# Patient Record
Sex: Female | Born: 1970
Health system: Southern US, Community
[De-identification: ages and names within clinical notes are randomized; demographics above are authoritative.]

## PROBLEM LIST (undated history)

## (undated) DIAGNOSIS — F319 Bipolar disorder, unspecified: Secondary | ICD-10-CM

## (undated) DIAGNOSIS — F259 Schizoaffective disorder, unspecified: Secondary | ICD-10-CM

## (undated) DIAGNOSIS — G2581 Restless legs syndrome: Secondary | ICD-10-CM

## (undated) HISTORY — DX: Schizoaffective disorder, unspecified: F25.9

## (undated) HISTORY — PX: APPENDECTOMY: SHX54

---

## 2001-07-17 ENCOUNTER — Other Ambulatory Visit: Admission: RE | Admit: 2001-07-17 | Discharge: 2001-07-17 | Payer: Self-pay | Admitting: Obstetrics and Gynecology

## 2006-10-18 ENCOUNTER — Inpatient Hospital Stay: Payer: Self-pay | Admitting: Psychiatry

## 2008-04-11 ENCOUNTER — Emergency Department: Payer: Self-pay | Admitting: Emergency Medicine

## 2009-09-21 ENCOUNTER — Emergency Department: Payer: Self-pay | Admitting: Emergency Medicine

## 2009-09-22 ENCOUNTER — Inpatient Hospital Stay: Payer: Self-pay | Admitting: Psychiatry

## 2012-03-11 ENCOUNTER — Emergency Department: Payer: Self-pay | Admitting: Emergency Medicine

## 2012-03-11 LAB — COMPREHENSIVE METABOLIC PANEL
Albumin: 4.3 g/dL (ref 3.4–5.0)
Anion Gap: 7 (ref 7–16)
Bilirubin,Total: 0.3 mg/dL (ref 0.2–1.0)
Chloride: 109 mmol/L — ABNORMAL HIGH (ref 98–107)
Creatinine: 0.89 mg/dL (ref 0.60–1.30)
EGFR (African American): >60
Osmolality: 276 (ref 275–301)
Potassium: 3.8 mmol/L (ref 3.5–5.1)
SGOT(AST): 24 U/L (ref 15–37)
Sodium: 139 mmol/L (ref 136–145)
Total Protein: 8.3 g/dL — ABNORMAL HIGH (ref 6.4–8.2)

## 2012-03-11 LAB — URINALYSIS, COMPLETE
Bilirubin,UR: NEGATIVE
Ketone: NEGATIVE
Protein: NEGATIVE
Squamous Epithelial: 4
WBC UR: <1 /HPF (ref 0–5)

## 2012-03-11 LAB — CBC
HGB: 15.7 g/dL (ref 12.0–16.0)
MCHC: 34.3 g/dL (ref 32.0–36.0)
MCV: 90 fL (ref 80–100)
Platelet: 370 10*3/uL (ref 150–440)
RBC: 5.08 10*6/uL (ref 3.80–5.20)
RDW: 13.6 % (ref 11.5–14.5)
WBC: 8.9 10*3/uL (ref 3.6–11.0)

## 2012-03-11 LAB — ETHANOL
Ethanol %: <0.003 % (ref 0.000–0.080)
Ethanol: <3 mg/dL

## 2012-03-11 LAB — DRUG SCREEN, URINE
Amphetamines, Ur Screen: NEGATIVE (ref ?–1000)
Barbiturates, Ur Screen: NEGATIVE (ref ?–200)
Cocaine Metabolite,Ur ~~LOC~~: NEGATIVE (ref ?–300)
Methadone, Ur Screen: NEGATIVE (ref ?–300)
Opiate, Ur Screen: NEGATIVE (ref ?–300)
Tricyclic, Ur Screen: NEGATIVE (ref ?–1000)

## 2012-03-11 LAB — TSH: Thyroid Stimulating Horm: 2.71 u[IU]/mL

## 2012-03-11 LAB — LITHIUM LEVEL: Lithium: <0.2 mmol/L — ABNORMAL LOW

## 2013-08-22 ENCOUNTER — Inpatient Hospital Stay: Payer: Self-pay | Admitting: Psychiatry

## 2013-08-22 LAB — COMPREHENSIVE METABOLIC PANEL
ANION GAP: 5 — AB (ref 7–16)
Albumin: 3.9 g/dL (ref 3.4–5.0)
Alkaline Phosphatase: 81 U/L
BILIRUBIN TOTAL: 0.3 mg/dL (ref 0.2–1.0)
BUN: 10 mg/dL (ref 7–18)
CREATININE: 0.77 mg/dL (ref 0.60–1.30)
Calcium, Total: 9 mg/dL (ref 8.5–10.1)
Chloride: 108 mmol/L — ABNORMAL HIGH (ref 98–107)
Co2: 27 mmol/L (ref 21–32)
EGFR (Non-African Amer.): >60
Glucose: 117 mg/dL — ABNORMAL HIGH (ref 65–99)
OSMOLALITY: 279 (ref 275–301)
POTASSIUM: 3.9 mmol/L (ref 3.5–5.1)
SGOT(AST): 26 U/L (ref 15–37)
SGPT (ALT): 30 U/L (ref 12–78)
Sodium: 140 mmol/L (ref 136–145)
TOTAL PROTEIN: 7.7 g/dL (ref 6.4–8.2)

## 2013-08-22 LAB — CBC
HCT: 43.3 % (ref 35.0–47.0)
HGB: 14.6 g/dL (ref 12.0–16.0)
MCH: 30.4 pg (ref 26.0–34.0)
MCHC: 33.8 g/dL (ref 32.0–36.0)
MCV: 90 fL (ref 80–100)
PLATELETS: 340 10*3/uL (ref 150–440)
RBC: 4.82 10*6/uL (ref 3.80–5.20)
RDW: 13.7 % (ref 11.5–14.5)
WBC: 9.3 10*3/uL (ref 3.6–11.0)

## 2013-08-22 LAB — DRUG SCREEN, URINE
Amphetamines, Ur Screen: NEGATIVE
Barbiturates, Ur Screen: NEGATIVE
Benzodiazepine, Ur Scrn: NEGATIVE
Cannabinoid 50 Ng, Ur ~~LOC~~: NEGATIVE
Cocaine Metabolite,Ur ~~LOC~~: NEGATIVE
MDMA (Ecstasy)Ur Screen: NEGATIVE
Methadone, Ur Screen: NEGATIVE
Opiate, Ur Screen: NEGATIVE
Phencyclidine (PCP) Ur S: NEGATIVE
Tricyclic, Ur Screen: NEGATIVE

## 2013-08-22 LAB — TSH: Thyroid Stimulating Horm: 0.94 u[IU]/mL

## 2013-08-22 LAB — SALICYLATE LEVEL: Salicylates, Serum: 6.5 mg/dL — ABNORMAL HIGH

## 2013-08-22 LAB — ETHANOL: Ethanol %: <0.003 % (ref 0.000–0.080)

## 2013-08-22 LAB — ACETAMINOPHEN LEVEL: Acetaminophen: <2 ug/mL

## 2014-09-27 NOTE — Consult Note (Signed)
PATIENT NAMEPERNELL, [name] MR#:  [id number] DATE OF BIRTH:  07/11/1970  DATE OF CONSULTATION:  08/22/2013  CONSULTING PHYSICIAN:  Jewelz Kobus S. Gretel Acre, MD  REQUESTED BY:  Brenton Grills, MD  REASON FOR CONSULT: Not sleeping for the past 2 days.   HISTORY OF PRESENT ILLNESS: The patient is a 44 year old, married female who presented to the ER at the request of Dr. Nicolasa Ducking, who is her outpatient psychiatrist, as patient has not been sleeping for the past 2 days. She reported that she has been following Dr. Nicolasa Ducking for the past one year. She has history of undisclosed PTSD and undisclosed episodic psychotic disorder. During my interview, patient reported that she has been having problems with sleeping, as she was thinking about different situations and things she created. She reported that she has been feeling depressed, and has some irrational drama, which she has created. However, she was not able to participate much in the interview, and did not provide much information.   Collateral information was obtained from Dr. Nicolasa Ducking. She reported that patient has history of several hospitalizations in the past. She has been working at [employer] for several years, and then she had a traumatic experience over there. She was diagnosed with PTSD due to the same. After that, she has mild paranoia and she was unable to continue her job. The patient stabilized on Seroquel in the past, but the medication was stopped as patient was doing very well on the medication. After patient stopped the Seroquel, she started having worsening of her symptoms. Dr. Nicolasa Ducking stabilized her on lamotrigine and Celexa, but patient started becoming more paranoid, not able to sleep, and her family is very supportive. The patient feels that she is part of a reality TV show at this time. She is becoming more paranoid, delusional, and having negative thoughts about herself. Her family is not disappointed about her, but patient has negative feelings and  she is becoming more restless. She is unable to contract for safety.   PAST PSYCHIATRIC HISTORY: The patient has history of multiple psychiatric hospitalizations starting at age 48. Her second hospitalization was in 2008, when she was given a combination of lithium, Depakote and Abilify. She was diagnosed with bipolar disorder. She was also admitted to [facility] when she tried to kill herself by wrapping a sheet around her neck. She was also tried on a combination of Trileptal, Zyprexa, Depakote and she responded well to a combination of Seroquel. She is currently following Dr. Nicolasa Ducking for the past one year.   PAST MEDICAL HISTORY: Restless legs syndrome.   ALLERGIES: No known drug allergies.   FAMILY PSYCHIATRIC HISTORY: No history of psychiatric illness in the family.   CURRENT MEDICATIONS:  1.  Lamotrigine XR 300 mg daily.  2.  Celexa 20 mg p.o. daily.   SOCIAL HISTORY: The patient is currently married and lives with her husband. He is very supportive. She is currently unemployed.   FAMILY HISTORY: Not known.   CLINICAL SUMMARY: Temperature 97.9, pulse 101, respirations 22, blood pressure 121/58.  LABORATORIES: Glucose 117, BUN 10, creatinine 0.77, sodium 140, potassium 3.9, chloride 108, bicarbonate 27, anion gap 5, osmolality 279, calcium 9.0. Blood alcohol level less than 3. Protein 7.7, albumin 3.9, bilirubin 0.3, alkaline phosphatase 81, AST 26, ALT 30. TSH 0.94. UDS is negative. WBC 9.3, RBC 4.82, hemoglobin 14.6, hematocrit 43.3, MCV 90, RDW 13.7.    REVIEW OF SYSTEMS: CONSTITUTIONAL: Denies any fever or chills. No weight changes.  EYES: No  double or blurred vision.  RESPIRATORY: No shortness of breath or cough.  CARDIOVASCULAR: No chest pain or orthopnea.  GASTROINTESTINAL: No abdominal pain, nausea, vomiting or diarrhea.  GENITOURINARY: No incontinence or frequency.  ENDOCRINE: No heat or cold intolerance.  LYMPHATIC: No anemia or easy bruising.  INTEGUMENTARY: No acne or  rash.  MUSCULOSKELETAL: No muscle or joint pain.  NEUROLOGIC: No tingling or weakness.   MENTAL STATUS EXAMINATION: The patient is a moderately-built female who was lying in the bed. She was hiding her face under the cover. She maintained fair eye contact. Her speech was low in tone and volume. Mood was depressed and anxious. Affect was congruent. Thought process tangential. Thought content was non-delusional. She has poor insight and judgment. She did not participate much in the interview. She was unable to contract for safety at this time.   DIAGNOSTIC IMPRESSION: AXIS I: Schizoaffective disorder, bipolar type.  AXIS II: None.  AXIS III: Please review the medical history.   TREATMENT PLAN: 1.  The patient is currently on involuntary commitment, and will be admitted to the behavioral health unit for stabilization and safety.   2.  I have discussed her case with Dr. Nicolasa Ducking, and will start her on Zyprexa 5 mg p.o. at bedtime.   3.  I will also continue the lamotrigine 100 mg p.o. b.i.d.   4.  She will be evaluated by Dr. Bary Leriche,  and her medications will be adjusted.   Thank you for allowing me to participate in the care of this patient.     ____________________________ Cordelia Pen. Gretel Acre, MD usf:mr D: 08/22/2013 15:39:00 ET T: 08/22/2013 19:05:07 ET JOB#: [id number]  cc: Cordelia Pen. Gretel Acre, MD, <Dictator> Jeronimo Norma MD ELECTRONICALLY SIGNED 08/27/2013 14:00

## 2014-09-27 NOTE — H&P (Signed)
PATIENT NAMELASHAWNTA, [name] MR#:  [id number] DATE OF BIRTH:  08-25-1970  DATE OF ADMISSION:  08/22/2013  EVALUATED ON: 08/23/2013   REFERRING PHYSICIAN: Emergency Room M.D.   ATTENDING PHYSICIAN: [name] B. Bary Leriche, M.D.   IDENTIFYING DATA: [name] is a 44 year old female with history of bipolar disorder.   CHIEF COMPLAINT: " I am losing the house."   HISTORY OF PRESENT ILLNESS: [name] has frequent episodes of psychotic disorganization and despair as a part of her bipolar illness. She has been in the care of Dr. Nicolasa Ducking and maintained on Lamictal with excellent results; however, in the past week or so, the patient again became increasingly paranoid and disorganized. She fears that her husband is going to die, that the family is going to lose her house. She is crying that [facility] is a group home and this is where she will have to move if we will not discharge her to home immediately. She feels that she put all of her family in trouble, that she does some shameful things for which everybody is now going to pay a price. The patient presented several times with exactly the same scenario to [facility] as well as [facility]. She is extremely tearful, guilt ridden and unable to function. She has not been able to sleep. She has been brought to the hospital by her husband who has been very supportive. She denies any symptoms whatsoever. She does not recognize that she is sick. She denies alcohol or illicit substance use.   PAST PSYCHIATRIC HISTORY: Seven hospitalizations with similar presentation. She was given diagnosis of major depressive disorder with psychotic features as well as bipolar disorder, depressed with psychosis. She has been tried on multiple medications; lithium, Depakote, Abilify, Tegretol, Requip, Trilafon she did well on, Lamictal. She cannot tolerate Geodon or Seroquel. There were no suicide attempts.   FAMILY PSYCHIATRIC HISTORY: None reported.   PAST MEDICAL  HISTORY: Restless legs.   ALLERGIES: No known drug allergies.   MEDICATIONS ON ADMISSION: Lamictal XR 300 mg daily, Celexa 20 mg daily.   SOCIAL HISTORY: She is married. She lives with her husband. She has 2 adult children. She lost multiple employment opportunities when she becomes paranoid, twice at [employer] and at [employer]. She is not employed at the moment. She oftentimes had to switch treatments due to loss of insurance. Her husband owns a business and so far has been able to support her.   REVIEW OF SYSTEMS:  CONSTITUTIONAL: No fevers or chills. No weight changes.  EYES: No double or blurred vision.  ENT: No hearing loss.  RESPIRATORY: No shortness of breath or cough.  CARDIOVASCULAR: No chest pain or orthopnea.  GASTROINTESTINAL: No abdominal pain, nausea, vomiting or diarrhea.  GENITOURINARY: No incontinence or frequency.  ENDOCRINE: No heat or cold intolerance.  LYMPHATIC: No anemia or easy bruising.  INTEGUMENTARY: No acne or rash.  MUSCULOSKELETAL: No muscle or joint pain.  NEUROLOGIC: No tingling or weakness.  PSYCHIATRIC: See history of present illness for details.   PHYSICAL EXAMINATION:  VITAL SIGNS: Blood pressure 136/82, pulse 87, respirations 18, temperature 99.  GENERAL: This is a slightly obese female, restless.   HEENT: The pupils are equal, round and reactive to light. Sclerae anicteric.  NECK: Supple. No thyromegaly.  LUNGS: Clear to auscultation. No dullness to percussion.  HEART: Regular rhythm and rate. No murmurs, rubs or gallops.  ABDOMEN: Soft, nontender and nondistended. Positive bowel sounds.  MUSCULOSKELETAL: Normal muscle strength in all extremities.  SKIN: No rashes or bruises.  LYMPHATIC: No cervical adenopathy.  NEUROLOGIC: Cranial nerves II through XII are intact.   LABORATORY DATA: Chemistries within normal limits with blood glucose of 117. Blood alcohol level is 0. LFTs within normal limits. TSH 0.94. Urine tox screen negative for substances.  CBC within normal limits. Serum acetaminophen and salicylates are low.   MENTAL STATUS EXAMINATION ON ADMISSION: The patient is alert and oriented to person, place and somewhat to situation. She is in despair. She is crying, sobbing. Demanding to be discharged. She is restless, pacing continuously in the hallways and also in my office, unable to sit down. She slept 1 hour last night. She maintains fairly good eye contact. She is marginally groomed. Her speech is loud at times. Mood is depressed with labile affect. Thought process is not logical. She denies suicidal or homicidal ideation but feels hopeless and in despair, extremely guilty. There are no thoughts of hurting others. She is paranoid and delusional. She denies hallucinations. Her cognition is grossly intact. She is not a good historian at the moment due to psychotic disorganization. She seems of normal intelligence and normal fund of knowledge. Her insight and judgment are currently extremely poor.   SUICIDE RISK ASSESSMENT ON ADMISSION: This is a patient with history of depression, psychosis, mood instability, treatment noncompliance who became floridly psychotic in spite of recent medication adjustment and possibly fair medication compliance.   DIAGNOSES:  AXIS I: Bipolar affective disorder, mixed with psychotic features.  AXIS II: Deferred.  AXIS III: Restless legs.  AXIS IV: Mental illness.  AXIS V: Global assessment of functioning on admission 25.   PLAN: The patient was admitted to [facility] for safety, stabilization and medication management. She was initially placed on suicide precautions and was closely monitored for any unsafe behaviors. She underwent full psychiatric and risk assessment. She received pharmacotherapy, individual and group psychotherapy, substance abuse counseling and support from therapeutic milieu.  1. Mood and psychosis: We will continue Lamictal. We started Zyprexa  last night and will increase the dose potentially to 20 mg daily, and we will offer temazepam for sleep.  2. Restless legs: The patient is really struggling. I hope that her restlessness does not come from Zyprexa. Will offer propranolol and also low-dose Requip that was helpful in the past; however, at some point, the patient was taking 5 mg of Requip and we were worried that this could have precipitated a manic episode.  3. Insomnia: We will start Restoril.  4. Disposition: She will be discharged to home.    ____________________________ Wardell Honour. Bary Leriche, MD jbp:gb D: 08/23/2013 22:07:57 ET T: 08/23/2013 22:24:50 ET JOB#: [id number]  cc: Sharry Beining B. Bary Leriche, MD, <Dictator> Clovis Fredrickson MD ELECTRONICALLY SIGNED 08/28/2013 7:34

## 2014-12-17 ENCOUNTER — Emergency Department
Admission: EM | Admit: 2014-12-17 | Discharge: 2014-12-18 | Disposition: A | Discharge status: Other Acute Inpatient Hospital | Payer: Self-pay | Attending: Emergency Medicine | Admitting: Emergency Medicine

## 2014-12-17 ENCOUNTER — Encounter: Payer: Self-pay | Admitting: Emergency Medicine

## 2014-12-17 DIAGNOSIS — X58XXXA Exposure to other specified factors, initial encounter: Secondary | ICD-10-CM | POA: Insufficient documentation

## 2014-12-17 DIAGNOSIS — T50902A Poisoning by unspecified drugs, medicaments and biological substances, intentional self-harm, initial encounter: Secondary | ICD-10-CM

## 2014-12-17 DIAGNOSIS — Y9289 Other specified places as the place of occurrence of the external cause: Secondary | ICD-10-CM | POA: Insufficient documentation

## 2014-12-17 DIAGNOSIS — Z3202 Encounter for pregnancy test, result negative: Secondary | ICD-10-CM | POA: Insufficient documentation

## 2014-12-17 DIAGNOSIS — G2571 Drug induced akathisia: Secondary | ICD-10-CM

## 2014-12-17 DIAGNOSIS — Y998 Other external cause status: Secondary | ICD-10-CM | POA: Insufficient documentation

## 2014-12-17 DIAGNOSIS — Y9389 Activity, other specified: Secondary | ICD-10-CM | POA: Insufficient documentation

## 2014-12-17 DIAGNOSIS — Z720 Tobacco use: Secondary | ICD-10-CM | POA: Insufficient documentation

## 2014-12-17 DIAGNOSIS — T43592A Poisoning by other antipsychotics and neuroleptics, intentional self-harm, initial encounter: Secondary | ICD-10-CM | POA: Insufficient documentation

## 2014-12-17 DIAGNOSIS — F312 Bipolar disorder, current episode manic severe with psychotic features: Secondary | ICD-10-CM | POA: Insufficient documentation

## 2014-12-17 DIAGNOSIS — G2581 Restless legs syndrome: Secondary | ICD-10-CM

## 2014-12-17 DIAGNOSIS — T43595A Adverse effect of other antipsychotics and neuroleptics, initial encounter: Secondary | ICD-10-CM | POA: Insufficient documentation

## 2014-12-17 HISTORY — DX: Drug induced akathisia: G25.71

## 2014-12-17 HISTORY — DX: Restless legs syndrome: G25.81

## 2014-12-17 HISTORY — DX: Bipolar disorder, unspecified: F31.9

## 2014-12-17 LAB — URINALYSIS COMPLETE WITH MICROSCOPIC (ARMC ONLY)
Bacteria, UA: NONE SEEN
Bilirubin Urine: NEGATIVE
GLUCOSE, UA: NEGATIVE mg/dL
Ketones, ur: NEGATIVE mg/dL
Leukocytes, UA: NEGATIVE
Nitrite: NEGATIVE
PROTEIN: NEGATIVE mg/dL
SPECIFIC GRAVITY, URINE: 1.008 (ref 1.005–1.030)
pH: 5 (ref 5.0–8.0)

## 2014-12-17 LAB — CK: Total CK: 53 U/L (ref 38–234)

## 2014-12-17 LAB — COMPREHENSIVE METABOLIC PANEL
ALBUMIN: 4.1 g/dL (ref 3.5–5.0)
ALK PHOS: 70 U/L (ref 38–126)
ALT: 24 U/L (ref 14–54)
AST: 25 U/L (ref 15–41)
Anion gap: 5 (ref 5–15)
BUN: 12 mg/dL (ref 6–20)
CO2: 24 mmol/L (ref 22–32)
CREATININE: 0.78 mg/dL (ref 0.44–1.00)
Calcium: 8.9 mg/dL (ref 8.9–10.3)
Chloride: 112 mmol/L — ABNORMAL HIGH (ref 101–111)
GFR calc Af Amer: >60 mL/min (ref >60)
GLUCOSE: 126 mg/dL — AB (ref 65–99)
Potassium: 3.3 mmol/L — ABNORMAL LOW (ref 3.5–5.1)
Sodium: 141 mmol/L (ref 135–145)
Total Bilirubin: 0.2 mg/dL — ABNORMAL LOW (ref 0.3–1.2)
Total Protein: 7.4 g/dL (ref 6.5–8.1)

## 2014-12-17 LAB — CBC
HCT: 41.3 % (ref 35.0–47.0)
Hemoglobin: 13.6 g/dL (ref 12.0–16.0)
MCH: 30.1 pg (ref 26.0–34.0)
MCHC: 33 g/dL (ref 32.0–36.0)
MCV: 91.1 fL (ref 80.0–100.0)
Platelets: 318 10*3/uL (ref 150–440)
RBC: 4.54 MIL/uL (ref 3.80–5.20)
RDW: 13.6 % (ref 11.5–14.5)
WBC: 11.1 10*3/uL — ABNORMAL HIGH (ref 3.6–11.0)

## 2014-12-17 LAB — URINE DRUG SCREEN, QUALITATIVE (ARMC ONLY)
Amphetamines, Ur Screen: NOT DETECTED
Barbiturates, Ur Screen: NOT DETECTED
Benzodiazepine, Ur Scrn: NOT DETECTED
Cannabinoid 50 Ng, Ur ~~LOC~~: NOT DETECTED
Cocaine Metabolite,Ur ~~LOC~~: NOT DETECTED
MDMA (ECSTASY) UR SCREEN: NOT DETECTED
Methadone Scn, Ur: NOT DETECTED
OPIATE, UR SCREEN: NOT DETECTED
Phencyclidine (PCP) Ur S: NOT DETECTED
Tricyclic, Ur Screen: NOT DETECTED

## 2014-12-17 LAB — TROPONIN I: Troponin I: <0.03 ng/mL (ref <0.031)

## 2014-12-17 LAB — PREGNANCY, URINE: Preg Test, Ur: NEGATIVE

## 2014-12-17 LAB — ACETAMINOPHEN LEVEL: Acetaminophen (Tylenol), Serum: <10 ug/mL — ABNORMAL LOW (ref 10–30)

## 2014-12-17 LAB — SALICYLATE LEVEL

## 2014-12-17 MED ORDER — LORAZEPAM 2 MG PO TABS
2.0000 mg | ORAL_TABLET | ORAL | Status: AC
  Administered 2014-12-17: 2 mg via ORAL
  Filled 2014-12-17: qty 1

## 2014-12-17 MED ORDER — LORAZEPAM 2 MG/ML IJ SOLN
1.0000 mg | Freq: Once | INTRAMUSCULAR | Status: AC
  Administered 2014-12-17: 1 mg via INTRAVENOUS

## 2014-12-17 MED ORDER — LORAZEPAM 2 MG/ML IJ SOLN
INTRAMUSCULAR | Status: AC
  Administered 2014-12-17: 1 mg via INTRAVENOUS
  Filled 2014-12-17: qty 1

## 2014-12-17 MED ORDER — DIPHENHYDRAMINE HCL 50 MG/ML IJ SOLN
50.0000 mg | Freq: Once | INTRAMUSCULAR | Status: AC
  Administered 2014-12-17: 50 mg via INTRAVENOUS

## 2014-12-17 MED ORDER — SODIUM CHLORIDE 0.9 % IV BOLUS (SEPSIS)
1000.0000 mL | Freq: Once | INTRAVENOUS | Status: AC
  Administered 2014-12-17: 1000 mL via INTRAVENOUS

## 2014-12-17 MED ORDER — ZIPRASIDONE HCL 20 MG PO CAPS
ORAL_CAPSULE | ORAL | Status: AC
  Administered 2014-12-17: 20 mg via ORAL
  Filled 2014-12-17: qty 1

## 2014-12-17 MED ORDER — DIPHENHYDRAMINE HCL 50 MG/ML IJ SOLN
INTRAMUSCULAR | Status: AC
  Administered 2014-12-17: 50 mg via INTRAVENOUS
  Filled 2014-12-17: qty 1

## 2014-12-17 MED ORDER — DIPHENHYDRAMINE HCL 50 MG PO CAPS
50.0000 mg | ORAL_CAPSULE | ORAL | Status: AC
  Administered 2014-12-17: 50 mg via ORAL
  Filled 2014-12-17: qty 1

## 2014-12-17 MED ORDER — ZIPRASIDONE HCL 20 MG PO CAPS
20.0000 mg | ORAL_CAPSULE | Freq: Once | ORAL | Status: AC
  Administered 2014-12-17: 20 mg via ORAL

## 2014-12-17 NOTE — ED Notes (Signed)
Pt presents to ER alert and agitated. Husband brought pt to ER for OD on Zyprexa and flight of ideas, as well as "jibber jabber" speech. Pt is restless, unable to sit still. Pt denies SI.

## 2014-12-17 NOTE — ED Notes (Signed)
Sitter with pt.  Pt resting with eyes closed.  siderails up x 2.  Pt waiting on admission to beh med unit.

## 2014-12-17 NOTE — ED Notes (Signed)
ENVIRONMENTAL ASSESSMENT Potentially harmful objects out of patient reach: No. Personal belongings secured: Yes.   Patient dressed in hospital provided attire only: Yes.   Plastic bags out of patient reach: Yes.   Patient care equipment (cords, cables, call bells, lines, and drains) shortened, removed, or accounted for: Yes.   Equipment and supplies removed from bottom of stretcher: Yes.   Potentially toxic materials out of patient reach: Yes.   Sharps container removed or out of patient reach: Yes.

## 2014-12-17 NOTE — ED Notes (Signed)

## 2014-12-17 NOTE — ED Notes (Signed)
Unable to obtain EKG due to pt constantly moving, standing up and unable to tolerate laying in bed.

## 2014-12-17 NOTE — ED Notes (Addendum)
BEHAVIORAL HEALTH ROUNDING Patient sleeping: NO Patient alert and oriented: YES Behavior appropriate: YES Describe behavior: No inappropriate or unacceptable behaviors noted at this time.  Nutrition and fluids offered: YES Toileting and hygiene offered: YES Sitter present:Yes, 1:1 sitter present Event organiser present: Programmer, applications agency: [place] ([employer])

## 2014-12-17 NOTE — ED Provider Notes (Addendum)
-----------------------------------------   4:16 PM on 12/17/2014 -----------------------------------------  Patient has been seen and evaluated by psychiatry, they will admit to their service for further workup and treatment.  Harvest Dark, MD 12/17/14 [place], MD 12/17/14 [phone]

## 2014-12-17 NOTE — ED Notes (Signed)
Patient resting with eyes closed in bed, lights turned off for patient comfort. Respirations even and unlabored, skin warm and dry.

## 2014-12-17 NOTE — ED Notes (Signed)
BEHAVIORAL HEALTH ROUNDING Patient sleeping: Yes.   Patient alert and oriented: no Behavior appropriate: Yes.   Nutrition and fluids offered: Yes  Toileting and hygiene offered: Yes  Sitter present: yes Law enforcement present: Yes

## 2014-12-17 NOTE — ED Notes (Signed)
Sitter states pt ate 50% of lunch, pt resting in bed quietly with eyes closed

## 2014-12-17 NOTE — B[facility] Counselor (Signed)
Late Entry----- Pt. is to be admitted to [facility] by Dr. Weber Cooks. Attending Physician will be Dr. Jerilee Hoh.  Pt. has been assigned to room 309, by [place].  Intake Paper Work has been signed and placed on pt. chart.   Patient will be admitted/transfered to [facility] tomorrow 12/18/2014, due to staffing. Patient is requiring a One-on-One and their are several other patients on the unit with One-on-Ones at this time. She initially was going to be in room 315 but is reassigned to room 309 to be closer to the nurses due to her fall risk.  ER staff Chaya Jan, ER Charge Nurse & Dr. Drucilla Schmidt MD). have been made aware of the status of the admission.

## 2014-12-17 NOTE — ED Notes (Signed)
This RN and Dr. Owens Shark out to speak with patient's husband about presenting c/o. Husband shared that patient under the care of Dr. Nicolasa Ducking - has an appointment this morning at 0900. Dr. Owens Shark advised husband that patient would be staying and Dr. Nicolasa Ducking likely to want her admitted; husband leaving at this time.

## 2014-12-17 NOTE — ED Notes (Signed)
BEHAVIORAL HEALTH ROUNDING Patient sleeping: Yes.   Patient alert and oriented: yes Behavior appropriate: Yes.  ; If no, describe:  Nutrition and fluids offered: Yes  Toileting and hygiene offered: Yes  Sitter present: yes Law enforcement present: Yes  

## 2014-12-17 NOTE — ED Notes (Signed)
BEHAVIORAL HEALTH ROUNDING Patient sleeping: Yes.   Patient alert and oriented: not applicable Behavior appropriate: Yes.   Nutrition and fluids offered: Yes  Toileting and hygiene offered: Yes  Sitter present: yes Law enforcement present: Yes  

## 2014-12-17 NOTE — ED Notes (Signed)
1on1 sitting with pt.  Pt calm and resting quietly in bed at this time.

## 2014-12-17 NOTE — Consult Note (Signed)
[facility] Consult   Reason for Consult:  Consult for 44 year old woman with a history of bipolar disorder who presents after taking an excessive number of her anti-psychotic Referring Physician:  Archie Balboa Patient Identification: [name] MRN:  [id number] Principal Diagnosis: Bipolar disorder (manic depression) Diagnosis:   Patient Active Problem List   Diagnosis Date Noted  . Bipolar disorder (manic depression) [F31.9] 12/17/2014  . Restless legs [G25.81] 12/17/2014  . Akathisia [R45.0] 12/17/2014    Total Time spent with patient: 1 hour  Subjective:   [name] is a 44 y.o. female patient admitted with patient reports "I'm not doing well." Reports agitation and poor sleep and is mostly complaining of discomfort in her legs.Marland Kitchen  HPI:  Information from the patient and the chart as well as conversation with Dr. Nicolasa Ducking who is the patient's outpatient psychiatrist. Patient presented to the emergency room last night after taking an excessive number of her Zyprexa probably just about 3 or 4 of them. She got even more agitated at that point and according to the patient she wanted to come to the emergency room. She tells me that she's been off of her medicine probably for more than a week. She doesn't have a really good excuse for that. She says that she's been drinking but her history on this point is hard to credit and not very detailed. She tells me that her mood is been bad and she is feeling bad. She is otherwise not a very good historian. Dr. Nicolasa Ducking called me this morning to tell me the patient was coming to the hospital. She knew that the patient had a history of decompensating into psychotic manic symptoms and of intermittent noncompliance.  Past psychiatric history: History of bipolar disorder versus psychotic depression. Has had periodic decompensations often related to medicine noncompliance. It looks like her last hospitalization was about a year and a half ago. No  known history of suicide attempts. Has been on multiple anti-psychotics in the past with best response to Zyprexa and lamotrigine which is her current combination.  Substance abuse history: Patient says that she's been drinking but her history is unreliable from what I can tell. She denies any other drug use. There is not a clear past history of substance abuse.  Social history: Patient is married lives with her husband. Husband evidently owns a business of some sort. Dr. Nicolasa Ducking tells me that she has seen the patient be stable and her family relationships before.  Medical history: Patient has a history of restless leg syndrome for which she has been prescribed Requip in the past. There was some question in the past about whether the Requip could have triggered some of her psychotic symptoms. It's also been not entirely clear whether the restless legs could be actually akathisia.  Family history: She is unable to give me any detail.  Medication at home Zyprexa 20 mg daily at bedtime, lamotrigine 200 mg daily HPI Elements:   Quality:  Agitation and thought disorganization bizarre and unpredictable behavior. Severity:  Severe enough to potentially lead to life threatening consequences. Timing:  Seems to be getting worse over probably the last week or so. Came into the hospital last night. Duration:  Chronic illness. Context:  Recent medicine noncompliance.  Past Medical History:  Past Medical History  Diagnosis Date  . Bipolar affective   . Psychotic affective disorder   . Restless leg syndrome    History reviewed. No pertinent past surgical history. Family History: History reviewed. No  pertinent family history. Social History:  History  Alcohol Use No     History  Drug Use Not on file    History   Social History  . Marital Status: Unknown    Spouse Name: N/A  . Number of Children: N/A  . Years of Education: N/A   Social History Main Topics  . Smoking status: Current Every Day  Smoker  . Smokeless tobacco: Not on file  . Alcohol Use: No  . Drug Use: Not on file  . Sexual Activity: Not on file   Other Topics Concern  . None   Social History Narrative  . None   Additional Social History:    Pain Medications: See MARs Prescriptions: See MARs Over the Counter: See MARs History of alcohol / drug use?: No history of alcohol / drug abuse                     Allergies:  No Known Allergies  Labs:  Results for orders placed or performed during the hospital encounter of 12/17/14 (from the past 48 hour(s))  Urine Drug Screen, Qualitative ([place] only)     Status: None   Collection Time: 12/17/14  4:42 AM  Result Value Ref Range   Tricyclic, Ur Screen NONE DETECTED NONE DETECTED   Amphetamines, Ur Screen NONE DETECTED NONE DETECTED   MDMA (Ecstasy)Ur Screen NONE DETECTED NONE DETECTED   Cocaine Metabolite,Ur [place] NONE DETECTED NONE DETECTED   Opiate, Ur Screen NONE DETECTED NONE DETECTED   Phencyclidine (PCP) Ur S NONE DETECTED NONE DETECTED   Cannabinoid 50 Ng, Ur  NONE DETECTED NONE DETECTED   Barbiturates, Ur Screen NONE DETECTED NONE DETECTED   Benzodiazepine, Ur Scrn NONE DETECTED NONE DETECTED   Methadone Scn, Ur NONE DETECTED NONE DETECTED    Comment: (NOTE) 242  Tricyclics, urine               Cutoff 1000 ng/mL 200  Amphetamines, urine             Cutoff 1000 ng/mL 300  MDMA (Ecstasy), urine           Cutoff 500 ng/mL 400  Cocaine Metabolite, urine       Cutoff 300 ng/mL 500  Opiate, urine                   Cutoff 300 ng/mL 600  Phencyclidine (PCP), urine      Cutoff 25 ng/mL 700  Cannabinoid, urine              Cutoff 50 ng/mL 800  Barbiturates, urine             Cutoff 200 ng/mL 900  Benzodiazepine, urine           Cutoff 200 ng/mL 1000 Methadone, urine                Cutoff 300 ng/mL 1100 1200 The urine drug screen provides only a preliminary, unconfirmed 1300 analytical test result and should not be used for non-medical 1400 purposes.  Clinical consideration and professional judgment should 1500 be applied to any positive drug screen result due to possible 1600 interfering substances. A more specific alternate chemical method 1700 must be used in order to obtain a confirmed analytical result.  1800 Gas chromato graphy / mass spectrometry (GC/MS) is the preferred 1900 confirmatory method.   Urinalysis complete, with microscopic ([facility] only)     Status: Abnormal   Collection Time: 12/17/14  4:42 AM  Result Value Ref Range   Color, Urine STRAW (A) YELLOW   APPearance CLEAR (A) CLEAR   Glucose, UA NEGATIVE NEGATIVE mg/dL   Bilirubin Urine NEGATIVE NEGATIVE   Ketones, ur NEGATIVE NEGATIVE mg/dL   Specific Gravity, Urine 1.008 1.005 - 1.030   Hgb urine dipstick 1+ (A) NEGATIVE   pH 5.0 5.0 - 8.0   Protein, ur NEGATIVE NEGATIVE mg/dL   Nitrite NEGATIVE NEGATIVE   Leukocytes, UA NEGATIVE NEGATIVE   RBC / HPF 0-5 0 - 5 RBC/hpf   WBC, UA 0-5 0 - 5 WBC/hpf   Bacteria, UA NONE SEEN NONE SEEN   Squamous Epithelial / LPF 0-5 (A) NONE SEEN   Mucous PRESENT   Pregnancy, urine     Status: None   Collection Time: 12/17/14  4:42 AM  Result Value Ref Range   Preg Test, Ur NEGATIVE NEGATIVE  CBC     Status: Abnormal   Collection Time: 12/17/14  4:47 AM  Result Value Ref Range   WBC 11.1 (H) 3.6 - 11.0 K/uL   RBC 4.54 3.80 - 5.20 MIL/uL   Hemoglobin 13.6 12.0 - 16.0 g/dL   HCT 41.3 35.0 - 47.0 %   MCV 91.1 80.0 - 100.0 fL   MCH 30.1 26.0 - 34.0 pg   MCHC 33.0 32.0 - 36.0 g/dL   RDW 13.6 11.5 - 14.5 %   Platelets 318 150 - 440 K/uL  Comprehensive metabolic panel     Status: Abnormal   Collection Time: 12/17/14  4:47 AM  Result Value Ref Range   Sodium 141 135 - 145 mmol/L   Potassium 3.3 (L) 3.5 - 5.1 mmol/L   Chloride 112 (H) 101 - 111 mmol/L   CO2 24 22 - 32 mmol/L   Glucose, Bld 126 (H) 65 - 99 mg/dL   BUN 12 6 - 20 mg/dL   Creatinine, Ser 0.78 0.44 - 1.00 mg/dL   Calcium 8.9 8.9 - 10.3 mg/dL   Total Protein 7.4  6.5 - 8.1 g/dL   Albumin 4.1 3.5 - 5.0 g/dL   AST 25 15 - 41 U/L   ALT 24 14 - 54 U/L   Alkaline Phosphatase 70 38 - 126 U/L   Total Bilirubin 0.2 (L) 0.3 - 1.2 mg/dL   GFR calc non Af Amer >60 >60 mL/min   GFR calc Af Amer >60 >60 mL/min    Comment: (NOTE) The eGFR has been calculated using the CKD EPI equation. This calculation has not been validated in all clinical situations. eGFR's persistently <60 mL/min signify possible Chronic Kidney Disease.    Anion gap 5 5 - 15  Acetaminophen level     Status: Abnormal   Collection Time: 12/17/14  4:47 AM  Result Value Ref Range   Acetaminophen (Tylenol), Serum <10 (L) 10 - 30 ug/mL    Comment:        THERAPEUTIC CONCENTRATIONS VARY SIGNIFICANTLY. A RANGE OF 10-30 ug/mL MAY BE AN EFFECTIVE CONCENTRATION FOR MANY PATIENTS. HOWEVER, SOME ARE BEST TREATED AT CONCENTRATIONS OUTSIDE THIS RANGE. ACETAMINOPHEN CONCENTRATIONS >150 ug/mL AT 4 HOURS AFTER INGESTION AND >50 ug/mL AT 12 HOURS AFTER INGESTION ARE OFTEN ASSOCIATED WITH TOXIC REACTIONS.   Salicylate level     Status: None   Collection Time: 12/17/14  4:47 AM  Result Value Ref Range   Salicylate Lvl <1.7 2.8 - 30.0 mg/dL  Troponin I     Status: None   Collection Time: 12/17/14  4:47 AM  Result Value Ref Range  Troponin I <0.03 <0.031 ng/mL    Comment:        NO INDICATION OF MYOCARDIAL INJURY.   CK     Status: None   Collection Time: 12/17/14  4:47 AM  Result Value Ref Range   Total CK 53 38 - 234 U/L    Vitals: Blood pressure 118/71, pulse 111, temperature 97.6 F (36.4 C), temperature source Oral, resp. rate 20, height 5' (1.524 m), weight 86.183 kg (190 lb), last menstrual period 12/17/2014, SpO2 98 %.  Risk to Self: Suicidal Ideation: No (unable to assess/per admission notes she denies SI) Is patient at risk for suicide?: No What has been your use of drugs/alcohol within the last 12 months?:  (unable to assess) Intentional Self Injurious Behavior: None (per  Dr. Nicolasa Ducking no history) Risk to Others: Homicidal Ideation:  (unable to assess) History of harm to others?:  (unable to assess, Per Dr. Nicolasa Ducking none ) Assessment of Violence: On admission Violent Behavior Description: erractic, pulling out IV,  Criminal Charges Pending?: No Prior Inpatient Therapy:   Prior Outpatient Therapy: Prior Outpatient Therapy: Yes Prior Therapy Dates: currently Prior Therapy Facilty/Provider(s): Dr. Nicolasa Ducking Reason for Treatment: Bipolar  Current Facility-Administered Medications  Medication Dose Route Frequency Provider Last Rate Last Dose  . diphenhydrAMINE (BENADRYL) capsule 50 mg  50 mg Oral STAT Gonzella Lex, MD      . LORazepam (ATIVAN) tablet 2 mg  2 mg Oral STAT Gonzella Lex, MD       Current Outpatient Prescriptions  Medication Sig Dispense Refill  . benztropine (COGENTIN) 0.5 MG tablet Take 0.5 mg by mouth 2 (two) times daily.    Marland Kitchen lamoTRIgine (LAMICTAL) 100 MG tablet Take 100-200 mg by mouth 2 (two) times daily. 1 tablet every morning and 2 tablets at bedtime    . OLANZapine (ZYPREXA) 20 MG tablet Take 20 mg by mouth at bedtime.      Musculoskeletal: Strength & Muscle Tone: Hard to judge. She is pacing and bouncing her legs around all the time. Gait & Station: normal Patient leans: N/A  Psychiatric Specialty Exam: Physical Exam  Constitutional: She appears well-developed and well-nourished. She appears lethargic.  HENT:  Head: Normocephalic and atraumatic.  Eyes: Conjunctivae are normal. Pupils are equal, round, and reactive to light.  Neck: Normal range of motion.  Cardiovascular: Normal heart sounds.   Respiratory: Effort normal.  GI: Soft.  Musculoskeletal: Normal range of motion.  Neurological: She appears lethargic.  Patient is restless and somewhat agitated. Can't sit still. Shaking her legs constantly. Probably having akathisia.  Skin: Skin is warm and dry.  Psychiatric: Her mood appears anxious. Her affect is blunt. Her speech is  rapid and/or pressured, tangential and slurred. She is agitated. Thought content is delusional. Cognition and memory are impaired. She expresses impulsivity. She exhibits abnormal recent memory. She is inattentive.    Review of Systems  Constitutional: Negative.   HENT: Negative.   Eyes: Negative.   Respiratory: Negative.   Cardiovascular: Negative.   Gastrointestinal: Negative.   Musculoskeletal: Negative.   Skin: Negative.   Neurological: Negative.   Psychiatric/Behavioral: Positive for memory loss. Negative for depression, suicidal ideas, hallucinations and substance abuse. The patient is nervous/anxious and has insomnia.     Blood pressure 118/71, pulse 111, temperature 97.6 F (36.4 C), temperature source Oral, resp. rate 20, height 5' (1.524 m), weight 86.183 kg (190 lb), last menstrual period 12/17/2014, SpO2 98 %.Body mass index is 37.11 kg/(m^2).  General Appearance: Bizarre and Disheveled  Eye Contact::  Poor  Speech:  Garbled and Pressured  Volume:  Decreased  Mood:  Anxious and Irritable  Affect:  Labile  Thought Process:  Disorganized  Orientation:  Full (Time, Place, and Person)  Thought Content:  It was hard to understand a lot of what she says and I'm not sure if some of it is delusional. She is grossly disorganized in her thinking and unable to stay on a topic  Suicidal Thoughts:  No  Homicidal Thoughts:  No  Memory:  Immediate;   Good Recent;   Poor Remote;   Fair  Judgement:  Impaired  Insight:  Lacking  Psychomotor Activity:  Restlessness  Concentration:  Poor  Recall:  Poor  Fund of Knowledge:Poor  Language: Poor  Akathisia:  Yes  Handed:  Right  AIMS (if indicated):     Assets:  Desire for Improvement Social Support  ADL's:  Intact  Cognition: Impaired,  Mild  Sleep:      Medical Decision Making: Review of Psycho-Social Stressors (1), Decision to obtain old records (1), Review and summation of old records (2), Established Problem, Worsening (2),  Review of Medication Regimen & Side Effects (2) and Review of New Medication or Change in Dosage (2)  Treatment Plan Summary: Medication management and Plan This is a patient with bipolar disorder with a history of decompensating into psychotic symptoms or presents to the emergency room after an impulsive overdose. Although it's doubtful that she was trying to kill her self it also appears that she is very disorganized in her thinking and unable to perform a rational plan. Patient is unable to take care of herself and is unpredictable. Not really safe to go home. Also at this point she is agitated and may be having akathisia. Its more typical of akathisia than of restless leg syndrome since she is still pacing and feeling uncomfortable even when standing up. I will give her some Ativan and Benadryl again right now and then put her on a low dose of Klonopin in addition to her Zyprexa and Lamictal. Information will be conveyed to her outpatient psychiatrist.  Plan:  Recommend psychiatric Inpatient admission when medically cleared. Disposition: Orders done to admit to the hospital. I will go ahead and fill out involuntary commitment as I don't think that she is capable of being cooperative right now.  John Clapacs 12/17/2014 1:56 PM

## 2014-12-17 NOTE — ED Notes (Signed)
Patient resting with eyes closed in bed, lights turned off for patient comfort. 1:1 sitter at bedside. NAD noted. Will continue to monitor patient.

## 2014-12-17 NOTE — ED Notes (Signed)
Patient awake, skin warm and dry, sitter at bedside. Denies pain at this time.

## 2014-12-17 NOTE — ED Notes (Signed)
Named was agnen on custody order, called magistrate thorpe informed her it should be [place], she stated to change it and she would change her paper.

## 2014-12-17 NOTE — ED Notes (Signed)
ENVIRONMENTAL ASSESSMENT Potentially harmful objects out of patient reach: YES Personal belongings secured: YES Patient dressed in hospital provided attire only: YES Plastic bags out of patient reach: YES Patient care equipment (cords, cables, call bells, lines, and drains) shortened, removed, or accounted for: NO 1:1 sitter at bedside Equipment and supplies removed from bottom of stretcher: NO 1:1 sitter at bedside Potentially toxic materials out of patient reach: [employer] container removed or out of patient reach: YES

## 2014-12-17 NOTE — ED Notes (Signed)
[phone] Husband leaving at this time.

## 2014-12-17 NOTE — ED Notes (Signed)
BEHAVIORAL HEALTH ROUNDING Patient sleeping: No. Patient alert and oriented: yes Behavior appropriate: Yes.  ; If no, describe:  Nutrition and fluids offered: Yes  Toileting and hygiene offered: Yes  Sitter present: yes Law enforcement present: Yes  

## 2014-12-17 NOTE — ED Notes (Signed)
This tech received report for [name]. This tech will continue to monitor.

## 2014-12-17 NOTE — ED Notes (Addendum)
BEHAVIORAL HEALTH ROUNDING Patient sleeping: YES Patient alert and oriented: YES Behavior appropriate: YES Describe behavior: No inappropriate or unacceptable behaviors noted at this time.  Nutrition and fluids offered: YES Toileting and hygiene offered: YES Sitter present: Yes, 1:1 sitter present Event organiser present: Programmer, applications agency: [employer]

## 2014-12-17 NOTE — ED Notes (Signed)
Pt suddenly sat up in bed speaking about getting chairs ready for a dinner party, pt became very agitated yelling about [place], MD at bedside, pt redirected

## 2014-12-17 NOTE — ED Notes (Signed)
BEHAVIORAL HEALTH ROUNDING Patient sleeping: Yes.   Patient alert and oriented: not applicable Behavior appropriate: Yes.   Nutrition and fluids offered: No, pt sleeping Toileting and hygiene offered: No, pt sleeping Sitter present: yes, 1:1 sitter Law enforcement present: Yes  and No  ENVIRONMENTAL ASSESSMENT Potentially harmful objects out of patient reach: Yes.   Personal belongings secured: Yes.   Patient dressed in hospital provided attire only: Yes.   Plastic bags out of patient reach: Yes.   Patient care equipment (cords, cables, call bells, lines, and drains) shortened, removed, or accounted for: Yes.   Equipment and supplies removed from bottom of stretcher: Yes.   Potentially toxic materials out of patient reach: Yes.   [employer] container removed or out of patient reach: Yes.

## 2014-12-17 NOTE — ED Notes (Signed)
BEHAVIORAL HEALTH ROUNDING Patient sleeping: Yes.   Patient alert and oriented: not applicable Behavior appropriate: Yes.   Nutrition and fluids offered: Yes  Toileting and hygiene offered: Yes  Sitter present: yes, 1:1 Law enforcement present: Yes

## 2014-12-17 NOTE — ED Provider Notes (Signed)
[facility] Emergency Department Provider Note  ____________________________________________  Time seen: 5:00AM  I have reviewed the triage vital signs and the nursing notes.   HISTORY  Chief Complaint Drug Overdose and Manic Behavior     HPI [name] is a 44 y.o. female presents with intentional medication overdose (Zyprexa 20mg  tablet approximately 4 tablets). Patient very agitated, restless on arrival. Per patient's husband she has had abnormal speech and very limited sleep for the past 4 days. Patient denies any suicidal ideation she states that she took the medication because she wanted to go to sleep.     Past Medical History  Diagnosis Date  . Bipolar affective   . Psychotic affective disorder   . Restless leg syndrome     There are no active problems to display for this patient.   History reviewed. No pertinent past surgical history.  No current outpatient prescriptions on file.  Allergies Review of patient's allergies indicates no known allergies.  History reviewed. No pertinent family history.  Social History History  Substance Use Topics  . Smoking status: Current Every Day Smoker  . Smokeless tobacco: Not on file  . Alcohol Use: No    Review of Systems  Constitutional: Negative for fever. Eyes: Negative for visual changes. ENT: Negative for sore throat. Cardiovascular: Negative for chest pain. Respiratory: Negative for shortness of breath. Gastrointestinal: Negative for abdominal pain, vomiting and diarrhea. Genitourinary: Negative for dysuria. Musculoskeletal: Negative for back pain. Skin: Negative for rash. Neurological: Negative for headaches, focal weakness or numbness.   10-point ROS otherwise negative.  ____________________________________________   PHYSICAL EXAM:  VITAL SIGNS: ED Triage Vitals  Enc Vitals Group     BP 12/17/14 0415 146/95 mmHg     Pulse Rate 12/17/14 0415 131     Resp 12/17/14  0415 20     Temp 12/17/14 0415 97.6 F (36.4 C)     Temp Source 12/17/14 0415 Oral     SpO2 12/17/14 0415 97 %     Weight 12/17/14 0415 190 lb (86.183 kg)     Height 12/17/14 0415 5' (1.524 m)     Head Cir --      Peak Flow --      Pain Score --      Pain Loc --      Pain Edu? --      Excl. in [place]? --      Constitutional: Alert and oriented. Well appearing and in no distress. Eyes: Conjunctivae are normal. PERRL. Normal extraocular movements. ENT   Head: Normocephalic and atraumatic.   Nose: No congestion/rhinnorhea.   Mouth/Throat: Mucous membranes are moist.   Neck: No stridor.. Cardiovascular: Normal rate, regular rhythm. Normal and symmetric distal pulses are present in all extremities. No murmurs, rubs, or gallops. Respiratory: Normal respiratory effort without tachypnea nor retractions. Breath sounds are clear and equal bilaterally. No wheezes/rales/rhonchi. Gastrointestinal: Soft and nontender. No distention. There is no CVA tenderness. Genitourinary: deferred Musculoskeletal: Nontender with normal range of motion in all extremities. No joint effusions.  No lower extremity tenderness nor edema. Neurologic:  Normal speech and language. No gross focal neurologic deficits are appreciated. Speech is normal.  Skin:  Skin is warm, dry and intact. No rash noted. Psychiatric: Mood and affect are normal. Speech and behavior are normal. Patient exhibits appropriate insight and judgment.  ____________________________________________    LABS (pertinent positives/negatives)  Labs Reviewed  CBC - Abnormal; Notable for the following:    WBC 11.1 (*)  All other components within normal limits  COMPREHENSIVE METABOLIC PANEL - Abnormal; Notable for the following:    Potassium 3.3 (*)    Chloride 112 (*)    Glucose, Bld 126 (*)    Total Bilirubin 0.2 (*)    All other components within normal limits  ACETAMINOPHEN LEVEL - Abnormal; Notable for the following:     Acetaminophen (Tylenol), Serum <10 (*)    All other components within normal limits  URINALYSIS COMPLETEWITH MICROSCOPIC ([facility] ONLY) - Abnormal; Notable for the following:    Color, Urine STRAW (*)    APPearance CLEAR (*)    Hgb urine dipstick 1+ (*)    Squamous Epithelial / LPF 0-5 (*)    All other components within normal limits  SALICYLATE LEVEL  TROPONIN I  CK  URINE DRUG SCREEN, QUALITATIVE ([facility] ONLY)  PREGNANCY, URINE           INITIAL IMPRESSION / ASSESSMENT AND PLAN / ED COURSE  Pertinent labs & imaging results that were available during my care of the patient were reviewed by me and considered in my medical decision making (see chart for details).  History and physical exam consistent with akathisia secondary to medication overdose of Zyprexa. Also concern for mania. ____________________________________________   FINAL CLINICAL IMPRESSION(S) / ED DIAGNOSES  Final diagnoses:  Medication overdose, intentional self-harm, initial encounter  Akathisia  Bipolar affective disorder, currently manic, severe, with psychotic features      Gregor Hams, MD 12/19/14 [phone]

## 2014-12-17 NOTE — ED Notes (Signed)
1on1 sitter at bedside along with two nurses at this time.  Pt unable to tolerate cardiac monitor at this time.  Pt very restless and agitated in bed, unable to stay still.  Pt moaning and muttering.

## 2014-12-17 NOTE — ED Notes (Signed)
Pt awake.  Pt ate small amount of dinner .  Iv in place.  vsigns wnl.  Skin warm and dry.  Sitter at bedside.  pt denies pain.

## 2014-12-17 NOTE — BH Assessment (Signed)
Assessment Note  [name] is an 44 y.o. female. Patient was brought into the ED by her husband for an overdose of zyprexa and flight of ideas per nursing notations.  Patient was sedated because of erractic, agitation, and pulling at IVs per nursing communications.    CSW spoke with Dr. Nicolasa Ducking the patient's primary provider to collect collateral information.  It was reported the patient becomes non-compliant with her medications every couple years and as a result she becomes manic.  It was reported pt will become paranoid and suspicious of others.  Patient is prescribed Zyprexa, Cogentin, and Limictal.    Pending disposition  Axis I: Bipolar, Manic Axis II: Deferred Axis III:  Past Medical History  Diagnosis Date  . Bipolar affective   . Psychotic affective disorder   . Restless leg syndrome    Axis IV: economic problems, occupational problems, other psychosocial or environmental problems and problems related to social environment Axis V: 41-50 serious symptoms  Past Medical History:  Past Medical History  Diagnosis Date  . Bipolar affective   . Psychotic affective disorder   . Restless leg syndrome     History reviewed. No pertinent past surgical history.  Family History: History reviewed. No pertinent family history.  Social History:  reports that she has been smoking.  She does not have any smokeless tobacco history on file. She reports that she does not drink alcohol. Her drug history is not on file.  Additional Social History:  Alcohol / Drug Use Pain Medications: See MARs Prescriptions: See MARs Over the Counter: See MARs History of alcohol / drug use?: No history of alcohol / drug abuse  CIWA: CIWA-Ar BP: 118/71 mmHg Pulse Rate: (!) 111 COWS:    Allergies: No Known Allergies  Home Medications:  (Not in a hospital admission)  OB/GYN Status:  Patient's last menstrual period was 12/17/2014.  General Assessment Data Location of Assessment: [facility] ED TTS  Assessment: In system Is this a Tele or Face-to-Face Assessment?: Face-to-Face Is this an Initial Assessment or a Re-assessment for this encounter?: Initial Assessment Marital status: Married Is patient pregnant?: No Pregnancy Status: No Living Arrangements: Spouse/significant other, Children Can pt return to current living arrangement?: Yes Admission Status: Voluntary Is patient capable of signing voluntary admission?: Yes Referral Source: Self/Family/Friend  Medical Screening Exam [facility] Walk-in ONLY) Medical Exam completed: Yes  Crisis Care Plan Living Arrangements: Spouse/significant other, Children Name of Psychiatrist: Dr. Nicolasa Ducking Name of Therapist: unknown  Education Status Is patient currently in school?: No  Risk to self with the past 6 months Suicidal Ideation: No (unable to assess/per admission notes she denies SI) Has patient been a risk to self within the past 6 months prior to admission? : No Is patient at risk for suicide?: No What has been your use of drugs/alcohol within the last 12 months?:  (unable to assess) Intentional Self Injurious Behavior: None (per Dr. Nicolasa Ducking no history) Family Suicide History: Unknown, Unable to assess Recent stressful life event(s): Other (Comment) (Per Dr. Nicolasa Ducking pt is non-compliant with medictions.  ) Persecutory voices/beliefs?: No Depression:  (unable to assess, per Dr. Gates Rigg pt is manic) Substance abuse history and/or treatment for substance abuse?: No  Risk to Others within the past 6 months Homicidal Ideation:  (unable to assess) Does patient have any lifetime risk of violence toward others beyond the six months prior to admission? : Unknown History of harm to others?:  (unable to assess, Per Dr. Nicolasa Ducking none ) Assessment of Violence: On admission  Violent Behavior Description: erractic, pulling out IV,  Criminal Charges Pending?: No  Psychosis Hallucinations:  (unable to assess) Delusions:  (unable to assess)  Mental Status  Report Appearance/Hygiene:  (unable to assess) Eye Contact: Unable to Assess Motor Activity: Agitation, Unsteady, Hyperactivity, Restlessness (akathisic) Speech: Unable to assess Level of Consciousness: Sedated Mood:  (unable to assess) Affect: Unable to Assess Anxiety Level:  (unable to assess) Thought Processes: Unable to Assess Judgement: Unable to Assess Orientation: Unable to assess Obsessive Compulsive Thoughts/Behaviors: Unable to Assess  Cognitive Functioning Concentration: Unable to Assess Memory: Unable to Assess Insight: Unable to Assess Impulse Control: Unable to Assess Sleep: Unable to Assess Vegetative Symptoms: Unable to Assess  ADLScreening [facility] Assessment Services) Patient's cognitive ability adequate to safely complete daily activities?:  (unable to assess) Patient able to express need for assistance with ADLs?:  (unable to assess) Independently performs ADLs?:  (unable to assess)     Prior Outpatient Therapy Prior Outpatient Therapy: Yes Prior Therapy Dates: currently Prior Therapy Facilty/Provider(s): Dr. Nicolasa Ducking Reason for Treatment: Bipolar  ADL Screening (condition at time of admission) Patient's cognitive ability adequate to safely complete daily activities?:  (unable to assess) Patient able to express need for assistance with ADLs?:  (unable to assess) Independently performs ADLs?:  (unable to assess)       Abuse/Neglect Assessment (Assessment to be complete while patient is alone) Physical Abuse: Denies Verbal Abuse: Denies Sexual Abuse: Denies Exploitation of patient/patient's resources: Denies Self-Neglect: Denies Values / Beliefs Cultural Requests During Hospitalization: None Spiritual Requests During Hospitalization: None Consults Spiritual Care Consult Needed: No Social Work Consult Needed: No Regulatory affairs officer (For Healthcare) Does patient have an advance directive?: No Would patient like information on creating an advanced  directive?: Yes Higher education careers adviser given    Additional Information 1:1 In Past 12 Months?: No CIRT Risk: No Elopement Risk: No Does patient have medical clearance?: No     Disposition:  Disposition Initial Assessment Completed for this Encounter: Yes Disposition of Patient: Other dispositions Other disposition(s): Other (Comment) (pending )  On Site Evaluation by:   Reviewed with Physician:    Chesley Noon A 12/17/2014 1:53 PM

## 2014-12-18 ENCOUNTER — Inpatient Hospital Stay
Admission: EM | Admit: 2014-12-18 | Discharge: 2014-12-23 | DRG: 885 | Disposition: A | Discharge status: Home / Self Care | Payer: No Typology Code available for payment source | Source: Intra-hospital | Attending: Psychiatry | Admitting: Psychiatry

## 2014-12-18 ENCOUNTER — Encounter: Payer: Self-pay | Admitting: Psychiatry

## 2014-12-18 DIAGNOSIS — Z79899 Other long term (current) drug therapy: Secondary | ICD-10-CM | POA: Diagnosis not present

## 2014-12-18 DIAGNOSIS — Z9114 Patient's other noncompliance with medication regimen: Secondary | ICD-10-CM | POA: Diagnosis present

## 2014-12-18 DIAGNOSIS — F312 Bipolar disorder, current episode manic severe with psychotic features: Secondary | ICD-10-CM | POA: Diagnosis not present

## 2014-12-18 DIAGNOSIS — F17210 Nicotine dependence, cigarettes, uncomplicated: Secondary | ICD-10-CM | POA: Diagnosis present

## 2014-12-18 DIAGNOSIS — G2581 Restless legs syndrome: Secondary | ICD-10-CM | POA: Diagnosis present

## 2014-12-18 DIAGNOSIS — G4700 Insomnia, unspecified: Secondary | ICD-10-CM | POA: Diagnosis present

## 2014-12-18 DIAGNOSIS — G2571 Drug induced akathisia: Secondary | ICD-10-CM | POA: Diagnosis present

## 2014-12-18 LAB — LIPID PANEL
Cholesterol: 175 mg/dL (ref 0–200)
HDL: 45 mg/dL (ref >40)
LDL Cholesterol: 99 mg/dL (ref 0–99)
TRIGLYCERIDES: 153 mg/dL — AB (ref <150)
Total CHOL/HDL Ratio: 3.9 RATIO
VLDL: 31 mg/dL (ref 0–40)

## 2014-12-18 LAB — TSH: TSH: 1.985 u[IU]/mL (ref 0.350–4.500)

## 2014-12-18 LAB — HEMOGLOBIN A1C: Hgb A1c MFr Bld: 5.4 % (ref 4.0–6.0)

## 2014-12-18 MED ORDER — ALUM & MAG HYDROXIDE-SIMETH 200-200-20 MG/5ML PO SUSP: 30.0000 mL | ORAL | Status: DC | PRN

## 2014-12-18 MED ORDER — OLANZAPINE 10 MG PO TABS
20.0000 mg | ORAL_TABLET | Freq: Every day | ORAL | Status: DC
  Administered 2014-12-18 – 2014-12-22 (×5): 20 mg via ORAL
  Filled 2014-12-18 (×5): qty 2

## 2014-12-18 MED ORDER — MAGNESIUM HYDROXIDE 400 MG/5ML PO SUSP
30.0000 mL | Freq: Every day | ORAL | Status: DC | PRN
Start: 2014-12-18 — End: 2014-12-23

## 2014-12-18 MED ORDER — CLONAZEPAM 1 MG PO TABS
1.0000 mg | ORAL_TABLET | Freq: Every day | ORAL | Status: DC
Start: 2014-12-19 — End: 2014-12-20
  Administered 2014-12-19: 1 mg via ORAL
  Filled 2014-12-18: qty 1

## 2014-12-18 MED ORDER — CLONAZEPAM 0.5 MG PO TABS
0.5000 mg | ORAL_TABLET | Freq: Two times a day (BID) | ORAL | Status: DC
  Administered 2014-12-18: 0.5 mg via ORAL
  Filled 2014-12-18: qty 1

## 2014-12-18 MED ORDER — CLONAZEPAM 0.5 MG PO TABS
0.5000 mg | ORAL_TABLET | Freq: Every day | ORAL | Status: DC
  Administered 2014-12-19 – 2014-12-20 (×2): 0.5 mg via ORAL
  Filled 2014-12-18 (×3): qty 1

## 2014-12-18 MED ORDER — LAMOTRIGINE 25 MG PO TABS
25.0000 mg | ORAL_TABLET | Freq: Once | ORAL | Status: AC
  Administered 2014-12-18: 25 mg via ORAL

## 2014-12-18 MED ORDER — DIPHENHYDRAMINE HCL 50 MG PO CAPS
ORAL_CAPSULE | ORAL | Status: AC
  Administered 2014-12-18: 50 mg via ORAL
  Filled 2014-12-18: qty 1

## 2014-12-18 MED ORDER — ACETAMINOPHEN 325 MG PO TABS: 650.0000 mg | ORAL_TABLET | Freq: Four times a day (QID) | ORAL | Status: DC | PRN

## 2014-12-18 MED ORDER — DIPHENHYDRAMINE HCL 50 MG PO CAPS
50.0000 mg | ORAL_CAPSULE | Freq: Once | ORAL | Status: AC
  Administered 2014-12-18: 50 mg via ORAL

## 2014-12-18 MED ORDER — LAMOTRIGINE 25 MG PO TABS
25.0000 mg | ORAL_TABLET | Freq: Every day | ORAL | Status: DC
  Administered 2014-12-19 – 2014-12-23 (×5): 25 mg via ORAL
  Filled 2014-12-18 (×7): qty 1

## 2014-12-18 NOTE — Progress Notes (Signed)
Patient new admit to unit and is alert and oriented x 4. Patient is slightly anxious and hyperverbal. Needs support and encouragement with redirection back to admission interview and assessment. Denies SI/HI/AVH at this time. Patient states she " may have had visual hallucinations last evening or I may have been dreaming, I'm not sure". Skin check with no wounds, bruises or contraband found. Belonging check with no contraband found. Orient to room and current plan of care, patient overwhelmed and not oriented to unit at this time. Needs assistance to use phone to notify family of code number and status. Resting in room at this time, safety maintained.

## 2014-12-18 NOTE — ED Provider Notes (Signed)
-----------------------------------------   4:47 AM on 12/18/2014 -----------------------------------------   Blood pressure 106/76, pulse 98, temperature 97.9 F (36.6 C), temperature source Oral, resp. rate 20, height 5' (1.524 m), weight 190 lb (86.183 kg), last menstrual period 12/17/2014, SpO2 98 %.  The patient had no acute events since last update.  Calm and cooperative at this time.    Patient currently has a Geophysical data processor which was ordered by the admitting psychiatrist yesterday. The patient is easily redirectable. Patient can remove from a medical room to the behavioral area as she is medically cleared and awaiting psychiatry admission. In the morning attending psychiatrist will be called by the nurse to discuss discontinuation of one-on-one sitter.    Lisa Roca, MD 12/18/14 [phone]

## 2014-12-18 NOTE — Plan of Care (Signed)
Problem: Consults Goal: [facility] General Treatment Patient Education Outcome: Progressing Patient cooperative with admission assessment and interview. Denies SI/HI/AVH at this time.

## 2014-12-18 NOTE — ED Notes (Signed)
Called Behavioral med to give report.  RN currently pulling meds for other patients and will return my call to get report.

## 2014-12-18 NOTE — BH Assessment (Signed)
[employer] Enrollment completed and submitted. STR# [id number].

## 2014-12-18 NOTE — ED Notes (Signed)
BEHAVIORAL HEALTH ROUNDING Patient sleeping: No. Patient alert and oriented: yes Behavior appropriate: Yes.   Nutrition and fluids offered: Yes  Toileting and hygiene offered: Yes  Sitter present: 1:1 sittter with q15 min observations  Law enforcement present: Yes [employer]

## 2014-12-18 NOTE — Progress Notes (Signed)
Recreation Therapy Notes  Date: 07.14.16 Time: 3:00 pm Location: Craft Room  Group Topic: Leisure Education  Goal Area(s) Addresses:  Patient will identify activities for each letter of the alphabet. Patient will verbalize ability to integrate positive leisure into life post d/c. Patient will verbalize ability to use leisure as a coping mechanism.  Behavioral Response: Did not attend  Intervention: Leisure Alphabet  Activity: Patients were given a Leisure Air traffic controller and instructed to list a positive leisure activity for each letter of the alphabet.   Education: LRT educated patients on what is needed to participate in leisure activities.  Education Outcome: Patient did not attend group.  Clinical Observations/Feedback: Patient did not attend group.  Leonette Monarch, LRT/CTRS 12/18/2014 4:15 PM

## 2014-12-18 NOTE — ED Notes (Signed)
BEHAVIORAL HEALTH ROUNDING Patient sleeping: YES Patient alert and oriented: YES Behavior appropriate: YES Describe behavior: No inappropriate or unacceptable behaviors noted at this time.  Nutrition and fluids offered: YES Toileting and hygiene offered: YES Sitter present: Yes 1:1 sitter at bedside. Law enforcement present: Programmer, applications agency: [place] (ODS)

## 2014-12-18 NOTE — ED Notes (Signed)
Breakfast tray delivered

## 2014-12-18 NOTE — ED Notes (Signed)
BEHAVIORAL HEALTH ROUNDING Patient sleeping: YES Patient alert and oriented: YES Behavior appropriate: YES Describe behavior: No inappropriate or unacceptable behaviors noted at this time.  Nutrition and fluids offered: YES Toileting and hygiene offered: YES Sitter present: Yes, 1:1 sitter present Event organiser present: Programmer, applications agency: [place] (ODS)

## 2014-12-18 NOTE — BHH Group Notes (Signed)
[name] LCSW Group Therapy  12/18/2014 1:49 PM  Type of Therapy:  Group Therapy  Participation Level:  Did Not Attend  Participation Quality:    Affect:    Cognitive:    Insight:    Engagement in Therapy:    Modes of Intervention:    Summary of Progress/Problems:  [name] 12/18/2014, 1:49 PM

## 2014-12-18 NOTE — ED Notes (Signed)
Patient resting with eyes closed and lights turned off, 1:1 sitter at bedside. NAD noted at this time. Will continue to monitor.

## 2014-12-18 NOTE — BHH Suicide Risk Assessment (Signed)
[facility] Admission Suicide Risk Assessment   Nursing information obtained from:    Demographic factors:    Current Mental Status:    Loss Factors:    Historical Factors:    Risk Reduction Factors:    Total Time spent with patient: 1 hour Principal Problem: Severe manic bipolar 1 disorder with psychotic behavior Diagnosis:   Patient Active Problem List   Diagnosis Date Noted  . Severe manic bipolar 1 disorder with psychotic behavior [F31.2] 12/18/2014  . Restless legs [G25.81] 12/17/2014  . Akathisia [R45.0] 12/17/2014     Continued Clinical Symptoms:    The "Alcohol Use Disorders Identification Test", Guidelines for Use in Primary Care, Second Edition.  World Pharmacologist [facility]). Score between 0-7:  no or low risk or alcohol related problems. Score between 8-15:  moderate risk of alcohol related problems. Score between 16-19:  high risk of alcohol related problems. Score 20 or above:  warrants further diagnostic evaluation for alcohol dependence and treatment.   CLINICAL FACTORS:   Severe Anxiety and/or Agitation Alcohol/Substance Abuse/Dependencies Currently Psychotic Previous Psychiatric Diagnoses and Treatments    Psychiatric Specialty Exam: Physical Exam  ROS    COGNITIVE FEATURES THAT CONTRIBUTE TO RISK:  None    SUICIDE RISK:   Moderate:  Frequent suicidal ideation with limited intensity, and duration, some specificity in terms of plans, no associated intent, good self-control, limited dysphoria/symptomatology, some risk factors present, and identifiable protective factors, including available and accessible social support.  PLAN OF CARE: admit to psych  Medical Decision Making:  Established Problem, Worsening (2)  I certify that inpatient services furnished can reasonably be expected to improve the patient's condition.   Hildred Priest 12/18/2014, 12:16 PM

## 2014-12-18 NOTE — ED Notes (Signed)
BEHAVIORAL HEALTH ROUNDING Patient sleeping: No. Patient alert and oriented: yes Behavior appropriate: Yes.   Nutrition and fluids offered: Yes  Toileting and hygiene offered: Yes  Sitter present: 1:1 sitter with q15 min checks Law enforcement present: Yes [employer]  ENVIRONMENTAL ASSESSMENT Potentially harmful objects out of patient reach: Yes.   Personal belongings secured: Yes.   Patient dressed in hospital provided attire only: Yes.   Plastic bags out of patient reach: Yes.   Patient care equipment (cords, cables, call bells, lines, and drains) shortened, removed, or accounted for: Yes.   Equipment and supplies removed from bottom of stretcher: Yes.   Potentially toxic materials out of patient reach: Yes.   Sharps container removed or out of patient reach: Yes.    Patient assigned to appropriate care area. Patient oriented to unit/care area: Informed that, for their safety, care areas are designed for safety and monitored by security cameras at all times; and visiting hours explained to patient. Patient verbalizes understanding, and verbal contract for safety obtained.

## 2014-12-18 NOTE — ED Notes (Signed)

## 2014-12-18 NOTE — ED Provider Notes (Signed)
-----------------------------------------   7:00 AM on 12/18/2014 -----------------------------------------   Blood pressure 106/76, pulse 98, temperature 97.9 F (36.6 C), temperature source Oral, resp. rate 20, height 5' (1.524 m), weight 190 lb (86.183 kg), last menstrual period 12/17/2014, SpO2 98 %.  The patient had no acute events since last update.  She has been admitted to behavioral medicine and is awaiting transfer to that unit.  Hinda Kehr, MD 12/18/14 [phone]

## 2014-12-18 NOTE — ED Notes (Signed)
Patient resting with eyes closed, 1:1 sitter at bedside.

## 2014-12-18 NOTE — Tx Team (Signed)
Initial Interdisciplinary Treatment Plan   PATIENT STRESSORS: Financial difficulties Marital or family conflict Medication change or noncompliance   PATIENT STRENGTHS: Capable of independent living Supportive family/friends Work skills   PROBLEM LIST: Problem List/Patient Goals Date to be addressed Date deferred Reason deferred Estimated date of resolution                                                         DISCHARGE CRITERIA:  Adequate post-discharge living arrangements Improved stabilization in mood, thinking, and/or behavior Motivation to continue treatment in a less acute level of care  PRELIMINARY DISCHARGE PLAN: Attend aftercare/continuing care group Outpatient therapy Return to previous living arrangement  PATIENT/FAMIILY INVOLVEMENT: This treatment plan has been presented to and reviewed with the patient, [name], and/or family member, .  The patient and family have been given the opportunity to ask questions and make suggestions.  Raul Del 12/18/2014, 4:36 PM

## 2014-12-18 NOTE — ED Notes (Signed)
BEHAVIORAL HEALTH ROUNDING Patient sleeping: YES Patient alert and oriented: YES Behavior appropriate: YES Describe behavior: No inappropriate or unacceptable behaviors noted at this time.  Nutrition and fluids offered: YES Toileting and hygiene offered: YES Sitter present: 1:1 sitter at bedside.  Law enforcement present: Programmer, applications agency: [place] (ODS)

## 2014-12-18 NOTE — ED Notes (Signed)
Patient requesting something "to help me sleep." Dr. Reita Cliche made aware, see [facility] for verbal order.

## 2014-12-18 NOTE — BHH Group Notes (Signed)
[place] Group Notes:  (Nursing/MHT/Case Management/Adjunct)  Date:  12/18/2014  Time:  2:55 PM  Type of Therapy:  Psychoeducational Skills  Participation Level:  Minimal  Participation Quality:  Appropriate and Sharing  Affect:  Flat  Cognitive:  Appropriate  Insight:  Limited  Engagement in Group:  Lacking  Modes of Intervention:  Discussion, Education and Support  Summary of Progress/Problems:  Adela Lank Nardos Putnam 12/18/2014, 2:55 PM

## 2014-12-18 NOTE — BHH Group Notes (Signed)
[place] Group Notes:  (Nursing/MHT/Case Management/Adjunct)  Date:  12/18/2014  Time:  11:16 PM  Type of Therapy:  Group Therapy  Participation Level:  Did Not Attend   [name] [name] [name] 12/18/2014, 11:16 PM

## 2014-12-18 NOTE — H&P (Signed)
Psychiatric Admission Assessment Adult  Patient Identification: [name] MRN:  [id number] Date of Evaluation:  12/18/2014 Chief Complaint:  Bipolar manic Principal Diagnosis: Severe manic bipolar 1 disorder with psychotic behavior Diagnosis:   Patient Active Problem List   Diagnosis Date Noted  . Severe manic bipolar 1 disorder with psychotic behavior [F31.2] 12/18/2014  . Restless legs [G25.81] 12/17/2014  . Akathisia [R45.0] 12/17/2014   History of Present Illness: [name] is an 44 y.o. female. Patient was brought into the ED by her husband for an overdose of zyprexa and flight of ideas per nursing notations (7/13). Patient was sedated because of erractic, agitation, and pulling at IVs per nursing communications.   CSW spoke with Dr. Nicolasa Ducking the patient's primary provider. It was reported the patient becomes non-compliant with her medications every couple years and as a result she becomes manic. It was reported pt will become paranoid and suspicious of others. Patient is prescribed  Cogentin 0.5 mg po bid, Zyprexa 20 mg daily at bedtime, lamotrigine 200 mg daily  Patient presented to the emergency room last night after taking an excessive number of her Zyprexa probably just about 3 or 4 of them. She got even more agitated at that point and according to the patient she wanted to come to the emergency room. She tells me that she's been off of her medicine probably for more than a week. She doesn't have a really good excuse for that. She says that she's been drinking but her history on this point is hard to credit and not very detailed. She tells me that her mood is been bad and she is feeling bad. She is otherwise not a very good historian.  Substance abuse history: Patient says that she's been drinking but her history is unreliable from what I can tell. She denies any other drug use. There is not a clear past history of substance abuse.   HPI Elements: Quality: Agitation  and thought disorganization bizarre and unpredictable behavior. Severity: Severe  Timing: Seems to be getting worse over probably the last week or so. Came into the hospital last night. Duration: Chronic illness. Context: Recent medicine noncompliance.  Past psychiatric history: History of bipolar disorder versus psychotic depression. Has had periodic decompensations often related to medicine noncompliance. It looks like her last hospitalization was about a year and a half ago. No known history of suicide attempts. Has been on multiple anti-psychotics in the past with best response to Zyprexa and lamotrigine which is her current combination.  She has been tried on multiple medications; lithium, Depakote, Abilify, Tegretol, Requip, Trilafon she did well on, Lamictal. She cannot tolerate Geodon or Seroquel. There were no suicide attempts.   Past Medical History:  Patient has a history of restless leg syndrome for which she has been prescribed Requip in the past. There was some question in the past about whether the Requip could have triggered some of her psychotic symptoms. It's also been not entirely clear whether the restless legs could be actually akathisia.  Past Medical History  Diagnosis Date  . Bipolar affective   . Psychotic affective disorder   . Restless leg syndrome    History reviewed. No pertinent past surgical history. Family History: History reviewed. No pertinent family history.   Social History: Patient is married lives with her husband. Husband evidently owns a business of some sort. Dr. Nicolasa Ducking tells me that she has seen the patient be stable and her family relationships before. History  Alcohol Use No  History  Drug Use Not on file    History   Social History  . Marital Status: Unknown    Spouse Name: N/A  . Number of Children: N/A  . Years of Education: N/A   Social History Main Topics  . Smoking status: Current Every Day Smoker  . Smokeless tobacco: Not  on file  . Alcohol Use: No  . Drug Use: Not on file  . Sexual Activity: Not on file   Other Topics Concern  . None   Social History Narrative    Musculoskeletal: Strength & Muscle Tone: within normal limits Gait & Station: normal Patient leans: N/A  Psychiatric Specialty Exam: Physical Exam  ROS  Last menstrual period 12/17/2014.There is no height or weight on file to calculate BMI.  General Appearance: Well Groomed  Eye Contact::  Good  Speech:  Pressured  Volume:  Normal  Mood:  Euphoric  Affect:  Congruent  Thought Process:  Tangential  Orientation:  Full (Time, Place, and Person)  Thought Content:  Hallucinations: None  Suicidal Thoughts:  No  Homicidal Thoughts:  No  Memory:  Immediate;   Good Recent;   Good Remote;   Good  Judgement:  Impaired  Insight:  Shallow  Psychomotor Activity:  Increased  Concentration:  Poor  Recall:  NA  Fund of Knowledge:Good  Language: Good  Akathisia:  Yes  Handed:    AIMS (if indicated):     Assets:  Museum/gallery curator Physical Health Social Support Transportation Vocational/Educational  ADL's:  Intact  Cognition: WNL  Sleep:      Physical Exam completed in the ER Constitutional: She appears well-developed and well-nourished. She appears lethargic.  HENT:  Head: Normocephalic and atraumatic.  Eyes: Conjunctivae are normal. Pupils are equal, round, and reactive to light.  Neck: Normal range of motion.  Cardiovascular: Normal heart sounds.  Respiratory: Effort normal.  GI: Soft.  Musculoskeletal: Normal range of motion.  Neurological: She appears lethargic.  Patient is restless and somewhat agitated. Can't sit still. Shaking her legs constantly. Probably having akathisia.  Skin: Skin is warm and dry.  Psychiatric: Her mood appears anxious. Her affect is blunt. Her speech is rapid and/or pressured, tangential and slurred. She is agitated. Thought content is delusional.  Cognition and memory are impaired. She expresses impulsivity. She exhibits abnormal recent memory. She is inattentive.   Allergies:  No Known Allergies   Lab Results:  Results for orders placed or performed during the hospital encounter of 12/17/14 (from the past 48 hour(s))  Urine Drug Screen, Qualitative ([facility] only)     Status: None   Collection Time: 12/17/14  4:42 AM  Result Value Ref Range   Tricyclic, Ur Screen NONE DETECTED NONE DETECTED   Amphetamines, Ur Screen NONE DETECTED NONE DETECTED   MDMA (Ecstasy)Ur Screen NONE DETECTED NONE DETECTED   Cocaine Metabolite,Ur [place] NONE DETECTED NONE DETECTED   Opiate, Ur Screen NONE DETECTED NONE DETECTED   Phencyclidine (PCP) Ur S NONE DETECTED NONE DETECTED   Cannabinoid 50 Ng, Ur [place] NONE DETECTED NONE DETECTED   Barbiturates, Ur Screen NONE DETECTED NONE DETECTED   Benzodiazepine, Ur Scrn NONE DETECTED NONE DETECTED   Methadone Scn, Ur NONE DETECTED NONE DETECTED    Comment: (NOTE) 998  Tricyclics, urine               Cutoff 1000 ng/mL 200  Amphetamines, urine             Cutoff 1000 ng/mL 300  MDMA (Ecstasy), urine           Cutoff 500 ng/mL 400  Cocaine Metabolite, urine       Cutoff 300 ng/mL 500  Opiate, urine                   Cutoff 300 ng/mL 600  Phencyclidine (PCP), urine      Cutoff 25 ng/mL 700  Cannabinoid, urine              Cutoff 50 ng/mL 800  Barbiturates, urine             Cutoff 200 ng/mL 900  Benzodiazepine, urine           Cutoff 200 ng/mL 1000 Methadone, urine                Cutoff 300 ng/mL 1100 1200 The urine drug screen provides only a preliminary, unconfirmed 1300 analytical test result and should not be used for non-medical 1400 purposes. Clinical consideration and professional judgment should 1500 be applied to any positive drug screen result due to possible 1600 interfering substances. A more specific alternate chemical method 1700 must be used in order to obtain a confirmed analytical result.  1800 Gas  chromato graphy / mass spectrometry (GC/MS) is the preferred 1900 confirmatory method.   Urinalysis complete, with microscopic ([facility] only)     Status: Abnormal   Collection Time: 12/17/14  4:42 AM  Result Value Ref Range   Color, Urine STRAW (A) YELLOW   APPearance CLEAR (A) CLEAR   Glucose, UA NEGATIVE NEGATIVE mg/dL   Bilirubin Urine NEGATIVE NEGATIVE   Ketones, ur NEGATIVE NEGATIVE mg/dL   Specific Gravity, Urine 1.008 1.005 - 1.030   Hgb urine dipstick 1+ (A) NEGATIVE   pH 5.0 5.0 - 8.0   Protein, ur NEGATIVE NEGATIVE mg/dL   Nitrite NEGATIVE NEGATIVE   Leukocytes, UA NEGATIVE NEGATIVE   RBC / HPF 0-5 0 - 5 RBC/hpf   WBC, UA 0-5 0 - 5 WBC/hpf   Bacteria, UA NONE SEEN NONE SEEN   Squamous Epithelial / LPF 0-5 (A) NONE SEEN   Mucous PRESENT   Pregnancy, urine     Status: None   Collection Time: 12/17/14  4:42 AM  Result Value Ref Range   Preg Test, Ur NEGATIVE NEGATIVE  CBC     Status: Abnormal   Collection Time: 12/17/14  4:47 AM  Result Value Ref Range   WBC 11.1 (H) 3.6 - 11.0 K/uL   RBC 4.54 3.80 - 5.20 MIL/uL   Hemoglobin 13.6 12.0 - 16.0 g/dL   HCT 41.3 35.0 - 47.0 %   MCV 91.1 80.0 - 100.0 fL   MCH 30.1 26.0 - 34.0 pg   MCHC 33.0 32.0 - 36.0 g/dL   RDW 13.6 11.5 - 14.5 %   Platelets 318 150 - 440 K/uL  Comprehensive metabolic panel     Status: Abnormal   Collection Time: 12/17/14  4:47 AM  Result Value Ref Range   Sodium 141 135 - 145 mmol/L   Potassium 3.3 (L) 3.5 - 5.1 mmol/L   Chloride 112 (H) 101 - 111 mmol/L   CO2 24 22 - 32 mmol/L   Glucose, Bld 126 (H) 65 - 99 mg/dL   BUN 12 6 - 20 mg/dL   Creatinine, Ser 0.78 0.44 - 1.00 mg/dL   Calcium 8.9 8.9 - 10.3 mg/dL   Total Protein 7.4 6.5 - 8.1 g/dL   Albumin 4.1 3.5 - 5.0 g/dL  AST 25 15 - 41 U/L   ALT 24 14 - 54 U/L   Alkaline Phosphatase 70 38 - 126 U/L   Total Bilirubin 0.2 (L) 0.3 - 1.2 mg/dL   GFR calc non Af Amer >60 >60 mL/min   GFR calc Af Amer >60 >60 mL/min    Comment: (NOTE) The eGFR  has been calculated using the CKD EPI equation. This calculation has not been validated in all clinical situations. eGFR's persistently <60 mL/min signify possible Chronic Kidney Disease.    Anion gap 5 5 - 15  Acetaminophen level     Status: Abnormal   Collection Time: 12/17/14  4:47 AM  Result Value Ref Range   Acetaminophen (Tylenol), Serum <10 (L) 10 - 30 ug/mL    Comment:        THERAPEUTIC CONCENTRATIONS VARY SIGNIFICANTLY. A RANGE OF 10-30 ug/mL MAY BE AN EFFECTIVE CONCENTRATION FOR MANY PATIENTS. HOWEVER, SOME ARE BEST TREATED AT CONCENTRATIONS OUTSIDE THIS RANGE. ACETAMINOPHEN CONCENTRATIONS >150 ug/mL AT 4 HOURS AFTER INGESTION AND >50 ug/mL AT 12 HOURS AFTER INGESTION ARE OFTEN ASSOCIATED WITH TOXIC REACTIONS.   Salicylate level     Status: None   Collection Time: 12/17/14  4:47 AM  Result Value Ref Range   Salicylate Lvl <0.6 2.8 - 30.0 mg/dL  Troponin I     Status: None   Collection Time: 12/17/14  4:47 AM  Result Value Ref Range   Troponin I <0.03 <0.031 ng/mL    Comment:        NO INDICATION OF MYOCARDIAL INJURY.   CK     Status: None   Collection Time: 12/17/14  4:47 AM  Result Value Ref Range   Total CK 53 38 - 234 U/L   Current Medications: Current Facility-Administered Medications  Medication Dose Route Frequency Provider Last Rate Last Dose  . acetaminophen (TYLENOL) tablet 650 mg  650 mg Oral Q6H PRN Gonzella Lex, MD      . alum & mag hydroxide-simeth (MAALOX/MYLANTA) 200-200-20 MG/5ML suspension 30 mL  30 mL Oral Q4H PRN Gonzella Lex, MD      . clonazePAM [place]bye [place]) tablet 0.5 mg  0.5 mg Oral BID Gonzella Lex, MD   0.5 mg at 12/18/14 1231  . lamoTRIgine (LAMICTAL) tablet 200 mg  200 mg Oral Daily Gonzella Lex, MD   200 mg at 12/18/14 1232  . magnesium hydroxide (MILK OF MAGNESIA) suspension 30 mL  30 mL Oral Daily PRN Gonzella Lex, MD      . OLANZapine (ZYPREXA) tablet 20 mg  20 mg Oral QHS Gonzella Lex, MD       PTA  Medications: Prescriptions prior to admission  Medication Sig Dispense Refill Last Dose  . benztropine (COGENTIN) 0.5 MG tablet Take 0.5 mg by mouth 2 (two) times daily.   unknown  . lamoTRIgine (LAMICTAL) 100 MG tablet Take 100-200 mg by mouth 2 (two) times daily. 1 tablet every morning and 2 tablets at bedtime   unknown  . OLANZapine (ZYPREXA) 20 MG tablet Take 20 mg by mouth at bedtime.   unknown      Results for orders placed or performed during the hospital encounter of 12/17/14 (from the past 72 hour(s))  Urine Drug Screen, Qualitative ([facility] only)     Status: None   Collection Time: 12/17/14  4:42 AM  Result Value Ref Range   Tricyclic, Ur Screen NONE DETECTED NONE DETECTED   Amphetamines, Ur Screen NONE DETECTED NONE DETECTED  MDMA (Ecstasy)Ur Screen NONE DETECTED NONE DETECTED   Cocaine Metabolite,Ur [place] NONE DETECTED NONE DETECTED   Opiate, Ur Screen NONE DETECTED NONE DETECTED   Phencyclidine (PCP) Ur S NONE DETECTED NONE DETECTED   Cannabinoid 50 Ng, Ur [place] NONE DETECTED NONE DETECTED   Barbiturates, Ur Screen NONE DETECTED NONE DETECTED   Benzodiazepine, Ur Scrn NONE DETECTED NONE DETECTED   Methadone Scn, Ur NONE DETECTED NONE DETECTED    Comment: (NOTE) 338  Tricyclics, urine               Cutoff 1000 ng/mL 200  Amphetamines, urine             Cutoff 1000 ng/mL 300  MDMA (Ecstasy), urine           Cutoff 500 ng/mL 400  Cocaine Metabolite, urine       Cutoff 300 ng/mL 500  Opiate, urine                   Cutoff 300 ng/mL 600  Phencyclidine (PCP), urine      Cutoff 25 ng/mL 700  Cannabinoid, urine              Cutoff 50 ng/mL 800  Barbiturates, urine             Cutoff 200 ng/mL 900  Benzodiazepine, urine           Cutoff 200 ng/mL 1000 Methadone, urine                Cutoff 300 ng/mL 1100 1200 The urine drug screen provides only a preliminary, unconfirmed 1300 analytical test result and should not be used for non-medical 1400 purposes. Clinical consideration and  professional judgment should 1500 be applied to any positive drug screen result due to possible 1600 interfering substances. A more specific alternate chemical method 1700 must be used in order to obtain a confirmed analytical result.  1800 Gas chromato graphy / mass spectrometry (GC/MS) is the preferred 1900 confirmatory method.   Urinalysis complete, with microscopic ([facility] only)     Status: Abnormal   Collection Time: 12/17/14  4:42 AM  Result Value Ref Range   Color, Urine STRAW (A) YELLOW   APPearance CLEAR (A) CLEAR   Glucose, UA NEGATIVE NEGATIVE mg/dL   Bilirubin Urine NEGATIVE NEGATIVE   Ketones, ur NEGATIVE NEGATIVE mg/dL   Specific Gravity, Urine 1.008 1.005 - 1.030   Hgb urine dipstick 1+ (A) NEGATIVE   pH 5.0 5.0 - 8.0   Protein, ur NEGATIVE NEGATIVE mg/dL   Nitrite NEGATIVE NEGATIVE   Leukocytes, UA NEGATIVE NEGATIVE   RBC / HPF 0-5 0 - 5 RBC/hpf   WBC, UA 0-5 0 - 5 WBC/hpf   Bacteria, UA NONE SEEN NONE SEEN   Squamous Epithelial / LPF 0-5 (A) NONE SEEN   Mucous PRESENT   Pregnancy, urine     Status: None   Collection Time: 12/17/14  4:42 AM  Result Value Ref Range   Preg Test, Ur NEGATIVE NEGATIVE  CBC     Status: Abnormal   Collection Time: 12/17/14  4:47 AM  Result Value Ref Range   WBC 11.1 (H) 3.6 - 11.0 K/uL   RBC 4.54 3.80 - 5.20 MIL/uL   Hemoglobin 13.6 12.0 - 16.0 g/dL   HCT 41.3 35.0 - 47.0 %   MCV 91.1 80.0 - 100.0 fL   MCH 30.1 26.0 - 34.0 pg   MCHC 33.0 32.0 - 36.0 g/dL   RDW 13.6 11.5 -  14.5 %   Platelets 318 150 - 440 K/uL  Comprehensive metabolic panel     Status: Abnormal   Collection Time: 12/17/14  4:47 AM  Result Value Ref Range   Sodium 141 135 - 145 mmol/L   Potassium 3.3 (L) 3.5 - 5.1 mmol/L   Chloride 112 (H) 101 - 111 mmol/L   CO2 24 22 - 32 mmol/L   Glucose, Bld 126 (H) 65 - 99 mg/dL   BUN 12 6 - 20 mg/dL   Creatinine, Ser 0.78 0.44 - 1.00 mg/dL   Calcium 8.9 8.9 - 10.3 mg/dL   Total Protein 7.4 6.5 - 8.1 g/dL   Albumin 4.1  3.5 - 5.0 g/dL   AST 25 15 - 41 U/L   ALT 24 14 - 54 U/L   Alkaline Phosphatase 70 38 - 126 U/L   Total Bilirubin 0.2 (L) 0.3 - 1.2 mg/dL   GFR calc non Af Amer >60 >60 mL/min   GFR calc Af Amer >60 >60 mL/min    Comment: (NOTE) The eGFR has been calculated using the CKD EPI equation. This calculation has not been validated in all clinical situations. eGFR's persistently <60 mL/min signify possible Chronic Kidney Disease.    Anion gap 5 5 - 15  Acetaminophen level     Status: Abnormal   Collection Time: 12/17/14  4:47 AM  Result Value Ref Range   Acetaminophen (Tylenol), Serum <10 (L) 10 - 30 ug/mL    Comment:        THERAPEUTIC CONCENTRATIONS VARY SIGNIFICANTLY. A RANGE OF 10-30 ug/mL MAY BE AN EFFECTIVE CONCENTRATION FOR MANY PATIENTS. HOWEVER, SOME ARE BEST TREATED AT CONCENTRATIONS OUTSIDE THIS RANGE. ACETAMINOPHEN CONCENTRATIONS >150 ug/mL AT 4 HOURS AFTER INGESTION AND >50 ug/mL AT 12 HOURS AFTER INGESTION ARE OFTEN ASSOCIATED WITH TOXIC REACTIONS.   Salicylate level     Status: None   Collection Time: 12/17/14  4:47 AM  Result Value Ref Range   Salicylate Lvl <1.6 2.8 - 30.0 mg/dL  Troponin I     Status: None   Collection Time: 12/17/14  4:47 AM  Result Value Ref Range   Troponin I <0.03 <0.031 ng/mL    Comment:        NO INDICATION OF MYOCARDIAL INJURY.   CK     Status: None   Collection Time: 12/17/14  4:47 AM  Result Value Ref Range   Total CK 53 38 - 234 U/L     Treatment Plan Summary: Daily contact with patient to assess and evaluate symptoms and progress in treatment and Medication management   44 y/o with bipolar d/o currently manic in the setting of med non compliance.  Bipolar d/o: continue olanzapine 20 mg po qhs and lamotrigine 200 mg po q day  Akathisia vrs Restless leg syndrome: will continue klonopin 0.5 mg q am and 1 mg po qhs  Labs: TSH, Lipid panel and HbA1c have been ordered  Precautions q 15 m checks  Hospitalization status:  continue IVC  Discharge planning: once stable she will return to her home and will continue to f/u with Dr. Nicolasa Ducking.  Medical Decision Making:  Established Problem, Worsening (2)  I certify that inpatient services furnished can reasonably be expected to improve the patient's condition.   Hildred Priest 7/14/20161:09 PM

## 2014-12-18 NOTE — B[facility] Counselor (Signed)
Authorization Request for Psych Inpt. Treatment from [place] completed and submitted. [id number].

## 2014-12-19 MED ORDER — AMANTADINE HCL 100 MG PO CAPS
100.0000 mg | ORAL_CAPSULE | Freq: Two times a day (BID) | ORAL | Status: DC
  Administered 2014-12-19 – 2014-12-23 (×9): 100 mg via ORAL
  Filled 2014-12-19 (×10): qty 1

## 2014-12-19 NOTE — Progress Notes (Signed)
D: Pt is awake in bed this evening. Pt mood is depressed and her affect is sad. Pt is denying SI/HI and AVH, and states her hallucinations have improved dramatically since her admission at [facility]. Pt is c/o insomnia and racing thoughtsbut acknowledges that she sleeps too much during the day. Pt also c/o restless legs and reports taking GABA and Requip in the past.   A: Writer provided emotional support and administered medications as prescribed. Writer encouraged pt to avoid naps during the day so that she can sleep better at night and participate more in unit activities. Writer also encouraged breathing exercises and meditation for her anxiety.  R: Pt receptive to input and is pleasant and cooperative with staff.

## 2014-12-19 NOTE — Plan of Care (Signed)
Problem: Diagnosis: Increased Risk For Suicide Attempt Goal: LTG-Patient Will Report Improved Mood and Deny Suicidal LTG (by discharge) Patient will report improved mood and deny suicidal ideation.  Outcome: Progressing Patient denies SI and expresses remorse and guilt for overdosing. States she "will never do that again."

## 2014-12-19 NOTE — [facility] Group Notes (Signed)
[facility] LCSW Aftercare Discharge Planning Group Note  12/19/2014 3:54 PM  Participation Quality:  did not attend group   Keene Breath, MSW, LCSWA 12/19/2014, 3:54 PM

## 2014-12-19 NOTE — Progress Notes (Signed)
Patient c/o restless legs and difficulty sleeping due to this. She appears restless and unable to stand still, like she is having akathesia. She says she does not feel that being here is helping her and asked about being discharged. Rates depression 5/10 and anxiety 7/10. Slept six hours and 45 minutes according to staff checks. She signed in voluntary per MD order. Has attended one group today but has been resting most of the shift. Will continue to monitor.

## 2014-12-19 NOTE — [facility] Group Notes (Signed)
[facility] Group Notes:  (Nursing/MHT/Case Management/Adjunct)  Date:  12/19/2014  Time:  11:58 AM  Type of Therapy:  Psychoeducational Skills  Participation Level:  Did Not Attend   Celso Amy 12/19/2014, 11:58 AM

## 2014-12-19 NOTE — Progress Notes (Signed)
[facility] MD Progress Note  12/19/2014 8:58 AM [name]  MRN:  [id number] Subjective:  Patient was very tearful this morning. She explains that she gets depressed every time she has to come to the hospital. She is ashamed and feels bad when she has to explain to people while she didn't go to work. Patient stated that she was able to sleep a little bit better last night but continues to feel very restless and has been pacing this morning. Patient continues to have pressured speech, and circumstantial thought processes.  Denies suicidality, homicidality or having auditory or visual hallucinations. Denies major problems with sleep, appetite, energy.  Denies other side effects from medications. Denies physical complaints.  Per nursing : Pt is awake in bed this evening. Pt mood is depressed and her affect is sad. Pt is denying SI/HI and AVH, and states her hallucinations have improved dramatically since her admission at [facility]. Pt is c/o insomnia and racing thoughtsbut acknowledges that she sleeps too much during the day. Pt also c/o restless legs and reports taking GABA and Requip in the past.   A: Writer provided emotional support and administered medications as prescribed. Writer encouraged pt to avoid naps during the day so that she can sleep better at night and participate more in unit activities. Writer also encouraged breathing exercises and meditation for her anxiety.  R: Pt receptive to input and is pleasant and cooperative with staff.   Principal Problem: Severe manic bipolar 1 disorder with psychotic behavior Diagnosis:   Patient Active Problem List   Diagnosis Date Noted  . Severe manic bipolar 1 disorder with psychotic behavior [F31.2] 12/18/2014  . Restless legs [G25.81] 12/17/2014  . Akathisia [R45.0] 12/17/2014   Total Time spent with patient: 30 minutes   Past Medical History:  Past Medical History  Diagnosis Date  . Bipolar affective   . Psychotic affective disorder   .  Restless leg syndrome    History reviewed. No pertinent past surgical history. Family History: History reviewed. No pertinent family history. Social History:  History  Alcohol Use No     History  Drug Use No    History   Social History  . Marital Status: Unknown    Spouse Name: N/A  . Number of Children: N/A  . Years of Education: N/A   Social History Main Topics  . Smoking status: Current Every Day Smoker -- 1.00 packs/day for 10 years    Types: Cigarettes  . Smokeless tobacco: Not on file  . Alcohol Use: No  . Drug Use: No  . Sexual Activity: Yes   Other Topics Concern  . None   Social History Narrative   Additional History:    Sleep: Good  Appetite:  Good   Assessment:   Musculoskeletal: Strength & Muscle Tone: within normal limits Gait & Station: normal Patient leans: N/A   Psychiatric Specialty Exam: Physical Exam  Review of Systems  Constitutional: Negative.   HENT: Negative.   Eyes: Negative.   Respiratory: Negative.   Cardiovascular: Negative.   Gastrointestinal: Negative.   Genitourinary: Negative.   Musculoskeletal: Negative.   Skin: Negative.   Neurological: Negative.   Endo/Heme/Allergies: Negative.   Psychiatric/Behavioral: Negative.     Blood pressure 134/90, pulse 96, temperature 98.5 F (36.9 C), temperature source Oral, resp. rate 20, height 5\' 2"  (1.575 m), weight 82.555 kg (182 lb), last menstrual period 12/17/2014, SpO2 98 %.Body mass index is 33.28 kg/(m^2).  General Appearance: Well Groomed  Eye Contact::  Good  Speech:  Pressured  Volume:  Normal  Mood:  Euphoric  Affect:  Congruent  Thought Process:  Circumstantial  Orientation:  Full (Time, Place, and Person)  Thought Content:  Hallucinations: None  Suicidal Thoughts:  No  Homicidal Thoughts:  No  Memory:  Immediate;   Good Recent;   Good Remote;   Good  Judgement:  Fair  Insight:  Fair  Psychomotor Activity:  Increased  Concentration:  Fair  Recall:  NA  Fund  of Knowledge:Good  Language: Good  Akathisia:  Yes  Handed:    AIMS (if indicated):     Assets:  Communication Skills Desire for Improvement Financial Resources/Insurance Housing Intimacy Physical Health Social Support Transportation Vocational/Educational  ADL's:  Intact  Cognition: WNL  Sleep:  Number of Hours: 6.25     Current Medications: Current Facility-Administered Medications  Medication Dose Route Frequency Provider Last Rate Last Dose  . acetaminophen (TYLENOL) tablet 650 mg  650 mg Oral Q6H PRN Gonzella Lex, MD      . alum & mag hydroxide-simeth (MAALOX/MYLANTA) 200-200-20 MG/5ML suspension 30 mL  30 mL Oral Q4H PRN Gonzella Lex, MD      . clonazePAM Bobbye [place]) tablet 0.5 mg  0.5 mg Oral Daily Hildred Priest, MD      . clonazePAM (KLONOPIN) tablet 1 mg  1 mg Oral QHS Hildred Priest, MD      . lamoTRIgine (LAMICTAL) tablet 25 mg  25 mg Oral Daily Gonzella Lex, MD   200 mg at 12/18/14 1232  . magnesium hydroxide (MILK OF MAGNESIA) suspension 30 mL  30 mL Oral Daily PRN Gonzella Lex, MD      . OLANZapine (ZYPREXA) tablet 20 mg  20 mg Oral QHS Gonzella Lex, MD   20 mg at 12/18/14 2129    Lab Results:  Results for orders placed or performed during the hospital encounter of 12/18/14 (from the past 48 hour(s))  Hemoglobin A1c     Status: None   Collection Time: 12/18/14  2:32 PM  Result Value Ref Range   Hgb A1c MFr Bld 5.4 4.0 - 6.0 %  Lipid panel, fasting     Status: Abnormal   Collection Time: 12/18/14  2:32 PM  Result Value Ref Range   Cholesterol 175 0 - 200 mg/dL   Triglycerides 153 (H) <150 mg/dL   HDL 45 >40 mg/dL   Total CHOL/HDL Ratio 3.9 RATIO   VLDL 31 0 - 40 mg/dL   LDL Cholesterol 99 0 - 99 mg/dL    Comment:        Total Cholesterol/HDL:CHD Risk Coronary Heart Disease Risk Table                     Men   Women  1/2 Average Risk   3.4   3.3  Average Risk       5.0   4.4  2 X Average Risk   9.6   7.1  3 X Average  Risk  23.4   11.0        Use the calculated Patient Ratio above and the CHD Risk Table to determine the patient's CHD Risk.        ATP III CLASSIFICATION (LDL):  <100     mg/dL   Optimal  100-129  mg/dL   Near or Above                    Optimal  130-159  mg/dL  Borderline  160-189  mg/dL   High  >190     mg/dL   Very High   TSH     Status: None   Collection Time: 12/18/14  2:32 PM  Result Value Ref Range   TSH 1.985 0.350 - 4.500 uIU/mL    Physical Findings: AIMS: Facial and Oral Movements Muscles of Facial Expression: None, normal Lips and Perioral Area: None, normal Jaw: None, normal Tongue: None, normal,Extremity Movements Upper (arms, wrists, hands, fingers): None, normal Lower (legs, knees, ankles, toes): None, normal, Trunk Movements Neck, shoulders, hips: None, normal, Overall Severity Severity of abnormal movements (highest score from questions above): None, normal Incapacitation due to abnormal movements: None, normal Patient's awareness of abnormal movements (rate only patient's report): Aware, no distress, Dental Status Current problems with teeth and/or dentures?: No Does patient usually wear dentures?: No  CIWA:  CIWA-Ar Total: 1 COWS:  COWS Total Score: 0  Treatment Plan Summary: Daily contact with patient to assess and evaluate symptoms and progress in treatment and Medication management   44 y/o with bipolar d/o currently manic in the setting of med non compliance.  Bipolar d/o: continue olanzapine 20 mg po qhs.  Lamotrigine was decreased yesterday to 25 mg after her pharmacy confirmed that the patient has not feel any prescriptions for Lamictal since March.  Akathisia vrs Restless leg syndrome: will continue klonopin 0.5 mg q am and 1 mg po qhs.  I will also start her today on amantadine 100 mg by mouth twice a day.  Labs: TSH wnl, Lipid panel mildly increased TG and HbA1c wnl  Precautions q 15 m checks  Hospitalization status: will make voluntary  today  Discharge planning: once stable she will return to her home and will continue to f/u with Dr. Nicolasa Ducking.   Medical Decision Making:  Established Problem, Stable/Improving (1)     Hildred Priest 12/19/2014, 8:58 AM

## 2014-12-20 DIAGNOSIS — F312 Bipolar disorder, current episode manic severe with psychotic features: Principal | ICD-10-CM

## 2014-12-20 MED ORDER — CLONAZEPAM 0.5 MG PO TABS
0.2500 mg | ORAL_TABLET | Freq: Two times a day (BID) | ORAL | Status: DC
  Administered 2014-12-20 – 2014-12-23 (×5): 0.25 mg via ORAL
  Filled 2014-12-20 (×7): qty 1

## 2014-12-20 NOTE — BHH Group Notes (Signed)
[facility] Group Notes:  (Nursing/MHT/Case Management/Adjunct)  Date:  12/20/2014  Time:  2:55 AM  Type of Therapy:  Group Therapy  Participation Level:  Active  Participation Quality:  Appropriate and Attentive  Affect:  Appropriate  Cognitive:  Appropriate  Insight:  Good  Engagement in Group:  Engaged  Modes of Intervention:  N/A  Summary of Progress/Problems:  [name] 12/20/2014, 2:55 AM

## 2014-12-20 NOTE — BHH Group Notes (Signed)
[facility] Group Notes:  (Nursing/MHT/Case Management/Adjunct)  Date:  12/20/2014  Time:  10:12 AM  Type of Therapy:  Group Therapy  Participation Level:  Active  Participation Quality:  Appropriate, Attentive, Sharing and Supportive  Affect:  Appropriate  Cognitive:  Alert, Appropriate and Oriented  Insight:  Appropriate  Engagement in Group:  Engaged  Modes of Intervention:  Discussion  Summary of Progress/Problems:  [name] [name] [name] 12/20/2014, 10:12 AM

## 2014-12-20 NOTE — Progress Notes (Signed)
[facility] MD Progress Note  12/20/2014 12:22 PM [name]  MRN:  [id number] Subjective:  Patient is a 44 year old female with history of bipolar disorder who was seen for follow-up. She reported that she continues to have racing thoughts and has been feeling very tired since she was started on Klonopin. She reported that she stopped taking all her medications except for lamotrigine as she has a 90 day supply of the medication. Patient reported that she has been following Dr. Jake Michaelis  as an outpatient basis. She came to the hospital as she was feeling depressed and was having conflict with a  Coworker. Denies suicidality, homicidality or having auditory or visual hallucinations.    Pt also c/o restless legs and reports taking GABA and Requip in the past.       Principal Problem: Severe manic bipolar 1 disorder with psychotic behavior Diagnosis:   Patient Active Problem List   Diagnosis Date Noted  . Severe manic bipolar 1 disorder with psychotic behavior [F31.2] 12/18/2014  . Restless legs [G25.81] 12/17/2014  . Akathisia [R45.0] 12/17/2014   Total Time spent with patient: 20 minutes   Past Medical History:  Past Medical History  Diagnosis Date  . Bipolar affective   . Psychotic affective disorder   . Restless leg syndrome    History reviewed. No pertinent past surgical history. Family History: History reviewed. No pertinent family history. Social History:  History  Alcohol Use No     History  Drug Use No    History   Social History  . Marital Status: Unknown    Spouse Name: N/A  . Number of Children: N/A  . Years of Education: N/A   Social History Main Topics  . Smoking status: Current Every Day Smoker -- 1.00 packs/day for 10 years    Types: Cigarettes  . Smokeless tobacco: Not on file  . Alcohol Use: No  . Drug Use: No  . Sexual Activity: Yes   Other Topics Concern  . None   Social History Narrative   Additional History:    Sleep: Good  Appetite:   Good   Assessment:   Musculoskeletal: Strength & Muscle Tone: within normal limits Gait & Station: normal Patient leans: N/A   Psychiatric Specialty Exam: Physical Exam   Review of Systems  Constitutional: Positive for malaise/fatigue.  HENT: Negative.   Eyes: Negative.  Negative for photophobia.  Respiratory: Negative.   Cardiovascular: Negative.  Negative for palpitations.  Gastrointestinal: Negative.  Negative for diarrhea.  Genitourinary: Negative.   Musculoskeletal: Negative.   Skin: Negative.   Neurological: Negative.  Negative for tremors.  Endo/Heme/Allergies: Negative.   Psychiatric/Behavioral: Positive for depression. Negative for substance abuse. The patient is nervous/anxious. The patient does not have insomnia.     Blood pressure 117/79, pulse 80, temperature 98.9 F (37.2 C), temperature source Oral, resp. rate 20, height 5\' 2"  (1.575 m), weight 182 lb (82.555 kg), last menstrual period 12/17/2014, SpO2 98 %.Body mass index is 33.28 kg/(m^2).  General Appearance: Well Groomed  Engineer, water::  Good  Speech:  Pressured  Volume:  Normal  Mood:  Euphoric  Affect:  Congruent  Thought Process:  Circumstantial  Orientation:  Full (Time, Place, and Person)  Thought Content:  Hallucinations: None  Suicidal Thoughts:  No  Homicidal Thoughts:  No  Memory:  Immediate;   Good Recent;   Good Remote;   Good  Judgement:  Fair  Insight:  Fair  Psychomotor Activity:  Increased  Concentration:  Fair  Recall:  NA  Fund of Knowledge:Good  Language: Good  Akathisia:  Yes  Handed:    AIMS (if indicated):     Assets:  Communication Skills Desire for Improvement Financial Resources/Insurance Housing Intimacy Physical Health Social Support Transportation Vocational/Educational  ADL's:  Intact  Cognition: WNL  Sleep:       Current Medications: Current Facility-Administered Medications  Medication Dose Route Frequency Provider Last Rate Last Dose  .  acetaminophen (TYLENOL) tablet 650 mg  650 mg Oral Q6H PRN Gonzella Lex, MD      . alum & mag hydroxide-simeth (MAALOX/MYLANTA) 200-200-20 MG/5ML suspension 30 mL  30 mL Oral Q4H PRN Gonzella Lex, MD      . amantadine (SYMMETREL) capsule 100 mg  100 mg Oral BID Hildred Priest, MD   100 mg at 12/20/14 1008  . clonazePAM (KLONOPIN) tablet 0.5 mg  0.5 mg Oral Daily Hildred Priest, MD   0.5 mg at 12/20/14 1008  . clonazePAM (KLONOPIN) tablet 1 mg  1 mg Oral QHS Hildred Priest, MD   1 mg at 12/19/14 2146  . lamoTRIgine (LAMICTAL) tablet 25 mg  25 mg Oral Daily Gonzella Lex, MD   25 mg at 12/20/14 1008  . magnesium hydroxide (MILK OF MAGNESIA) suspension 30 mL  30 mL Oral Daily PRN Gonzella Lex, MD      . OLANZapine (ZYPREXA) tablet 20 mg  20 mg Oral QHS Gonzella Lex, MD   20 mg at 12/19/14 2145    Lab Results:  Results for orders placed or performed during the hospital encounter of 12/18/14 (from the past 48 hour(s))  Hemoglobin A1c     Status: None   Collection Time: 12/18/14  2:32 PM  Result Value Ref Range   Hgb A1c MFr Bld 5.4 4.0 - 6.0 %  Lipid panel, fasting     Status: Abnormal   Collection Time: 12/18/14  2:32 PM  Result Value Ref Range   Cholesterol 175 0 - 200 mg/dL   Triglycerides 153 (H) <150 mg/dL   HDL 45 >40 mg/dL   Total CHOL/HDL Ratio 3.9 RATIO   VLDL 31 0 - 40 mg/dL   LDL Cholesterol 99 0 - 99 mg/dL    Comment:        Total Cholesterol/HDL:CHD Risk Coronary Heart Disease Risk Table                     Men   Women  1/2 Average Risk   3.4   3.3  Average Risk       5.0   4.4  2 X Average Risk   9.6   7.1  3 X Average Risk  23.4   11.0        Use the calculated Patient Ratio above and the CHD Risk Table to determine the patient's CHD Risk.        ATP III CLASSIFICATION (LDL):  <100     mg/dL   Optimal  100-129  mg/dL   Near or Above                    Optimal  130-159  mg/dL   Borderline  160-189  mg/dL   High  >190      mg/dL   Very High   TSH     Status: None   Collection Time: 12/18/14  2:32 PM  Result Value Ref Range   TSH 1.985 0.350 - 4.500 uIU/mL    Physical  Findings: AIMS: Facial and Oral Movements Muscles of Facial Expression: None, normal Lips and Perioral Area: None, normal Jaw: None, normal Tongue: None, normal,Extremity Movements Upper (arms, wrists, hands, fingers): None, normal Lower (legs, knees, ankles, toes): None, normal, Trunk Movements Neck, shoulders, hips: None, normal, Overall Severity Severity of abnormal movements (highest score from questions above): None, normal Incapacitation due to abnormal movements: None, normal Patient's awareness of abnormal movements (rate only patient's report): Aware, no distress, Dental Status Current problems with teeth and/or dentures?: No Does patient usually wear dentures?: No  CIWA:  CIWA-Ar Total: 1 COWS:  COWS Total Score: 0  Treatment Plan Summary: Daily contact with patient to assess and evaluate symptoms and progress in treatment and Medication management   44 y/o with bipolar d/o currently manic in the setting of med non compliance.  Bipolar d/o: continue olanzapine 20 mg po qhs.   Continue Lamotrigine 25mg  po qdaily.   Akathisia vrs Restless leg syndrome: will continue klonopin 0.25 mg BID as she is feeling very tired.   I will also continue her  on amantadine 100 mg by mouth twice a day.  Labs: TSH wnl, Lipid panel mildly increased TG and HbA1c wnl  Precautions q 15 m checks  Hospitalization status: will make voluntary today  Discharge planning: once stable she will return to her home and will continue to f/u with Dr. Nicolasa Ducking.   Medical Decision Making:  Established Problem, Stable/Improving (1)     Rainey Pines 12/20/2014, 12:22 PM

## 2014-12-20 NOTE — Progress Notes (Signed)
D) Patient pleasant and cooperative upon my assessment. Patient did not complete Self Inventory Assessment.  Patient denies SI/HI, denies A/V hallucinations.  Patient's affect is flat and mood is sad.  Reports that her appetite is good.   A) Patient offered support and encouragement, patient encouraged to discuss feelings/concerns with staff. Patient verbalized understanding. Patient monitored Q15 minutes for safety. Patient met with MD  to discuss today's goals and plan of care.  R) Patient visible in milieu, she  attended groups today.  Patient come to dining room for meals and snacks. Patient appropriate with staff and peers.   Patient taking medications as ordered. Will continue to monitor.

## 2014-12-20 NOTE — Plan of Care (Signed)
Problem: Ineffective individual coping Goal: LTG: Patient will report a decrease in negative feelings Outcome: Progressing Reports new med has helped, feels a little relief since having a little sleep.  Goal: STG: Patient will remain free from self harm Outcome: Progressing No self-harm noted.

## 2014-12-20 NOTE — B[facility] Group Notes (Signed)
[place] LCSW Group Therapy  12/20/2014 3:30 PM  Type of Therapy:  Group Therapy  Participation Level:  Active  Participation Quality:  Attentive  Affect:  Appropriate  Cognitive:  Alert  Insight:  Improving  Engagement in Therapy:  Improving  Modes of Intervention:  Discussion, Education, Socialization and Support  Summary of Progress/Problems: Patients identify obstacles, self-sabotaging and enabling behaviors. Patients explore aspects of self sabotage and enabling and how to limit these self-destructive behaviors in everyday life. [place] attended group and stayed the entire time. She discussed unhealthy thoughts about herself and how they effect her emotions and behaviors.   [place] MSW, [place]  12/20/2014, 3:30 PM

## 2014-12-20 NOTE — B[facility] Group Notes (Signed)
[place] LCSW Group Therapy  12/20/2014 9:22 AM  Type of Therapy:  Group Therapy  Participation Level:  Did Not Attend  Modes of Intervention:  Discussion, Education, Socialization and Support  Summary of Progress/Problems:Feelings around Relapse. Group members discussed the meaning of relapse and shared personal stories of relapse, how it affected them and others, and how they perceived themselves during this time. Group members were encouraged to identify triggers, warning signs and coping skills used when facing the possibility of relapse. Social supports were discussed and explored in detail.   [place] MSW, [place]  12/20/2014, 9:22 AM

## 2014-12-21 NOTE — Plan of Care (Signed)
Problem: Ineffective individual coping Goal: LTG: Patient will report a decrease in negative feelings Outcome: Progressing Patient notes having sleep has helped her mood and thought process.  Goal: STG: Pt will be able to identify effective and ineffective STG: Pt will be able to identify effective and ineffective coping patterns  Outcome: Progressing Pt working to determine the best way to handle her feelings and is identifying her triggers. Goal: STG: Patient will remain free from self harm Outcome: Progressing No self harm.

## 2014-12-21 NOTE — Progress Notes (Signed)
D: Pt denies SI/HI/AV. Pt is pleasant and cooperative. Patient interacting well with staff and peers. A: Pt was offered support and encouragement. Pt was given scheduled medications. Pt was encourage to attend groups. Q 15 minute checks were done for safety.  R:Pt attends groups and interacts well with peers and staff. Pt is taking medication. Pt has no complaints.Pt receptive to treatment and safety maintained on unit.

## 2014-12-21 NOTE — B[facility] Group Notes (Signed)
[place] LCSW Group Therapy  12/21/2014 6:57 PM  Type of Therapy:  Group Therapy  Participation Level:  Active  Participation Quality:  Attentive  Affect:  Appropriate  Cognitive:  Alert  Insight:  Improving  Engagement in Therapy:  Improving  Modes of Intervention:  Discussion, Education, Role-play, Socialization and Support  Summary of Progress/Problems: Communications: Patients identify how individuals communicate with one another appropriately and inappropriately. Patients will be guided to discuss their thoughts, feelings, and behaviors related to barriers when communicating. The group will process together ways to execute positive and appropriate communications [name] states she is feeling less drowsy today. She reports she was able to sleep the entire night. She discussed situations as her job that have caused her stress. She provided an example of "I" statements.   [place] MSW, [place]  12/21/2014, 6:57 PM

## 2014-12-21 NOTE — BHH Counselor (Signed)
Adult Comprehensive Assessment  Patient ID: [name], female   DOB: 04-06-1971, 44 y.o.   MRN: [id number]  Information Source: Information source: Patient  Current Stressors:  Educational / Learning stressors: None reported  Employment / Job issues: Pt works for a Designer, jewellery. She reports stress at job due to co-workers.  Family Relationships: Pt believes she is a burden to her family due to her many hospitalization.  Financial / Lack of resources (include bankruptcy): Limited income  Housing / Lack of housing: None reported  Physical health (include injuries & life threatening diseases): None reported  Social relationships: Limited social relationships  Substance abuse: Reports using marijuana in April.  Bereavement / Loss: None reported.   Living/Environment/Situation:  Living Arrangements: Spouse/significant other, Children Living conditions (as described by patient or guardian): Husband and 2 daughters  How long has patient lived in current situation?: 20 years  What is atmosphere in current home: Comfortable, Supportive, Loving  Family History:  Marital status: Married Number of Years Married: 86 What types of issues is patient dealing with in the relationship?: She believes she is a burden to her family due to her hospitalizations. She is insecure about their relationship.  Does patient have children?: Yes How many children?: 2 How is patient's relationship with their children?: 2 daughters ages 25 and 53. "Great"   Childhood History:  By whom was/is the patient raised?: Both parents Additional childhood history information: Parents were divorced when she was 54 years old.  Description of patient's relationship with caregiver when they were a child: "great"  Patient's description of current relationship with people who raised him/her: Parents are back together. "great relationship"  Does patient have siblings?: No Did patient suffer from severe childhood  neglect?: No Has patient ever been sexually abused/assaulted/raped as an adolescent or adult?: No Was the patient ever a victim of a crime or a disaster?: No Witnessed domestic violence?: No Has patient been effected by domestic violence as an adult?: No  Education:  Highest grade of school patient has completed: GED Currently a Ship broker?: No Learning disability?: No  Employment/Work Situation:   Employment situation: Employed Where is patient currently employed?: Education officer, environmental  How long has patient been employed?: 1.5 years Patient's job has been impacted by current illness: No What is the longest time patient has a held a job?: 5 years  Where was the patient employed at that time?: [employer]  Has patient ever been in the [employer]?: No Has patient ever served in Recruitment consultant?: No  Financial Resources:   Financial resources: Income from employment, Income from spouse Does patient have a representative payee or guardian?: No  Alcohol/Substance Abuse:   What has been your use of drugs/alcohol within the last 12 months?: reports smoking marijuana in April 2016 If attempted suicide, did drugs/alcohol play a role in this?: No Alcohol/Substance Abuse Treatment Hx: Denies past history Has alcohol/substance abuse ever caused legal problems?: No  Social Support System:   Pensions consultant Support System: Good Describe Community Support System: Family  Type of faith/religion: Christianity  How does patient's faith help to cope with current illness?: comfort   Leisure/Recreation:   Leisure and Hobbies: arts and crafts, walking, nature   Strengths/Needs:   What things does the patient do well?: friendly,understanding  In what areas does patient struggle / problems for patient: medications, coping skills, employement, bipolar disorder   Discharge Plan:   Does patient have access to transportation?: Yes Will patient be returning to same living situation after discharge?:  Yes Currently  receiving community mental health services: Yes (From Whom) (Dr. Nicolasa Ducking ) Does patient have financial barriers related to discharge medications?: Yes Patient description of barriers related to discharge medications: No insurance. Limited income.   Summary/Recommendations:   [name] is a 44 year old female who presented to [facility] with mania. She reports struggling with Biploar Disorder since 2008. She has been hospitalized at least 6 times since 2008. She is employed and currently lives with her husband and children in [place], [place]. [name] (Husband) [phone]. She reports being non compliant on her medications because they make her drowsy. She reports feeling like a burden to her family due to multiple hospitalizations. She receives medication management from  Dr. Nicolasa Ducking and would like a referral for therapy. Pt reports using marijuana in April 2016 but denies any other use. She plans to return home and receive outpatient services. Recommendations include; crisis stabilization, medication management therapeutic milieu, and encourage group attendance and participation.   [place].MSW, [facility]  12/21/2014

## 2014-12-21 NOTE — Progress Notes (Signed)
[facility] MD Progress Note  12/21/2014 11:41 AM [name]  MRN:  [id number] Subjective:  Patient is a 44 year old female with history of bipolar disorder who was seen for follow-up. She reported that she has noticed improvement on her medications since they were adjusted yesterday. She reported that she slept well last night. Patient reported that she is concerned about the cost of lamotrigine as it cost her a lot of money and she was talking about the dosage.  Denies suicidality, homicidality or having auditory or visual hallucinations.  Patient appeared much calmer and was able to communicate well during the interview.       Principal Problem: Severe manic bipolar 1 disorder with psychotic behavior Diagnosis:   Patient Active Problem List   Diagnosis Date Noted  . Severe manic bipolar 1 disorder with psychotic behavior [F31.2] 12/18/2014  . Restless legs [G25.81] 12/17/2014  . Akathisia [R45.0] 12/17/2014   Total Time spent with patient: 20 minutes   Past Medical History:  Past Medical History  Diagnosis Date  . Bipolar affective   . Psychotic affective disorder   . Restless leg syndrome    History reviewed. No pertinent past surgical history. Family History: History reviewed. No pertinent family history. Social History:  History  Alcohol Use No     History  Drug Use No    History   Social History  . Marital Status: Unknown    Spouse Name: N/A  . Number of Children: N/A  . Years of Education: N/A   Social History Main Topics  . Smoking status: Current Every Day Smoker -- 1.00 packs/day for 10 years    Types: Cigarettes  . Smokeless tobacco: Not on file  . Alcohol Use: No  . Drug Use: No  . Sexual Activity: Yes   Other Topics Concern  . None   Social History Narrative   Additional History:    Sleep: Good  Appetite:  Good   Assessment:   Musculoskeletal: Strength & Muscle Tone: within normal limits Gait & Station: normal Patient leans:  N/A   Psychiatric Specialty Exam: Physical Exam   Review of Systems  Constitutional: Positive for malaise/fatigue.  HENT: Negative.   Eyes: Negative.  Negative for photophobia.  Respiratory: Negative.   Cardiovascular: Negative.  Negative for palpitations.  Gastrointestinal: Negative.  Negative for diarrhea.  Genitourinary: Negative.   Musculoskeletal: Positive for myalgias.  Skin: Negative.   Neurological: Negative.  Negative for tremors.  Endo/Heme/Allergies: Negative.   Psychiatric/Behavioral: Positive for depression. Negative for substance abuse. The patient is nervous/anxious. The patient does not have insomnia.     Blood pressure 108/69, pulse 98, temperature 98.8 F (37.1 C), temperature source Oral, resp. rate 20, height 5\' 2"  (1.575 m), weight 182 lb (82.555 kg), last menstrual period 12/17/2014, SpO2 98 %.Body mass index is 33.28 kg/(m^2).  General Appearance: Well Groomed  Engineer, water::  Good  Speech:  Pressured  Volume:  Normal  Mood:  Anxious  Affect:  Congruent  Thought Process:  Circumstantial  Orientation:  Full (Time, Place, and Person)  Thought Content:  Hallucinations: None  Suicidal Thoughts:  No  Homicidal Thoughts:  No  Memory:  Immediate;   Good Recent;   Good Remote;   Good  Judgement:  Fair  Insight:  Fair  Psychomotor Activity:  Increased  Concentration:  Fair  Recall:  NA  Fund of Knowledge:Good  Language: Good  Akathisia:  Yes  Handed:    AIMS (if indicated):     Assets:  Communication Skills Desire for Improvement Financial Resources/Insurance Housing Intimacy Physical Health Social Support Transportation Vocational/Educational  ADL's:  Intact  Cognition: WNL  Sleep:       Current Medications: Current Facility-Administered Medications  Medication Dose Route Frequency Provider Last Rate Last Dose  . acetaminophen (TYLENOL) tablet 650 mg  650 mg Oral Q6H PRN Gonzella Lex, MD      . alum & mag hydroxide-simeth  (MAALOX/MYLANTA) 200-200-20 MG/5ML suspension 30 mL  30 mL Oral Q4H PRN Gonzella Lex, MD      . amantadine (SYMMETREL) capsule 100 mg  100 mg Oral BID Hildred Priest, MD   100 mg at 12/21/14 1114  . clonazePAM (KLONOPIN) tablet 0.25 mg  0.25 mg Oral BID Rainey Pines, MD   0.25 mg at 12/20/14 2201  . lamoTRIgine (LAMICTAL) tablet 25 mg  25 mg Oral Daily Gonzella Lex, MD   25 mg at 12/21/14 1114  . magnesium hydroxide (MILK OF MAGNESIA) suspension 30 mL  30 mL Oral Daily PRN Gonzella Lex, MD      . OLANZapine (ZYPREXA) tablet 20 mg  20 mg Oral QHS Gonzella Lex, MD   20 mg at 12/20/14 2201    Lab Results:  No results found for this or any previous visit (from the past 48 hour(s)).  Physical Findings: AIMS: Facial and Oral Movements Muscles of Facial Expression: None, normal Lips and Perioral Area: None, normal Jaw: None, normal Tongue: None, normal,Extremity Movements Upper (arms, wrists, hands, fingers): None, normal Lower (legs, knees, ankles, toes): None, normal, Trunk Movements Neck, shoulders, hips: None, normal, Overall Severity Severity of abnormal movements (highest score from questions above): None, normal Incapacitation due to abnormal movements: None, normal Patient's awareness of abnormal movements (rate only patient's report): Aware, no distress, Dental Status Current problems with teeth and/or dentures?: No Does patient usually wear dentures?: No  CIWA:  CIWA-Ar Total: 1 COWS:  COWS Total Score: 0  Treatment Plan Summary: Daily contact with patient to assess and evaluate symptoms and progress in treatment and Medication management   44 y/o with bipolar d/o currently manic in the setting of med non compliance.  Bipolar d/o: continue olanzapine 20 mg po qhs.   Continue Lamotrigine 25mg  po qdaily.   Akathisia vrs Restless leg syndrome: will continue klonopin 0.25 mg BID as she is feeling very tired.   I will also continue her  on amantadine 100 mg by  mouth twice a day.  Labs: TSH wnl, Lipid panel mildly increased TG and HbA1c wnl  Precautions q 15 m checks  Hospitalization status: will make voluntary today  Discharge planning: once stable she will return to her home and will continue to f/u with Dr. Nicolasa Ducking.   Medical Decision Making:  Established Problem, Stable/Improving (1)     Rainey Pines 12/21/2014, 11:41 AM

## 2014-12-21 NOTE — BHH Group Notes (Signed)
[facility] Group Notes:  (Nursing/MHT/Case Management/Adjunct)  Date:  12/21/2014  Time:  9:05 AM  Type of Therapy:  goal setting   Participation Level:  Active  Participation Quality:  Appropriate, Sharing and Supportive  Affect:  Flat  Cognitive:  Appropriate  Insight:  Appropriate  Engagement in Group:  Supportive  Modes of Intervention:  goal setting  Summary of Progress/Problems:  Celso Amy 12/21/2014, 9:05 AM

## 2014-12-21 NOTE — BHH Group Notes (Signed)
[facility] Group Notes:  (Nursing/MHT/Case Management/Adjunct)  Date:  12/21/2014  Time:  10:47 PM  Type of Therapy:  Group Therapy  Participation Level:  Active  Participation Quality:  Appropriate and Attentive  Affect:  Appropriate  Cognitive:  Alert and Appropriate  Insight:  Appropriate  Engagement in Group:  Engaged  Modes of Intervention:  Discussion  Summary of Progress/Problems:  [name] Joy Raheel Kunkle 12/21/2014, 10:47 PM

## 2014-12-22 NOTE — BHH Group Notes (Signed)
[facility] LCSW Aftercare Discharge Planning Group Note  12/22/2014 10:16 AM  Participation Quality:  Appropriate  Affect:  Appropriate  Cognitive:  Appropriate  Insight:  Engaged  Engagement in Group:  Engaged  Modes of Intervention:  Discussion, Education and Support  Summary of Progress/Problems: Patients goal is to feel better medically and to get well so she can go home  [place], [place] 12/22/2014, 10:16 AM

## 2014-12-22 NOTE — Progress Notes (Signed)
[facility] MD Progress Note  12/22/2014 11:30 AM [name]  MRN:  [id number] Subjective:  Patient is a 44 year old female with history of bipolar disorder who was seen for follow-up. Pt was seen in her room. She reported that she has noticed improvement on her medications since they were adjusted yesterday. She reported that she slept well last night. Patient reported that she is concerned about the cost of lamotrigine as it cost her a lot of money and she was talking about the dosage.  Denies suicidality, homicidality or having auditory or visual hallucinations. She reports that coming for Inpt has really helped her get better. Patient appeared much calmer and was able to communicate well during the interview. Pt was re-assured dn supportive therapy and coping skills and med - compliance discussed.       Principal Problem: Severe manic bipolar 1 disorder with psychotic behavior Diagnosis:   Patient Active Problem List   Diagnosis Date Noted  . Severe manic bipolar 1 disorder with psychotic behavior [F31.2] 12/18/2014  . Restless legs [G25.81] 12/17/2014  . Akathisia [R45.0] 12/17/2014   Total Time spent with patient: 30 minutes.   Past Medical History:  Past Medical History  Diagnosis Date  . Bipolar affective   . Psychotic affective disorder   . Restless leg syndrome    History reviewed. No pertinent past surgical history. Family History: History reviewed. No pertinent family history. Social History:  History  Alcohol Use No     History  Drug Use No    History   Social History  . Marital Status: Unknown    Spouse Name: N/A  . Number of Children: N/A  . Years of Education: N/A   Social History Main Topics  . Smoking status: Current Every Day Smoker -- 1.00 packs/day for 10 years    Types: Cigarettes  . Smokeless tobacco: Not on file  . Alcohol Use: No  . Drug Use: No  . Sexual Activity: Yes   Other Topics Concern  . None   Social History Narrative    Additional History:    Sleep: Good  Appetite:  Good   Assessment:   Musculoskeletal: Strength & Muscle Tone: within normal limits Gait & Station: normal Patient leans: N/A   Psychiatric Specialty Exam: Physical Exam  Review of Systems  Constitutional: Positive for malaise/fatigue.  HENT: Negative.   Eyes: Negative.  Negative for photophobia.  Respiratory: Negative.   Cardiovascular: Negative.  Negative for palpitations.  Gastrointestinal: Negative.  Negative for diarrhea.  Genitourinary: Negative.   Musculoskeletal: Positive for myalgias.  Skin: Negative.   Neurological: Negative.  Negative for tremors.  Endo/Heme/Allergies: Negative.   Psychiatric/Behavioral: Positive for depression. Negative for substance abuse. The patient is nervous/anxious. The patient does not have insomnia.     Blood pressure 108/68, pulse 97, temperature 98.3 F (36.8 C), temperature source Oral, resp. rate 20, height 5\' 2"  (1.575 m), weight 82.555 kg (182 lb), last menstrual period 12/17/2014, SpO2 98 %.Body mass index is 33.28 kg/(m^2).  General Appearance: Well Groomed  Engineer, water::  Good  Speech:  Pressured  Volume:  Normal  Mood:  Anxious  Affect:  Congruent  Thought Process:  Circumstantial  Orientation:  Full (Time, Place, and Person)  Thought Content:  Hallucinations: None  Suicidal Thoughts:  No  Homicidal Thoughts:  No  Memory:  Immediate;   Good Recent;   Good Remote;   Good  Judgement:  Fair  Insight:  Fair  Psychomotor Activity:  Increased  Concentration:  Fair  Recall:  NA  Fund of Knowledge:Good  Language: Good  Akathisia:  Yes  Handed:    AIMS (if indicated):     Assets:  Communication Skills Desire for Improvement Financial Resources/Insurance Housing Intimacy Physical Health Social Support Transportation Vocational/Educational  ADL's:  Intact  Cognition: WNL  Sleep:       Current Medications: Current Facility-Administered Medications  Medication  Dose Route Frequency Provider Last Rate Last Dose  . acetaminophen (TYLENOL) tablet 650 mg  650 mg Oral Q6H PRN Gonzella Lex, MD      . alum & mag hydroxide-simeth (MAALOX/MYLANTA) 200-200-20 MG/5ML suspension 30 mL  30 mL Oral Q4H PRN Gonzella Lex, MD      . amantadine (SYMMETREL) capsule 100 mg  100 mg Oral BID Hildred Priest, MD   100 mg at 12/22/14 1003  . clonazePAM (KLONOPIN) tablet 0.25 mg  0.25 mg Oral BID Rainey Pines, MD   0.25 mg at 12/22/14 1003  . lamoTRIgine (LAMICTAL) tablet 25 mg  25 mg Oral Daily Gonzella Lex, MD   25 mg at 12/22/14 1003  . magnesium hydroxide (MILK OF MAGNESIA) suspension 30 mL  30 mL Oral Daily PRN Gonzella Lex, MD      . OLANZapine (ZYPREXA) tablet 20 mg  20 mg Oral QHS Gonzella Lex, MD   20 mg at 12/21/14 2119    Lab Results:  No results found for this or any previous visit (from the past 48 hour(s)).  Physical Findings: AIMS: Facial and Oral Movements Muscles of Facial Expression: None, normal Lips and Perioral Area: None, normal Jaw: None, normal Tongue: None, normal,Extremity Movements Upper (arms, wrists, hands, fingers): None, normal Lower (legs, knees, ankles, toes): None, normal, Trunk Movements Neck, shoulders, hips: None, normal, Overall Severity Severity of abnormal movements (highest score from questions above): None, normal Incapacitation due to abnormal movements: None, normal Patient's awareness of abnormal movements (rate only patient's report): Aware, no distress, Dental Status Current problems with teeth and/or dentures?: No Does patient usually wear dentures?: No  CIWA:  CIWA-Ar Total: 1 COWS:  COWS Total Score: 0  Treatment Plan Summary: Daily contact with patient to assess and evaluate symptoms and progress in treatment and Medication management   44 y/o with bipolar d/o currently manic in the setting of med non compliance.  Bipolar d/o: continue olanzapine 20 mg po qhs.   Continue Lamotrigine 25mg  po  qdaily.   Akathisia vrs Restless leg syndrome: will continue klonopin 0.25 mg BID as she is feeling very tired.   I will also continue her  on amantadine 100 mg by mouth twice a day.  Labs: TSH wnl, Lipid panel mildly increased TG and HbA1c wnl  Precautions q 15 m checks  Hospitalization status: will make voluntary today  Discharge planning: once stable she will return to her home and will continue to f/u with Dr. Nicolasa Ducking.   Medical Decision Making:  Established Problem, Stable/Improving (1). Continue current meds and consider discharge in the next few days. Discussed pt with Staff and SS.     Savi Lastinger K 12/22/2014, 11:30 AM

## 2014-12-22 NOTE — Progress Notes (Signed)
D: Patient denies SI/HI/AVH.  Patient affect is anxious. Mood is pleasant.  Patient did attend evening group. Patient visible on the milieu. No distress noted. A: Support and encouragement offered. Scheduled medications given to pt. Q 15 min checks continued for patient safety. R: Patient receptive. Patient remains safe on the unit.

## 2014-12-22 NOTE — BHH Group Notes (Signed)
[facility] LCSW Group Therapy  12/22/2014 2:34 PM  Type of Therapy:  Group Therapy  Participation Level:  Active  Participation Quality:  Attentive  Affect:  Appropriate  Cognitive:  Appropriate  Insight:  Developing/Improving  Engagement in Therapy:  Developing/Improving  Modes of Intervention:  Discussion, Education, Exploration and Support  Summary of Progress/Problems: LCSW introduced group rules. The topic of emotional self regulation was introduced. Patients were asked to reflect on negative experiences and outcomes. A group exercise of "If I was five" what would I do to be happy. Patients shared a lot of personal stories and supported each other. Patients were then asked to reflect on healthier alternatives to cope with their negative emotions. This patient made good contributions in group today and supported her peers  [name] 12/22/2014, 2:34 PM

## 2014-12-22 NOTE — BHH Group Notes (Signed)
[facility] Group Notes:  (Nursing/MHT/Case Management/Adjunct)  Date:  12/22/2014  Time:  2:09 PM  Type of Therapy:  Psychoeducational Skills  Participation Level:  Minimal  Participation Quality:  Sharing and Supportive  Affect:  Anxious and Appropriate  Cognitive:  Appropriate and Oriented  Insight:  Improving  Engagement in Group:  Improving  Modes of Intervention:  Discussion, Exploration and Problem-solving  Summary of Progress/Problems:  Kathi Ludwig 12/22/2014, 2:09 PM

## 2014-12-23 MED ORDER — LAMOTRIGINE 25 MG PO TABS: 25.0000 mg | ORAL_TABLET | Freq: Every day | ORAL | Status: DC

## 2014-12-23 MED ORDER — AMANTADINE HCL 100 MG PO CAPS: 100.0000 mg | ORAL_CAPSULE | Freq: Two times a day (BID) | ORAL | Status: DC

## 2014-12-23 MED ORDER — OLANZAPINE 20 MG PO TABS: 20.0000 mg | ORAL_TABLET | Freq: Every day | ORAL | Status: DC

## 2014-12-23 MED ORDER — CLONAZEPAM 0.5 MG PO TABS: 0.2500 mg | ORAL_TABLET | Freq: Two times a day (BID) | ORAL | Status: DC

## 2014-12-23 NOTE — Progress Notes (Signed)
AVS H&P Discharge Summary faxed to [facility] for hospital follwow-up

## 2014-12-23 NOTE — BHH Group Notes (Signed)
[facility] LCSW Group Therapy  12/23/2014 1:15 PM  Type of Therapy:  Group Therapy  Participation Level:  Active  Participation Quality:  Attentive  Affect:  Appropriate  Cognitive:  Appropriate  Insight:  Improving  Engagement in Therapy:  Developing/Improving  Modes of Intervention:  Discussion, Education, Exploration and Support  Summary of Progress/Problems:LCSW introduced group rules and  group topic today was introducing Feelings about their diagnosis. Patients were asked to identify their diagnosis and reflect their feelings and reporting 2 symptoms they experience. With peer to peer role plays patients were asked to explain how they would answer questions to friends and neighbors/co-workers and family once they were discharged. Patients were given scenarios and answered which ever fit best for themselves. This patient was able to recognize her symptoms and effectively discuss her concerns and negative impact of others ignorance. Peers were supportive and patient was clear in her thoughts and feeling   Pauletta Browns, [place] 12/23/2014, 1:15 PM

## 2014-12-23 NOTE — Progress Notes (Signed)
D: Patient denies SI/HI/AVH.  Patient affect is anxious. Mood is pleasant.  Patient interaction with staff and other patients was appropriate. Patient did attend evening group. Patient visible on the milieu. No distress noted. A: Support and encouragement offered. Scheduled medications given to pt. Q 15 min checks continued for patient safety. R: Patient receptive. Patient remains safe on the unit.

## 2014-12-23 NOTE — Progress Notes (Signed)
Patient discharged home with mother. Instructions reviewed. Patient verbalized understanding of meds and follow up care. Denies SI/HI/AVH. Belongings returned.

## 2014-12-23 NOTE — Progress Notes (Signed)
  [facility] Adult Case Management Discharge Plan :  Will you be returning to the same living situation after discharge:  Yes,  home with husband At discharge, do you have transportation home?: Yes,  husband Do you have the ability to pay for your medications: Yes,     Release of information consent forms completed and in the chart;  Patient's signature needed at discharge.  Patient to Follow up at: Follow-up Information    Follow up with Dr. Cephus Shelling. Go on 12/24/2014.   Why:  3:30pm  , Hospital Follow up, Outpatient Medication Management, Please reschedule if unable to make appointment.   Contact information:   [address] [place] [address]       Follow up with [facility]. Go on 12/24/2014.   Why:  8:00am, Therapy, Please take your photo ID and Insurance card, Walk-ins Monday, Wednesday, Friday between 8am-3pm   Contact information:   [place] [address] Phone:[phone]      Patient denies SI/HI: Yes,       Safety Planning and Suicide Prevention discussed: Yes,     Have you used any form of tobacco in the last 30 days? (Cigarettes, Smokeless Tobacco, Cigars, and/or Pipes): Yes  Has patient been referred to the Quitline?: Patient refused referral   August Saucer, MSW, LCSW 12/23/2014, 10:12 AM

## 2014-12-23 NOTE — [facility] Suicide Risk Assessment (Signed)
[facility] Discharge Suicide Risk Assessment   Demographic Factors:  Pt is getting ready for a discharge today as she is stable and is doing well and improved and fine  Total Time spent with patient: 45 minutes Musculoskeletal: Strength & Muscle Tone: within normal limits Gait & Station: normal Patient leans: N/A  Psychiatric Specialty Exam: Physical Exam  Review of Systems  Constitutional: Negative.   HENT: Negative.   Eyes: Negative.   Respiratory: Negative.   Cardiovascular: Negative.   Gastrointestinal: Negative.   Genitourinary: Negative.   Musculoskeletal: Negative.   Skin: Negative.   Neurological: Negative.   Endo/Heme/Allergies: Negative.   Psychiatric/Behavioral: Negative.     Blood pressure 97/63, pulse 77, temperature 98.2 F (36.8 C), temperature source Oral, resp. rate 20, height 5\' 2"  (1.575 m), weight 82.555 kg (182 lb), last menstrual period 12/17/2014, SpO2 98 %.Body mass index is 33.28 kg/(m^2).  General Appearance: casual  Eye Contact::  good  Speech:  Clear and Coherent409  Volume:  Normal  Mood:  Euthymic  Affect:  Appropriate  Thought Process:  Coherent  Orientation:  Full (Time, Place, and Person)  Thought Content:  WDL  Suicidal Thoughts:  No  Homicidal Thoughts:  No  Memory:  Immediate;   Good Recent;   Good Remote;   Good adequate  Judgement:  Intact  Insight:  Good  Psychomotor Activity:  Normal  Concentration:  Good  Recall:  Good  Fund of Knowledge:Fair  Language: Good  Akathisia:  No  Handed:  Right  AIMS (if indicated):     Assets:  Communication Skills Desire for Improvement Financial Resources/Insurance Housing Physical Health Social Support Transportation Others:  adequate.  Sleep:  Number of Hours: 7.5  Cognition: WNL  ADL's:  Intact   Have you used any form of tobacco in the last 30 days? (Cigarettes, Smokeless Tobacco, Cigars, and/or Pipes): Yes  Has this patient used any form of tobacco in the last 30 days?  (Cigarettes, Smokeless Tobacco, Cigars, and/or Pipes) Yes, A prescription for an FDA-approved tobacco cessation medication was offered at discharge and the patient refused  Mental Status Per Nursing Assessment::   On Admission:  Self-harm thoughts (denies at this time )  Current Mental Status by Physician: NA  Loss Factors: NA  Historical Factors: NA  Risk Reduction Factors:   NA  Continued Clinical Symptoms:  Depression:   Severe  Cognitive Features That Contribute To Risk:  None    Suicide Risk:  Minimal: No identifiable suicidal ideation.  Patients presenting with no risk factors but with morbid ruminations; may be classified as minimal risk based on the severity of the depressive symptoms  Principal Problem: Severe manic bipolar 1 disorder with psychotic behavior Discharge Diagnoses:  Patient Active Problem List   Diagnosis Date Noted  . Severe manic bipolar 1 disorder with psychotic behavior [F31.2] 12/18/2014  . Restless legs [G25.81] 12/17/2014  . Akathisia [R45.0] 12/17/2014    Follow-up Information    Follow up with Dr. Cephus Shelling. Go on 12/24/2014.   Why:  3:30pm  , Hospital Follow up, Outpatient Medication Management, Please reschedule if unable to make appointment.   Contact information:   [address] [place] [address]       Follow up with [facility]. Go on 12/24/2014.   Why:  8:00am, Therapy, Please take your photo ID and Insurance card, Walk-ins Monday, Wednesday, Friday between 8am-3pm   Contact information:   [place] [address] Phone:[phone]  Plan Of Care/Follow-up recommendations:  Activity:  as tolerated  Is patient on multiple antipsychotic therapies at discharge:  No   Has Patient had three or more failed trials of antipsychotic monotherapy by history:  No  Recommended Plan for Multiple Antipsychotic Therapies: NA    Telly Broberg K 12/23/2014, 12:00 PM

## 2014-12-23 NOTE — BHH Suicide Risk Assessment (Signed)
[facility] INPATIENT:  Family/Significant Other Suicide Prevention Education  Suicide Prevention Education:  Education Completed; Samuella Cota, Husband,  (name of family member/significant other) has been identified by the patient as the family member/significant other with whom the patient will be residing, and identified as the person(s) who will aid the patient in the event of a mental health crisis (suicidal ideations/suicide attempt).  With written consent from the patient, the family member/significant other has been provided the following suicide prevention education, prior to the and/or following the discharge of the patient.  The suicide prevention education provided includes the following:  Suicide risk factors  Suicide prevention and interventions  National Suicide Hotline telephone number  [facility] assessment telephone number  [facility] Emergency Assistance [place] and/or Residential Mobile Crisis Unit telephone number  Request made of family/significant other to:  Remove weapons (e.g., guns, rifles, knives), all items previously/currently identified as safety concern.    Remove drugs/medications (over-the-counter, prescriptions, illicit drugs), all items previously/currently identified as a safety concern.  The family member/significant other verbalizes understanding of the suicide prevention education information provided.  The family member/significant other agrees to remove the items of safety concern listed above.  Dossie Arbour P,MSW, LCSW 12/23/2014, 10:11 AM

## 2014-12-23 NOTE — Progress Notes (Signed)
AVS H&P Discharge Summary faxed to [facility] for hospital follow-up

## 2014-12-23 NOTE — Discharge Summary (Signed)
Physician Discharge Summary Note  Patient:  [name] is an 44 y.o., female with a long H/o depression and came here for help. MRN:  [id number] DOB:  [birth year] Patient phone:  [phone] (home)  Patient address:   [place] [address],  Total Time spent with patient: 45 minutes  Date of Admission:  12/18/2014 Date of Discharge: 12/23/2014  Reason for Admission:  Depression with S/I  Principal Problem: Severe manic bipolar 1 disorder with psychotic behavior Discharge Diagnoses: Patient Active Problem List   Diagnosis Date Noted  . Severe manic bipolar 1 disorder with psychotic behavior [F31.2] 12/18/2014  . Restless legs [G25.81] 12/17/2014  . Akathisia [R45.0] 12/17/2014    Musculoskeletal: Strength & Muscle Tone: within normal limits Gait & Station: normal Patient leans: N/A  Psychiatric Specialty Exam: Physical Exam  Nursing note and vitals reviewed.   Review of Systems  All other systems reviewed and are negative.   Blood pressure 97/63, pulse 77, temperature 98.2 F (36.8 C), temperature source Oral, resp. rate 20, height 5\' 2"  (1.575 m), weight 82.555 kg (182 lb), last menstrual period 12/17/2014, SpO2 98 %.Body mass index is 33.28 kg/(m^2).  See SRA.                                                  Sleep:  Number of Hours: 7.5   Have you used any form of tobacco in the last 30 days? (Cigarettes, Smokeless Tobacco, Cigars, and/or Pipes): Yes  Has this patient used any form of tobacco in the last 30 days? (Cigarettes, Smokeless Tobacco, Cigars, and/or Pipes) Yes, A prescription for an FDA-approved tobacco cessation medication was offered at discharge and the patient refused  Past Medical History:  Past Medical History  Diagnosis Date  . Bipolar affective   . Psychotic affective disorder   . Restless leg syndrome    History reviewed. No pertinent past surgical history. Family History: History reviewed. No  pertinent family history. Social History:  History  Alcohol Use No     History  Drug Use No    History   Social History  . Marital Status: Unknown    Spouse Name: N/A  . Number of Children: N/A  . Years of Education: N/A   Social History Main Topics  . Smoking status: Current Every Day Smoker -- 1.00 packs/day for 10 years    Types: Cigarettes  . Smokeless tobacco: Not on file  . Alcohol Use: No  . Drug Use: No  . Sexual Activity: Yes   Other Topics Concern  . None   Social History Narrative    Past Psychiatric History: Hospitalizations:  Outpatient Care:  Substance Abuse Care:  Self-Mutilation:  Suicidal Attempts:  Violent Behaviors:   Risk to Self: Is patient at risk for suicide?: No What has been your use of drugs/alcohol within the last 12 months?: reports smoking marijuana in April 2016 Risk to Others:   Prior Inpatient Therapy:   Prior Outpatient Therapy:    Level of Care:  OP  Hospital Course:    44 y/o with bipolar d/o currently manic in the setting of med non compliance.  Bipolar d/o: continue olanzapine 20 mg po qhs.  Continue Lamotrigine 25mg  po qdaily.   Akathisia vrs Restless leg syndrome: will continue klonopin 0.25 mg BID as she is feeling very tired.  I will also continue her on amantadine 100 mg by mouth twice a day.  Labs: TSH wnl, Lipid panel mildly increased TG and HbA1c wnl  Precautions q 15 m checks  Hospitalization status: will make voluntary today  Discharge planning: once stable she will return to her home and will continue to f/u with Dr. Nicolasa Ducking.   Consults:  None  Significant Diagnostic Studies:  None  Discharge Vitals:   Blood pressure 97/63, pulse 77, temperature 98.2 F (36.8 C), temperature source Oral, resp. rate 20, height 5\' 2"  (1.575 m), weight 82.555 kg (182 lb), last menstrual period 12/17/2014, SpO2 98 %. Body mass index is 33.28 kg/(m^2). Lab Results:   No results found for this or any previous visit  (from the past 72 hour(s)).  Physical Findings: AIMS: Facial and Oral Movements Muscles of Facial Expression: None, normal Lips and Perioral Area: None, normal Jaw: None, normal Tongue: None, normal,Extremity Movements Upper (arms, wrists, hands, fingers): None, normal Lower (legs, knees, ankles, toes): None, normal, Trunk Movements Neck, shoulders, hips: None, normal, Overall Severity Severity of abnormal movements (highest score from questions above): None, normal Incapacitation due to abnormal movements: None, normal Patient's awareness of abnormal movements (rate only patient's report): Aware, no distress, Dental Status Current problems with teeth and/or dentures?: No Does patient usually wear dentures?: No  CIWA:  CIWA-Ar Total: 1 COWS:  COWS Total Score: 0   See Psychiatric Specialty Exam and Suicide Risk Assessment completed by Attending Physician prior to discharge.  Discharge destination:  Home  Is patient on multiple antipsychotic therapies at discharge:  No   Has Patient had three or more failed trials of antipsychotic monotherapy by history:  No    Recommended Plan for Multiple Antipsychotic Therapies: NA  Discharge Instructions    Diet - low sodium heart healthy    Complete by:  As directed      Increase activity slowly    Complete by:  As directed             Medication List    STOP taking these medications        benztropine 0.5 MG tablet  Commonly known as:  COGENTIN     lamoTRIgine 100 MG tablet  Commonly known as:  LAMICTAL  Replaced by:  lamoTRIgine 25 MG tablet      TAKE these medications      Indication   amantadine 100 MG capsule  Commonly known as:  SYMMETREL  Take 1 capsule (100 mg total) by mouth 2 (two) times daily.   Indication:  Extrapyramidal Reaction caused by Medications     clonazePAM 0.5 MG tablet  Commonly known as:  KLONOPIN  Take 0.5 tablets (0.25 mg total) by mouth 2 (two) times daily.   Indication:  Panic Disorder      lamoTRIgine 25 MG tablet  Commonly known as:  LAMICTAL  Take 1 tablet (25 mg total) by mouth daily.      OLANZapine 20 MG tablet  Commonly known as:  ZYPREXA  Take 1 tablet (20 mg total) by mouth at bedtime.   Indication:  Major Depressive Disorder           Follow-up Information    Follow up with Dr. Cephus Shelling. Go on 12/24/2014.   Why:  3:30pm  , Hospital Follow up, Outpatient Medication Management, Please reschedule if unable to make appointment.   Contact information:   [place] [address]       Follow  up with [facility]. Go on 12/24/2014.   Why:  8:00am, Therapy, Please take your photo ID and Insurance card, Walk-ins Monday, Wednesday, Friday between 8am-3pm   Contact information:   [place] [address] [phone]      Follow-up recommendations:  Activity:  as tolerated  Comments:  Follow up appts and meds as recommended.  Total Discharge Time: 45 mins  Signed: Dewain Penning 12/23/2014, 12:09 PM

## 2017-06-19 ENCOUNTER — Ambulatory Visit (INDEPENDENT_AMBULATORY_CARE_PROVIDER_SITE_OTHER): Payer: 59 | Admitting: Psychiatry

## 2017-06-19 ENCOUNTER — Encounter (HOSPITAL_COMMUNITY): Payer: Self-pay | Admitting: Psychiatry

## 2017-06-19 VITALS — BP 122/76 | HR 77 | Ht 62.0 in | Wt 211.6 lb

## 2017-06-19 DIAGNOSIS — F319 Bipolar disorder, unspecified: Secondary | ICD-10-CM | POA: Diagnosis not present

## 2017-06-19 DIAGNOSIS — Z62811 Personal history of psychological abuse in childhood: Secondary | ICD-10-CM

## 2017-06-19 DIAGNOSIS — Z813 Family history of other psychoactive substance abuse and dependence: Secondary | ICD-10-CM

## 2017-06-19 DIAGNOSIS — F4310 Post-traumatic stress disorder, unspecified: Secondary | ICD-10-CM

## 2017-06-19 DIAGNOSIS — Z62810 Personal history of physical and sexual abuse in childhood: Secondary | ICD-10-CM

## 2017-06-19 DIAGNOSIS — F419 Anxiety disorder, unspecified: Secondary | ICD-10-CM | POA: Diagnosis not present

## 2017-06-19 DIAGNOSIS — R450 Nervousness: Secondary | ICD-10-CM

## 2017-06-19 DIAGNOSIS — F4010 Social phobia, unspecified: Secondary | ICD-10-CM

## 2017-06-19 DIAGNOSIS — F17210 Nicotine dependence, cigarettes, uncomplicated: Secondary | ICD-10-CM

## 2017-06-19 MED ORDER — BENZTROPINE MESYLATE 0.5 MG PO TABS: 0.5000 mg | ORAL_TABLET | Freq: Every day | ORAL | 1 refills | Status: DC

## 2017-06-19 MED ORDER — ARIPIPRAZOLE 10 MG PO TABS: 10.0000 mg | ORAL_TABLET | Freq: Every day | ORAL | 1 refills | Status: DC
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
## 2017-07-28 ENCOUNTER — Encounter (HOSPITAL_COMMUNITY): Payer: Self-pay | Admitting: Psychiatry

## 2017-07-28 ENCOUNTER — Ambulatory Visit (HOSPITAL_COMMUNITY): Payer: 59 | Admitting: Psychiatry

## 2017-07-28 DIAGNOSIS — Z79899 Other long term (current) drug therapy: Secondary | ICD-10-CM

## 2017-07-28 DIAGNOSIS — Z566 Other physical and mental strain related to work: Secondary | ICD-10-CM | POA: Diagnosis not present

## 2017-07-28 DIAGNOSIS — Z813 Family history of other psychoactive substance abuse and dependence: Secondary | ICD-10-CM

## 2017-07-28 DIAGNOSIS — F17210 Nicotine dependence, cigarettes, uncomplicated: Secondary | ICD-10-CM

## 2017-07-28 DIAGNOSIS — F4310 Post-traumatic stress disorder, unspecified: Secondary | ICD-10-CM | POA: Diagnosis not present

## 2017-07-28 DIAGNOSIS — Z915 Personal history of self-harm: Secondary | ICD-10-CM

## 2017-07-28 DIAGNOSIS — E669 Obesity, unspecified: Secondary | ICD-10-CM | POA: Insufficient documentation

## 2017-07-28 DIAGNOSIS — F419 Anxiety disorder, unspecified: Secondary | ICD-10-CM | POA: Diagnosis not present

## 2017-07-28 DIAGNOSIS — F319 Bipolar disorder, unspecified: Secondary | ICD-10-CM

## 2017-07-28 DIAGNOSIS — I509 Heart failure, unspecified: Secondary | ICD-10-CM | POA: Insufficient documentation

## 2017-07-28 MED ORDER — ARIPIPRAZOLE 10 MG PO TABS: 10.0000 mg | ORAL_TABLET | Freq: Every day | ORAL | 0 refills | Status: DC

## 2017-07-28 MED ORDER — BENZTROPINE MESYLATE 0.5 MG PO TABS: 0.5000 mg | ORAL_TABLET | Freq: Every day | ORAL | 0 refills | Status: DC

## 2017-07-28 MED ORDER — CLONAZEPAM 0.5 MG PO TABS: 0.5000 mg | ORAL_TABLET | Freq: Every day | ORAL | 0 refills | Status: DC

## 2017-07-28 NOTE — Progress Notes (Signed)
[name] MD/PA/NP OP Progress Note  07/28/2017 9:44 AM [name]  MRN:  [id number]  Chief Complaint: I am doing better on increase Abilify.  I do not have restless leg but I still get some time anxious and nervous.  HPI: [name] is 47 year old Caucasian married employed female who was seen first time 4 weeks ago.  She is seeing Dr. Jake Michaelis but she was not happy with her and needed a new physician.  She is been complaining of restless leg which she believed due to Lamictal.  We discontinued Lamictal and she see a huge improvement in her restless leg.  She is sleeping better.  We recommended increase Abilify and we added low-dose Cogentin.  I do believe she may have akathisia which helps but the Cogentin.  She is sleeping better but she continues to have anxiety and nervousness.  She is overwhelmed with her job.  She works for at [employer].  She describes her mood is stable but she gets anxious at work.  She is no longer taking equate because she feel increasing Abilify to help her restless leg.  She excited because her daughter is graduating from nursing school.  Patient denies any paranoia, hallucination, mania or any psychosis.  She admitted weight gain and she is working on it.  She apologized not going to [place] but promised that she will try.  She feels some time boredom with lack of socialization.  She lives with her husband for 37 years.  Her son lives with them.  Her daughter lives close by.  Patient denies drinking alcohol or using any illegal substances.  Patient denies any suicidal thoughts or homicidal thought.  Received records from Dr. Jake Michaelis which I reviewed with the patient.  Patient has a history of PTSD but currently denies any nightmares or any flashback.  Visit Diagnosis:    ICD-10-CM   1. Bipolar I disorder (HCC) F31.9 benztropine (COGENTIN) 0.5 MG tablet    clonazePAM (KLONOPIN) 0.5 MG tablet    ARIPiprazole (ABILIFY) 10 MG tablet    Past  Psychiatric History:  Patient has history of bipolar disorder and PTSD.  Her first hospitalization was in 2007 and then she had 5 more hospitalization.  Her last hospitalization was in 2016 at [facility].  She has history of mania, psychosis, suicidal attempt by taking overdose on medication.  In the past she had tried olanzapine, lithium, Lamictal, amantadine and Klonopin.  She was seeing Dr. Jake Michaelis since 2010.  Past Medical History:  Past Medical History:  Diagnosis Date  . Bipolar affective ([place])   . Restless leg syndrome   . Schizoaffective disorder [facility])     Past Surgical History:  Procedure Laterality Date  . CESAREAN SECTION      Family Psychiatric History: Reviewed.  Family History:  Family History  Problem Relation Age of Onset  . Drug abuse Maternal Aunt     Social History:  Social History   Socioeconomic History  . Marital status: Married    Spouse name: [name]  . Number of children: 2  . Years of education: 22  . Highest education level: Some college, no degree  Social Needs  . Financial resource strain: Not hard at all  . Food insecurity - worry: Never true  . Food insecurity - inability: Never true  . Transportation needs - medical: No  . Transportation needs - non-medical: No  Occupational History  . Not on file  Tobacco Use  . Smoking status: Current  Every Day Smoker    Packs/day: 1.00    Years: 10.00    Pack years: 10.00    Types: Cigarettes  . Smokeless tobacco: Never Used  Substance and Sexual Activity  . Alcohol use: No  . Drug use: No  . Sexual activity: Yes  Other Topics Concern  . Not on file  Social History Narrative  . Not on file    Allergies: No Known Allergies  Metabolic Disorder Labs: Lab Results  Component Value Date   HGBA1C 5.4 12/18/2014   No results found for: PROLACTIN Lab Results  Component Value Date   CHOL 175 12/18/2014   TRIG 153 (H) 12/18/2014   HDL 45 12/18/2014   CHOLHDL 3.9 12/18/2014    VLDL 31 12/18/2014   LDLCALC 99 12/18/2014   Lab Results  Component Value Date   TSH 1.985 12/18/2014   TSH 0.94 08/22/2013    Therapeutic Level Labs: No results found for: LITHIUM No results found for: VALPROATE No components found for:  CBMZ  Current Medications: Current Outpatient Medications  Medication Sig Dispense Refill  . ARIPiprazole (ABILIFY) 10 MG tablet Take 1 tablet (10 mg total) by mouth daily. 30 tablet 1  . benztropine (COGENTIN) 0.5 MG tablet Take 1 tablet (0.5 mg total) by mouth at bedtime. 30 tablet 1  . clonazePAM (KLONOPIN) 0.5 MG tablet Take 0.5 tablets (0.25 mg total) by mouth 2 (two) times daily. (Patient not taking: Reported on 06/19/2017) 30 tablet 0   No current facility-administered medications for this visit.      Musculoskeletal: Strength & Muscle Tone: within normal limits Gait & Station: normal Patient leans: N/A  Psychiatric Specialty Exam: Review of Systems  Constitutional: Negative.   HENT: Negative.   Eyes: Negative.   Respiratory: Negative.   Gastrointestinal: Negative.   Musculoskeletal: Negative.   Skin: Negative.   Neurological: Negative.   Psychiatric/Behavioral: The patient is nervous/anxious.     Blood pressure 121/76, pulse 83, height 5\' 2"  (1.575 m), weight 207 lb (93.9 kg).There is no height or weight on file to calculate BMI.  General Appearance: Casual  Eye Contact:  Good  Speech:  Clear and Coherent  Volume:  Normal  Mood:  Anxious  Affect:  Congruent  Thought Process:  Goal Directed  Orientation:  Full (Time, Place, and Person)  Thought Content: Logical   Suicidal Thoughts:  No  Homicidal Thoughts:  No  Memory:  Immediate;   Good Recent;   Good Remote;   Good  Judgement:  Good  Insight:  Good  Psychomotor Activity:  Normal  Concentration:  Concentration: Good and Attention Span: Good  Recall:  Good  Fund of Knowledge: Good  Language: Good  Akathisia:  No  Handed:  Right  AIMS (if indicated): not done   Assets:  Communication Skills Desire for Improvement Housing Resilience Social Support  ADL's:  Intact  Cognition: WNL  Sleep:  Fair   Screenings: AIMS     Admission (Discharged) from 12/18/2014 in [place] Total Score  1    AUDIT     Admission (Discharged) from 12/18/2014 in [place]  Alcohol Use Disorder Identification Test Final Score (AUDIT)  0       Assessment and Plan: Bipolar disorder type I.  PTSD.  Anxiety disorder NOS.  I reviewed records from Dr. Jake Michaelis with the patient.  Her PTSD is a stable and she does not want any more medication.  She is no longer taking Requip  for restless leg since she noticed increase Abilify help.  I encourage to continue Abilify 10 mg daily and Cogentin 0.5 mg at bedtime.  She is not taking Klonopin which was prescribed by neurologist because she has not seen him in a while.  She admitted anxiety and nervousness due to job.  We will resume Klonopin 0.5 mg to take half to 1 tablet as needed for anxiety.  We discussed benzodiazepine dependence, tolerance and withdrawal.  Encourage weight loss and watch her calorie intake.  Encouraged to start [facility] mental health Association groups.  Patient does not feel necessary to see neurology since her restless leg is improved.  Discussed medication side effects and benefits.  Recommended to call us back if she has any question or any concern.  Follow-up in 2 months.  Time spent 25 minutes.  More than 50% of the time spent in psychoeducation, counseling, coronation, reviewing collateral information and long-term prognosis.   Kathlee Nations, MD 07/28/2017, 9:44 AM

## 2017-08-28 ENCOUNTER — Other Ambulatory Visit (HOSPITAL_COMMUNITY): Payer: Self-pay | Admitting: Psychiatry

## 2017-08-28 ENCOUNTER — Telehealth (HOSPITAL_COMMUNITY): Payer: Self-pay

## 2017-08-28 NOTE — Telephone Encounter (Signed)
Patient called and said that the cogentin is not working for her restless legs. Please review and advise, thank you

## 2017-08-28 NOTE — Telephone Encounter (Signed)
She can try Requip 0.25 mg at bedtime for restless leg.

## 2017-08-29 MED ORDER — ROPINIROLE HCL 0.25 MG PO TABS: 0.2500 mg | ORAL_TABLET | Freq: Every day | ORAL | 0 refills | Status: DC

## 2017-09-28 ENCOUNTER — Encounter (HOSPITAL_COMMUNITY): Payer: Self-pay | Admitting: Psychiatry

## 2017-09-28 ENCOUNTER — Ambulatory Visit (HOSPITAL_COMMUNITY): Payer: 59 | Admitting: Psychiatry

## 2017-09-28 VITALS — BP 121/88 | HR 79 | Ht 62.0 in | Wt 202.0 lb

## 2017-09-28 DIAGNOSIS — F419 Anxiety disorder, unspecified: Secondary | ICD-10-CM

## 2017-09-28 DIAGNOSIS — Z915 Personal history of self-harm: Secondary | ICD-10-CM | POA: Diagnosis not present

## 2017-09-28 DIAGNOSIS — F319 Bipolar disorder, unspecified: Secondary | ICD-10-CM | POA: Diagnosis not present

## 2017-09-28 DIAGNOSIS — F17210 Nicotine dependence, cigarettes, uncomplicated: Secondary | ICD-10-CM

## 2017-09-28 DIAGNOSIS — Z813 Family history of other psychoactive substance abuse and dependence: Secondary | ICD-10-CM | POA: Diagnosis not present

## 2017-09-28 DIAGNOSIS — G2581 Restless legs syndrome: Secondary | ICD-10-CM | POA: Diagnosis not present

## 2017-09-28 DIAGNOSIS — F4310 Post-traumatic stress disorder, unspecified: Secondary | ICD-10-CM

## 2017-09-28 MED ORDER — BENZTROPINE MESYLATE 0.5 MG PO TABS: 0.5000 mg | ORAL_TABLET | Freq: Every day | ORAL | 0 refills | Status: DC

## 2017-09-28 MED ORDER — ROPINIROLE HCL 0.5 MG PO TABS: ORAL_TABLET | ORAL | 2 refills | Status: DC

## 2017-09-28 MED ORDER — ARIPIPRAZOLE 15 MG PO TABS: 15.0000 mg | ORAL_TABLET | Freq: Every day | ORAL | 0 refills | Status: DC

## 2017-09-28 NOTE — Progress Notes (Signed)
[facility] MD/PA/NP OP Progress Note  09/28/2017 8:08 AM [name]  MRN:  [id number]  Chief Complaint: I do not think Requip is a strong.  I still have restless leg.  I still have mood swings but I feel Abilify working.  HPI: [name] came for her follow-up appointment.  She was started on Requip 0.25 mg for restless leg.  Initially she thought restless leg does not by Lamictal.  We stopped the Lamictal but she continued to have the symptoms.  We increase Abilify dose on her last visit and she feels improvement in her mood and irritability.  She is stressed about her current job.  She is trying to get lateral position in the company.  She works at [employer].  She describes her sleep is good but she has a lot of restless leg movement in the night.  She feels Requip and Abilify does need to be increased.  She also taking Cogentin.  She has no rash, tremors or shakes.  She denies any hallucination, paranoia or any suicidal thoughts.  She is pleased that she lost weight 7 pounds since the last visit.  She is actively working out and watching her calorie counts.  Due to her busy schedule she can go to [place].  Patient denies any crying spells, feeling of hopelessness or any suicidal thoughts.  She rarely takes Klonopin.  She lives with her husband for 37 years.  Patient denies drinking alcohol or using any illegal substances.  Her appetite is okay.  Her energy level is good.  Visit Diagnosis:    ICD-10-CM   1. Bipolar I disorder (HCC) F31.9 ARIPiprazole (ABILIFY) 15 MG tablet    benztropine (COGENTIN) 0.5 MG tablet  2. RLS (restless legs syndrome) G25.81 rOPINIRole (REQUIP) 0.5 MG tablet    Past Psychiatric History: Reviewed Patient has history of bipolar disorder and PTSD.  Her first hospitalization was in 2007 and then she had 5 more hospitalization.  Her last hospitalization was in 2016 at [facility].  She has history of mania, psychosis,  suicidal attempt by taking overdose on medication.  In the past she had tried olanzapine, lithium, Lamictal, amantadine and Klonopin.  She stopped Lamictal because she felt it was causing restless leg. She was seeing Dr. Jake Michaelis since 2010.   Past Medical History:  Past Medical History:  Diagnosis Date  . Bipolar affective ([place])   . Restless leg syndrome   . Schizoaffective disorder [facility])     Past Surgical History:  Procedure Laterality Date  . CESAREAN SECTION      Family Psychiatric History: Reviewed.  Family History:  Family History  Problem Relation Age of Onset  . Drug abuse Maternal Aunt     Social History:  Social History   Socioeconomic History  . Marital status: Married    Spouse name: [name]  . Number of children: 2  . Years of education: 55  . Highest education level: Some college, no degree  Occupational History  . Not on file  Social Needs  . Financial resource strain: Not hard at all  . Food insecurity:    Worry: Never true    Inability: Never true  . Transportation needs:    Medical: No    Non-medical: No  Tobacco Use  . Smoking status: Current Every Day Smoker    Packs/day: 1.00    Years: 10.00    Pack years: 10.00    Types: Cigarettes  . Smokeless tobacco: Never Used  Substance and Sexual Activity  . Alcohol use: No  . Drug use: No  . Sexual activity: Yes  Lifestyle  . Physical activity:    Days per week: 0 days    Minutes per session: 0 min  . Stress: To some extent  Relationships  . Social connections:    Talks on phone: More than three times a week    Gets together: Once a week    Attends religious service: Never    Active member of club or organization: No    Attends meetings of clubs or organizations: Never    Relationship status: Married  Other Topics Concern  . Not on file  Social History Narrative  . Not on file    Allergies: No Known Allergies  Metabolic Disorder Labs: Lab Results  Component Value Date   HGBA1C 5.4  12/18/2014   No results found for: PROLACTIN Lab Results  Component Value Date   CHOL 175 12/18/2014   TRIG 153 (H) 12/18/2014   HDL 45 12/18/2014   CHOLHDL 3.9 12/18/2014   VLDL 31 12/18/2014   LDLCALC 99 12/18/2014   Lab Results  Component Value Date   TSH 1.985 12/18/2014   TSH 0.94 08/22/2013    Therapeutic Level Labs: No results found for: LITHIUM No results found for: VALPROATE No components found for:  CBMZ  Current Medications: Current Outpatient Medications  Medication Sig Dispense Refill  . ARIPiprazole (ABILIFY) 10 MG tablet Take 1 tablet (10 mg total) by mouth daily. 90 tablet 0  . benztropine (COGENTIN) 0.5 MG tablet Take 1 tablet (0.5 mg total) by mouth at bedtime. 90 tablet 0  . clonazePAM (KLONOPIN) 0.5 MG tablet Take 1 tablet (0.5 mg total) by mouth at bedtime. 30 tablet 0  . rOPINIRole (REQUIP) 0.25 MG tablet Take 1 tablet (0.25 mg total) by mouth at bedtime. 30 tablet 0   No current facility-administered medications for this visit.      Musculoskeletal: Strength & Muscle Tone: within normal limits Gait & Station: normal Patient leans: N/A  Psychiatric Specialty Exam: ROS  Blood pressure 121/88, pulse 79, height 5\' 2"  (1.575 m), weight 202 lb (91.6 kg), SpO2 97 %.Body mass index is 36.95 kg/m.  General Appearance: Casual  Eye Contact:  Good  Speech:  Clear and Coherent  Volume:  Normal  Mood:  Anxious  Affect:  Congruent  Thought Process:  Goal Directed  Orientation:  Full (Time, Place, and Person)  Thought Content: Logical   Suicidal Thoughts:  No  Homicidal Thoughts:  No  Memory:  Immediate;   Good Recent;   Good Remote;   Good  Judgement:  Good  Insight:  Good  Psychomotor Activity:  Normal  Concentration:  Concentration: Fair and Attention Span: Fair  Recall:  Good  Fund of Knowledge: Good  Language: Good  Akathisia:  No  Handed:  Right  AIMS (if indicated): not done  Assets:  Communication Skills Desire for  [place] Talents/Skills  ADL's:  Intact  Cognition: WNL  Sleep:  Fair   Screenings: AIMS     Admission (Discharged) from 12/18/2014 in [place] Total Score  1    AUDIT     Admission (Discharged) from 12/18/2014 in [place]  Alcohol Use Disorder Identification Test Final Score (AUDIT)  0       Assessment and Plan: Bipolar disorder type I.  Anxiety disorder NOS.  PTSD.  Patient doing better on her current  medication.  Her PTSD symptoms are stable.  She has no nightmares and flashback.  She like to try higher dose of Abilify to help her anxiety and restless leg.  She feels Requip is not strong and she is wondering if dose can further increase.  She is actively looking for a better job in the company.  I recommended to try Abilify 15 mg at bedtime, continue Cogentin 0.5 mg at bedtime and we will increase Requip 0.5 mg take 1 to 2 tablet at bedtime for restless leg.  If her restless leg does not improve we will recommend to see neurology.  Patient does not need a new prescription of Klonopin.  Discussed medication side effects and benefits.  Recommended to call us back if she has any question or any concern.  Follow-up in 3 months.  Patient has no tremors, rash, itching or shakes.  Encourage healthy lifestyle.   Kathlee Nations, MD 09/28/2017, 8:08 AM

## 2017-12-28 ENCOUNTER — Ambulatory Visit (HOSPITAL_COMMUNITY): Payer: Self-pay | Admitting: Psychiatry

## 2018-01-10 ENCOUNTER — Other Ambulatory Visit (HOSPITAL_COMMUNITY): Payer: Self-pay

## 2018-01-10 DIAGNOSIS — F319 Bipolar disorder, unspecified: Secondary | ICD-10-CM

## 2018-01-10 DIAGNOSIS — G2581 Restless legs syndrome: Secondary | ICD-10-CM

## 2018-01-10 MED ORDER — ARIPIPRAZOLE 15 MG PO TABS
15.0000 mg | ORAL_TABLET | Freq: Every day | ORAL | 0 refills | Status: DC
Start: 2018-01-10 — End: 2018-01-20

## 2018-01-10 MED ORDER — BENZTROPINE MESYLATE 0.5 MG PO TABS
0.5000 mg | ORAL_TABLET | Freq: Every day | ORAL | 0 refills | Status: DC
Start: 2018-01-10 — End: 2018-01-20

## 2018-01-10 MED ORDER — ROPINIROLE HCL 0.5 MG PO TABS: ORAL_TABLET | ORAL | 0 refills | Status: DC

## 2018-01-13 ENCOUNTER — Ambulatory Visit (HOSPITAL_COMMUNITY): Payer: Self-pay | Admitting: Psychiatry

## 2018-01-20 ENCOUNTER — Other Ambulatory Visit: Payer: Self-pay

## 2018-01-20 ENCOUNTER — Ambulatory Visit (INDEPENDENT_AMBULATORY_CARE_PROVIDER_SITE_OTHER): Payer: 59 | Admitting: Psychiatry

## 2018-01-20 ENCOUNTER — Encounter (HOSPITAL_COMMUNITY): Payer: Self-pay | Admitting: Psychiatry

## 2018-01-20 DIAGNOSIS — G2581 Restless legs syndrome: Secondary | ICD-10-CM

## 2018-01-20 DIAGNOSIS — F319 Bipolar disorder, unspecified: Secondary | ICD-10-CM | POA: Diagnosis not present

## 2018-01-20 DIAGNOSIS — Z811 Family history of alcohol abuse and dependence: Secondary | ICD-10-CM

## 2018-01-20 DIAGNOSIS — F17210 Nicotine dependence, cigarettes, uncomplicated: Secondary | ICD-10-CM | POA: Diagnosis not present

## 2018-01-20 MED ORDER — CLONAZEPAM 0.5 MG PO TABS: 0.5000 mg | ORAL_TABLET | Freq: Two times a day (BID) | ORAL | 2 refills | Status: DC | PRN

## 2018-01-20 MED ORDER — ARIPIPRAZOLE 15 MG PO TABS: 15.0000 mg | ORAL_TABLET | ORAL | 0 refills | Status: DC

## 2018-01-20 MED ORDER — ROPINIROLE HCL 1 MG PO TABS: 1.0000 mg | ORAL_TABLET | Freq: Every day | ORAL | 0 refills | Status: DC
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
## 2018-01-25 ENCOUNTER — Ambulatory Visit (HOSPITAL_COMMUNITY): Payer: Self-pay | Admitting: Psychiatry

## 2018-02-13 ENCOUNTER — Ambulatory Visit: Payer: 59 | Admitting: Psychology

## 2018-02-27 ENCOUNTER — Ambulatory Visit: Payer: Self-pay | Admitting: Psychology

## 2018-02-27 ENCOUNTER — Ambulatory Visit (HOSPITAL_COMMUNITY): Payer: Self-pay | Admitting: Psychiatry

## 2018-03-08 ENCOUNTER — Ambulatory Visit (INDEPENDENT_AMBULATORY_CARE_PROVIDER_SITE_OTHER): Payer: 59 | Admitting: Psychiatry

## 2018-03-08 ENCOUNTER — Other Ambulatory Visit: Payer: Self-pay

## 2018-03-08 ENCOUNTER — Encounter (HOSPITAL_COMMUNITY): Payer: Self-pay | Admitting: Psychiatry

## 2018-03-08 DIAGNOSIS — F319 Bipolar disorder, unspecified: Secondary | ICD-10-CM

## 2018-03-08 DIAGNOSIS — F419 Anxiety disorder, unspecified: Secondary | ICD-10-CM | POA: Diagnosis not present

## 2018-03-08 DIAGNOSIS — F17210 Nicotine dependence, cigarettes, uncomplicated: Secondary | ICD-10-CM

## 2018-03-08 MED ORDER — ARIPIPRAZOLE 15 MG PO TABS: 15.0000 mg | ORAL_TABLET | ORAL | 0 refills | Status: DC

## 2018-03-08 MED ORDER — CLONAZEPAM 0.5 MG PO TABS: 0.5000 mg | ORAL_TABLET | Freq: Every day | ORAL | 2 refills | Status: DC

## 2018-03-08 NOTE — Progress Notes (Signed)
[facility] MD/PA/NP OP Progress Note  03/08/2018 3:12 PM [name]  MRN:  [id number]  Chief Complaint: I am feeling better with the Klonopin.  I do not take Cogentin and Requip.  My restless leg is gone.  HPI: [name] came for her follow-up appointment.  She is taking Abilify which was increased back in April and that helps her depression and believes that also helps her restless leg.  She saw Dr. Sharlene Patrick who started her on Klonopin.  She endorsed that she has anxiety and she takes Klonopin as needed.  She is still actively looking for a better job.  She had a interview next week.  She denies any paranoia, hallucination, suicidal thoughts or homicidal thought.  She struggles with her weight loss but promised that she will start walking to lose some weight.  She was recommended to do counseling but due to her high co-pay she cannot afford counseling at this time.  Patient lives with her husband for 37 years.  Patient denies drinking or using any illegal substances.  She wants to continue current medication.  Visit Diagnosis:    ICD-10-CM   1. Bipolar I disorder (HCC) F31.9 clonazePAM (KLONOPIN) 0.5 MG tablet    ARIPiprazole (ABILIFY) 15 MG tablet    Past Psychiatric History: Reviewed Patient has history of bipolar disorder and PTSD. Her first hospitalization was in 2007 and then she had 5 more hospitalization. Her last hospitalization was in 2016 at [facility]. She has history of mania, psychosis, suicidal attempt by taking overdose on medication. In the past she had tried olanzapine, lithium, Lamictal, amantadine and Klonopin.  She stopped Lamictal because she felt it was causing restless leg.She was seeing Dr. Jake Patrick since 2010.  Past Medical History:  Past Medical History:  Diagnosis Date  . Bipolar affective ([place])   . Restless leg syndrome   . Schizoaffective disorder [facility])     Past Surgical History:  Procedure Laterality Date  . CESAREAN SECTION      Family  Psychiatric History: Reviewed  Family History:  Family History  Problem Relation Age of Onset  . Drug abuse Maternal Aunt     Social History:  Social History   Socioeconomic History  . Marital status: Married    Spouse name: [name]  . Number of children: 2  . Years of education: 48  . Highest education level: Some college, no degree  Occupational History  . Not on file  Social Needs  . Financial resource strain: Not hard at all  . Food insecurity:    Worry: Never true    Inability: Never true  . Transportation needs:    Medical: No    Non-medical: No  Tobacco Use  . Smoking status: Current Every Day Smoker    Packs/day: 1.00    Years: 10.00    Pack years: 10.00    Types: Cigarettes  . Smokeless tobacco: Never Used  Substance and Sexual Activity  . Alcohol use: No  . Drug use: No  . Sexual activity: Yes  Lifestyle  . Physical activity:    Days per week: 0 days    Minutes per session: 0 min  . Stress: To some extent  Relationships  . Social connections:    Talks on phone: More than three times a week    Gets together: Once a week    Attends religious service: Never    Active member of club or organization: No    Attends meetings of clubs or organizations: Never  Relationship status: Married  Other Topics Concern  . Not on file  Social History Narrative  . Not on file    Allergies: No Known Allergies  Metabolic Disorder Labs: Lab Results  Component Value Date   HGBA1C 5.4 12/18/2014   No results found for: PROLACTIN Lab Results  Component Value Date   CHOL 175 12/18/2014   TRIG 153 (H) 12/18/2014   HDL 45 12/18/2014   CHOLHDL 3.9 12/18/2014   VLDL 31 12/18/2014   LDLCALC 99 12/18/2014   Lab Results  Component Value Date   TSH 1.985 12/18/2014   TSH 0.94 08/22/2013    Therapeutic Level Labs: No results found for: LITHIUM No results found for: VALPROATE No components found for:  CBMZ  Current Medications: Current Outpatient Medications   Medication Sig Dispense Refill  . ARIPiprazole (ABILIFY) 15 MG tablet Take 1 tablet (15 mg total) by mouth every morning. 90 tablet 0  . clonazePAM (KLONOPIN) 0.5 MG tablet Take 1 tablet (0.5 mg total) by mouth 2 (two) times daily as needed for anxiety. 60 tablet 2  . rOPINIRole (REQUIP) 1 MG tablet Take 1 tablet (1 mg total) by mouth at bedtime. 90 tablet 0   No current facility-administered medications for this visit.      Musculoskeletal: Strength & Muscle Tone: within normal limits Gait & Station: normal Patient leans: N/A  Psychiatric Specialty Exam: ROS  There were no vitals taken for this visit.There is no height or weight on file to calculate BMI.  General Appearance: Casual  Eye Contact:  Good  Speech:  Clear and Coherent  Volume:  Normal  Mood:  Anxious  Affect:  Congruent  Thought Process:  Goal Directed  Orientation:  Full (Time, Place, and Person)  Thought Content: Logical   Suicidal Thoughts:  No  Homicidal Thoughts:  No  Memory:  Immediate;   Good Recent;   Good Remote;   Good  Judgement:  Good  Insight:  Good  Psychomotor Activity:  Normal  Concentration:  Concentration: Good and Attention Span: Good  Recall:  Good  Fund of Knowledge: Good  Language: Good  Akathisia:  No  Handed:  Right  AIMS (if indicated): not done  Assets:  Communication Skills Desire for Improvement Housing  ADL's:  Intact  Cognition: WNL  Sleep:  Good   Screenings: AIMS     Admission (Discharged) from 12/18/2014 in [place] Total Score  1    AUDIT     Admission (Discharged) from 12/18/2014 in [place]  Alcohol Use Disorder Identification Test Final Score (AUDIT)  0       Assessment and Plan: Bipolar disorder type I.  Anxiety disorder NOS.  Discontinue Cogentin and Requip since patient is no longer taking it.  We discussed benzodiazepine dependence tolerance and withdrawal.  Recommended to take Klonopin only as  needed for severe anxiety.  Patient is feeling better since we increased the Abilify.  Continue Abilify 15 mg at bedtime and Klonopin 0.5 mg as needed for severe anxiety.  Recommended to call us back if she has any question or any concern.  Encourage healthy lifestyle.  Follow-up in 3 months.     Kathlee Nations, MD 03/08/2018, 3:12 PM

## 2018-06-07 ENCOUNTER — Ambulatory Visit (HOSPITAL_COMMUNITY): Payer: 59 | Admitting: Psychiatry

## 2018-06-07 ENCOUNTER — Other Ambulatory Visit (HOSPITAL_COMMUNITY): Payer: Self-pay

## 2018-06-07 DIAGNOSIS — F319 Bipolar disorder, unspecified: Secondary | ICD-10-CM

## 2018-06-07 MED ORDER — ARIPIPRAZOLE 15 MG PO TABS: 15.0000 mg | ORAL_TABLET | ORAL | 0 refills | Status: DC

## 2018-07-07 ENCOUNTER — Encounter (HOSPITAL_COMMUNITY): Payer: Self-pay | Admitting: Psychiatry

## 2018-07-07 ENCOUNTER — Ambulatory Visit (HOSPITAL_COMMUNITY): Payer: 59 | Admitting: Psychiatry

## 2018-07-07 VITALS — BP 132/78 | HR 74 | Ht 62.0 in | Wt 199.0 lb

## 2018-07-07 DIAGNOSIS — F411 Generalized anxiety disorder: Secondary | ICD-10-CM | POA: Diagnosis not present

## 2018-07-07 DIAGNOSIS — G2581 Restless legs syndrome: Secondary | ICD-10-CM | POA: Diagnosis not present

## 2018-07-07 DIAGNOSIS — F319 Bipolar disorder, unspecified: Secondary | ICD-10-CM

## 2018-07-07 MED ORDER — BENZTROPINE MESYLATE 1 MG PO TABS: 1.0000 mg | ORAL_TABLET | Freq: Every day | ORAL | 1 refills | Status: DC

## 2018-07-07 MED ORDER — BENZTROPINE MESYLATE 1 MG PO TABS: 1.0000 mg | ORAL_TABLET | Freq: Two times a day (BID) | ORAL | 1 refills | Status: DC

## 2018-07-07 NOTE — Progress Notes (Signed)
[facility] MD/PA/NP OP Progress Note  07/07/2018 8:54 AM [name]  MRN:  [id number]  Chief Complaint: I still have issues with my restless leg.  I do not think Requip is working.  HPI: [name] came for her follow-up appointment.  She is taking Abilify which is helping her mood and manic symptoms but she is struggle with restless leg.  We tried Requip which she takes on and off but continued to have trouble.  Some nights she does not sleep as good.  She takes Klonopin for her sleep and anxiety but she admitted does not take every day because she is scared to get addicted.  We have recommended to see neurology but she had not made that appointment.  Her job is sometime very stressful.  She has to sit on the chair for a long time and that also causes the restlessness.  She works with IT consultant.  She denies any paranoia, hallucination or any aggressive behavior.  She like the Klonopin because it is helping her mood but wondering what else she can take to help her restless leg.  Tried Cogentin but she admitted did not take long enough to see the response.  She noticed her restlessness more since the Abilify dose increase.  Patient denies drinking or using any illegal substances.  She has no nightmares or flashbacks.  She denies drinking or using any illegal substances.  She is trying to lose weight and she lost few pounds since her last visit.   Visit Diagnosis:    ICD-10-CM   1. RLS (restless legs syndrome) G25.81 benztropine (COGENTIN) 1 MG tablet    DISCONTINUED: benztropine (COGENTIN) 1 MG tablet  2. Bipolar I disorder (HCC) F31.9 benztropine (COGENTIN) 1 MG tablet    DISCONTINUED: benztropine (COGENTIN) 1 MG tablet  3. GAD (generalized anxiety disorder) F41.1 benztropine (COGENTIN) 1 MG tablet    Past Psychiatric History: Reviewed H/O bipolar disorder and PTSD. Multiple hospitalization and last hospitalization was in 2016 at [facility]. H/O mania, psychosis, suicidal attempt  with overdose. Tried olanzapine, lithium, Lamictal, amantadine and Klonopin.Stopped Lamictal because she felt it was causing restless leg.Saw Dr. Jake Michaelis since 2010.  Past Medical History:  Past Medical History:  Diagnosis Date  . Bipolar affective ([place])   . Restless leg syndrome   . Schizoaffective disorder [facility])     Past Surgical History:  Procedure Laterality Date  . CESAREAN SECTION      Family Psychiatric History: Reviewed.  Family History:  Family History  Problem Relation Age of Onset  . Drug abuse Maternal Aunt     Social History:  Social History   Socioeconomic History  . Marital status: Married    Spouse name: [name]  . Number of children: 2  . Years of education: 41  . Highest education level: Some college, no degree  Occupational History  . Not on file  Social Needs  . Financial resource strain: Not hard at all  . Food insecurity:    Worry: Never true    Inability: Never true  . Transportation needs:    Medical: No    Non-medical: No  Tobacco Use  . Smoking status: Current Every Day Smoker    Packs/day: 0.75    Years: 10.00    Pack years: 7.50    Types: Cigarettes  . Smokeless tobacco: Never Used  Substance and Sexual Activity  . Alcohol use: No  . Drug use: No  . Sexual activity: Yes  Lifestyle  . Physical activity:  Days per week: 0 days    Minutes per session: 0 min  . Stress: To some extent  Relationships  . Social connections:    Talks on phone: More than three times a week    Gets together: Once a week    Attends religious service: Never    Active member of club or organization: No    Attends meetings of clubs or organizations: Never    Relationship status: Married  Other Topics Concern  . Not on file  Social History Narrative  . Not on file    Allergies: No Known Allergies  Metabolic Disorder Labs: Lab Results  Component Value Date   HGBA1C 5.4 12/18/2014   No results found for: PROLACTIN Lab Results  Component Value  Date   CHOL 175 12/18/2014   TRIG 153 (H) 12/18/2014   HDL 45 12/18/2014   CHOLHDL 3.9 12/18/2014   VLDL 31 12/18/2014   LDLCALC 99 12/18/2014   Lab Results  Component Value Date   TSH 1.985 12/18/2014   TSH 0.94 08/22/2013    Therapeutic Level Labs: No results found for: LITHIUM No results found for: VALPROATE No components found for:  CBMZ  Current Medications: Current Outpatient Medications  Medication Sig Dispense Refill  . ARIPiprazole (ABILIFY) 15 MG tablet Take 1 tablet (15 mg total) by mouth every morning. 90 tablet 0  . clonazePAM (KLONOPIN) 0.5 MG tablet Take 1 tablet (0.5 mg total) by mouth at bedtime. 30 tablet 2  . rOPINIRole (REQUIP) 1 MG tablet Take 1 tablet (1 mg total) by mouth at bedtime. 90 tablet 0   No current facility-administered medications for this visit.      Musculoskeletal: Strength & Muscle Tone: within normal limits Gait & Station: normal Patient leans: N/A  Psychiatric Specialty Exam: Review of Systems  Neurological:       RLS    Blood pressure 132/78, pulse 74, height 5\' 2"  (1.575 m), weight 199 lb (90.3 kg).Body mass index is 36.4 kg/m.  General Appearance: Casual  Eye Contact:  Fair  Speech:  Clear and Coherent  Volume:  Normal  Mood:  Anxious  Affect:  Congruent  Thought Process:  Descriptions of Associations: Intact  Orientation:  Full (Time, Place, and Person)  Thought Content: Rumination   Suicidal Thoughts:  No  Homicidal Thoughts:  No  Memory:  Immediate;   Good Recent;   Good Remote;   Fair  Judgement:  Fair  Insight:  Good  Psychomotor Activity:  Restlessness  Concentration:  Concentration: Fair and Attention Span: Fair  Recall:  [employer] of Knowledge: Fair  Language: Good  Akathisia:  Complaint of restlessness  Handed:  Right  AIMS (if indicated): not done  Assets:  Communication Skills Desire for Improvement Housing Talents/Skills  ADL's:  Intact  Cognition: WNL  Sleep:  Fair   Screenings: AIMS      Admission (Discharged) from 12/18/2014 in [place] Total Score  1    AUDIT     Admission (Discharged) from 12/18/2014 in [place]  Alcohol Use Disorder Identification Test Final Score (AUDIT)  0       Assessment and Plan: Bipolar disorder type I.  Generalized anxiety disorder.  Restless leg.  Patient mention her mood is good but she is concerned about her restless leg.  We talked about possibility of akathisia and EPS from Abilify as patient noticed increased symptoms since the Abilify dose increased to 15 mg.  Described Cogentin  but admitted did not give enough time for Cogentin.  She reported Requip did not help as much.  I recommended to try Cogentin again 1 mg and if it helped her restlessness.  She also requesting a letter to have a standing desk at her work because prolonged sitting because her restless leg worsening.  I also encouraged to see neurology if Cogentin did not help her restlessness.  We also talked about possibility of Lamictal and reducing the Abilify.  Patient was taking Lamictal but she stopped when she believe Lamictal causing restless legs.  Now she agreed that Lamictal has nothing to do with the restless leg but she is not sure if she want to go back on Lamictal as her mood is stable.  Patient agree with the plan.  We will provide a letter for standing desk which can alleviate some of her restless leg.  I also encouraged to cancel appointment with neurology if Cogentin did not help.  Patient is not strict for therapy due to high co-pay.  Recommended to call us back if she has any question or any concern.  I will see her again in 2 months.  She has enough Klonopin which she takes only as needed and does not require any new prescription.     Kathlee Nations, MD 07/07/2018, 8:54 AM

## 2018-07-19 ENCOUNTER — Telehealth (HOSPITAL_COMMUNITY): Payer: Self-pay | Admitting: Professional

## 2018-07-19 NOTE — Telephone Encounter (Signed)
D:  Pt called and left vm inquiring about MH-IOP.  A:  Returned call to pt.  Oriented pt to MH-IOP.  Pt declining at this time due to her high insurance deductible.  Informed pt about [facility] [place].  Pt mentioned her main stressor is her job and to "please send up a prayer" for her.  Inform Dr. Adele Schilder.  R:  Pt receptive.

## 2018-07-20 ENCOUNTER — Ambulatory Visit (HOSPITAL_COMMUNITY): Payer: 59 | Admitting: Psychiatry

## 2018-07-24 ENCOUNTER — Ambulatory Visit (HOSPITAL_COMMUNITY): Payer: 59 | Admitting: Psychiatry

## 2018-09-07 ENCOUNTER — Ambulatory Visit (INDEPENDENT_AMBULATORY_CARE_PROVIDER_SITE_OTHER): Payer: 59 | Admitting: Psychiatry

## 2018-09-07 ENCOUNTER — Other Ambulatory Visit: Payer: Self-pay

## 2018-09-07 DIAGNOSIS — Z79899 Other long term (current) drug therapy: Secondary | ICD-10-CM

## 2018-09-07 DIAGNOSIS — F17210 Nicotine dependence, cigarettes, uncomplicated: Secondary | ICD-10-CM | POA: Diagnosis not present

## 2018-09-07 DIAGNOSIS — G2581 Restless legs syndrome: Secondary | ICD-10-CM

## 2018-09-07 DIAGNOSIS — F411 Generalized anxiety disorder: Secondary | ICD-10-CM | POA: Diagnosis not present

## 2018-09-07 DIAGNOSIS — F319 Bipolar disorder, unspecified: Secondary | ICD-10-CM | POA: Diagnosis not present

## 2018-09-07 MED ORDER — BENZTROPINE MESYLATE 1 MG PO TABS: 1.0000 mg | ORAL_TABLET | Freq: Every day | ORAL | 0 refills | Status: DC

## 2018-09-07 MED ORDER — ARIPIPRAZOLE 15 MG PO TABS: 15.0000 mg | ORAL_TABLET | ORAL | 0 refills | Status: DC

## 2018-09-07 NOTE — Progress Notes (Signed)
Virtual Visit via Telephone Note  I connected with [name] on 09/07/18 at  8:40 AM EDT by telephone and verified that I am speaking with the correct person using two identifiers.   I discussed the limitations, risks, security and privacy concerns of performing an evaluation and management service by telephone and the availability of in person appointments. I also discussed with the patient that there may be a patient responsible charge related to this service. The patient expressed understanding and agreed to proceed.   History of Present Illness: Patient was evaluated through phone session.  On her last visit we started Cogentin to help tremors and restlessness especially at night.  She is no longer taking Requip.  She is seen much improvement with addition of benztropine.  She is sleeping better.  She denies any restlessness at night.  She quit her job and admitted feeling more relaxed.  She is happy as she got a new job at [employer] and she is going to start on April 27.  She is not happy with pandemic coronavirus because she feels that everyone is locked down and she is very bored.  However she feels Abilify helping her mood irritability and mania.  She denies any mood swings or any paranoia.  She endorses overall her anxiety is much better and denies any panic attack in recent weeks.  She rarely takes Klonopin and she still has a refill remaining.  She denies drinking or using any illegal substances.  She wants to continue benztropine and Abilify.  She reported no side effect specially tremors shakes or any EPS.  Her appetite is okay.  Her energy level is good.  Past Psychiatric History: Reviewed H/O bipolar disorder and PTSD. H/O multiple hospitalization. Last inpatient in 2016 at [facility]. H/O mania, psychosis, suicidal attempt with overdose. Tried olanzapine, lithium, Lamictal, amantadine and Klonopin.Stopped Lamictal because she felt it was causing  restless leg.Saw Dr. Jake Michaelis since 2010.    Observations/Objective: Limited mental status examination done on the phone.  Patient is pleasant on the phone.  She describes her mood bowl.  Her speech is clear, coherent and her thought process logical and goal-directed.  There were no flight of ideas or loose association.  There were no delusions, paranoia or any active or passive suicidal thoughts or homicidal thought.  Her attention and concentration appears to be normal.  She is alert and oriented x3.  Her fund of knowledge is adequate.  Her insight and judgment is okay.  Assessment and Plan: Bipolar disorder type I.  Generalized anxiety disorder.  Rule out restless leg syndrome.  Patient doing better since the Cogentin added.  She is no longer taking Requip.  She is sleeping better.  She like to keep her current medication however wanted to transfer her care to [facility] as patient going to start new job in [place].  I discussed medication side effects and benefits.  I explained due to pandemic coronavirus it will be easier to transfer her care when situation get back to normal so she can see the new provider in person.  She agreed with the plan.  I will continue her medication.  Follow-up in 3 months and then transferred to [facility].  Recommended to call us back if she is any question or any concern.  Follow Up Instructions:    I discussed the assessment and treatment plan with the patient. The patient was provided an opportunity to ask questions and all  were answered. The patient agreed with the plan and demonstrated an understanding of the instructions.   The patient was advised to call back or seek an in-person evaluation if the symptoms worsen or if the condition fails to improve as anticipated.  I provided 20 minutes of non-face-to-face time during this encounter.   Brittany Nations, MD

## 2018-12-11 ENCOUNTER — Ambulatory Visit (HOSPITAL_COMMUNITY): Payer: 59 | Admitting: Psychiatry

## 2019-01-07 ENCOUNTER — Ambulatory Visit (INDEPENDENT_AMBULATORY_CARE_PROVIDER_SITE_OTHER): Payer: Self-pay | Admitting: Psychiatry

## 2019-01-07 ENCOUNTER — Encounter (HOSPITAL_COMMUNITY): Payer: Self-pay | Admitting: Psychiatry

## 2019-01-07 ENCOUNTER — Other Ambulatory Visit: Payer: Self-pay

## 2019-01-07 VITALS — Wt 199.0 lb

## 2019-01-07 DIAGNOSIS — F319 Bipolar disorder, unspecified: Secondary | ICD-10-CM

## 2019-01-07 DIAGNOSIS — F411 Generalized anxiety disorder: Secondary | ICD-10-CM

## 2019-01-07 MED ORDER — ARIPIPRAZOLE 15 MG PO TABS: 15.0000 mg | ORAL_TABLET | ORAL | 0 refills | Status: DC

## 2019-01-07 NOTE — Progress Notes (Signed)
Virtual Visit via Telephone Note  I connected with [name] on 01/07/19 at 10:40 AM EDT by telephone and verified that I am speaking with the correct person using two identifiers.   I discussed the limitations, risks, security and privacy concerns of performing an evaluation and management service by telephone and the availability of in person appointments. I also discussed with the patient that there may be a patient responsible charge related to this service. The patient expressed understanding and agreed to proceed.   History of Present Illness: Patient was evaluated through phone session.  She recently seen neurology for restless leg who started her on Lyrica.  She is no longer taking benztropine.  She is happy because she is able to get full-time job at [employer] and now she is eligible for benefits.  She lives with her husband who is supportive.  She denies any mania, psychosis, hallucination but feels anxious about COVID.  She takes a lot of precaution when she goes to work.  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  She denies any tremors or shakes.  She feels Abilify helping her anxiety, bipolar symptoms and depression.  She denies any recent crying spells or any ups and downs in her mood.  Her energy level is good.  She is trying to lose weight and overall she dropped 15 pounds in recent months.  She denies drinking or using any illegal substances.  Her energy level is good.   Past Psychiatric History:Reviewed H/Obipolar disorder and PTSD. H/O multiple hospitalization. [facility] H/Omania, psychosis, suicidal attemptwithoverdose. Tried olanzapine, lithium, Lamictal, amantadine and Klonopin.Stopped Lamictal because she felt it was causing restless leg.SawDr. Jake Michaelis since 2010.  Psychiatric Specialty Exam: Physical Exam  ROS  There were no vitals taken for this visit.There is no  height or weight on file to calculate BMI.  General Appearance: NA  Eye Contact:  NA  Speech:  Clear and Coherent  Volume:  Normal  Mood:  Euthymic  Affect:  NA  Thought Process:  Goal Directed  Orientation:  Full (Time, Place, and Person)  Thought Content:  WDL and Logical  Suicidal Thoughts:  No  Homicidal Thoughts:  No  Memory:  Immediate;   Good Recent;   Good Remote;   Good  Judgement:  Good  Insight:  Good  Psychomotor Activity:  NA  Concentration:  Concentration: Good and Attention Span: Good  Recall:  Good  Fund of Knowledge:  Good  Language:  Good  Akathisia:  No  Handed:  Right  AIMS (if indicated):     Assets:  Communication Skills Desire for Improvement Housing Resilience Social Support Talents/Skills Transportation  ADL's:  Intact  Cognition:  WNL  Sleep:         Assessment and Plan: Bipolar disorder type I.  Generalized anxiety disorder.  I will discontinue benztropine since patient is no longer taking it.  She is also not taking Requip and started Lyrica from neurology is helping her restless leg and anxiety.  She wants to continue Abilify since it is helping her mania.  She has not taken Klonopin in a while.  Her last prescription which was written in October 2019 and she still have few pills remaining.  She does not need a new prescription.  Reassurance given about COVID-19.  Continue Abilify 15 mg at bedtime.  Discussed medication side effects and benefits.  We will consider lowering the Abilify dose in the  future.  Recommended to call us back if she is any question or any concern.  Follow-up in 3 months.  Follow Up Instructions:    I discussed the assessment and treatment plan with the patient. The patient was provided an opportunity to ask questions and all were answered. The patient agreed with the plan and demonstrated an understanding of the instructions.   The patient was advised to call back or seek an in-person evaluation if the symptoms  worsen or if the condition fails to improve as anticipated.  I provided 20 minutes of non-face-to-face time during this encounter.   Kathlee Nations, MD

## 2020-03-03 ENCOUNTER — Ambulatory Visit
Admission: EM | Admit: 2020-03-03 | Discharge: 2020-03-03 | Disposition: A | Discharge status: Home / Self Care | Payer: 59 | Attending: Family Medicine | Admitting: Family Medicine

## 2020-03-03 ENCOUNTER — Other Ambulatory Visit: Payer: Self-pay

## 2020-03-03 DIAGNOSIS — R142 Eructation: Secondary | ICD-10-CM

## 2020-03-03 DIAGNOSIS — K2100 Gastro-esophageal reflux disease with esophagitis, without bleeding: Secondary | ICD-10-CM

## 2020-03-03 MED ORDER — ALUM & MAG HYDROXIDE-SIMETH 200-200-20 MG/5ML PO SUSP
30.0000 mL | Freq: Once | ORAL | Status: AC
  Administered 2020-03-03: 30 mL via ORAL

## 2020-03-03 MED ORDER — LIDOCAINE VISCOUS HCL 2 % MT SOLN
15.0000 mL | Freq: Once | OROMUCOSAL | Status: AC
  Administered 2020-03-03: 15 mL via ORAL

## 2020-03-03 MED ORDER — OMEPRAZOLE 20 MG PO CPDR: 20.0000 mg | DELAYED_RELEASE_CAPSULE | Freq: Every day | ORAL | 0 refills | Status: DC

## 2020-03-03 NOTE — Discharge Instructions (Addendum)
Your EKG looked wonderful today.  I am not concerned about your heart.  I do think that you are experiencing an increase in abdominal gas as well as reflux.  I would have you get some liquid Maalox and children's liquid Benadryl.  This does a wonderful job of coating the esophagus and stomach and the Benadryl ask as an anti-inflammatory throughout the GI tract.  I have sent in omeprazole for you to take once a day in the morning on empty stomach  I would have you get simethicone 80 mg, you may take this 3 times a day with meals  Drink plenty of fluids, I would also have you try some MiraLAX to get your bowels moving.  Once things start moving, the gas should move as well  Follow-up with this office or with primary care as needed  Follow-up in the ER with acute worsening symptoms

## 2020-03-03 NOTE — ED Triage Notes (Signed)
Pt reports R shoulder pain that also goes into RUQ.  Reports burning in stomach.  Started 2 days ago. Increased belching in last couple days.  This morning felt like it was acid. Has not tried any meds at home.

## 2020-03-03 NOTE — ED Provider Notes (Signed)
[place]   [id number] 03/03/20 Arrival Time: 1330  CC: ABDOMINAL PAIN  SUBJECTIVE:  [name] is a 49 y.o. female who presents with complaint of abdominal discomfort that began abruptly about an hour ago.  Reports that she is having left and right shoulder pain as well as epigastric pain and GERD.  Reports that she feels like her stomach is burning.  She reports that she took an antacid pill from a coworker earlier.  Reports that she has been belching some since then.  Has not taken any medications at home otherwise.  Reports that she is not really passing gas rectally. Denies alleviating or aggravating factors. Denies similar symptoms in the past.   Denies fever, chills, appetite changes, weight changes, nausea, vomiting,  SOB, diarrhea, constipation, hematochezia, melena, dysuria, difficulty urinating, increased frequency or urgency, flank pain, loss of bowel or bladder function, vaginal discharge, vaginal odor, vaginal bleeding, dyspareunia, pelvic pain.     No LMP recorded. Patient is perimenopausal.  ROS: As per HPI.  All other pertinent ROS negative.     Past Medical History:  Diagnosis Date  . Bipolar affective ([place])   . Restless leg syndrome   . Schizoaffective disorder [facility])    Past Surgical History:  Procedure Laterality Date  . CESAREAN SECTION     No Known Allergies No current facility-administered medications on file prior to encounter.   Current Outpatient Medications on File Prior to Encounter  Medication Sig Dispense Refill  . pregabalin (LYRICA) 50 MG capsule Take 50 mg nightly for three nights then increase to 100 mg nightly and continue that dose    . ARIPiprazole (ABILIFY) 15 MG tablet Take 1 tablet (15 mg total) by mouth every morning. 90 tablet 0  . benztropine (COGENTIN) 1 MG tablet Take 1 tablet (1 mg total) by mouth at bedtime. (Patient not taking: Reported on 01/07/2019) 90 tablet 0  . clonazePAM (KLONOPIN) 0.5 MG tablet Take 1 tablet  (0.5 mg total) by mouth at bedtime. 30 tablet 2   Social History   Socioeconomic History  . Marital status: Married    Spouse name: [name]  . Number of children: 2  . Years of education: 63  . Highest education level: Some college, no degree  Occupational History  . Not on file  Tobacco Use  . Smoking status: Current Every Day Smoker    Packs/day: 0.50    Years: 10.00    Pack years: 5.00    Types: Cigarettes  . Smokeless tobacco: Never Used  Vaping Use  . Vaping Use: Former  Substance and Sexual Activity  . Alcohol use: No  . Drug use: No  . Sexual activity: Yes  Other Topics Concern  . Not on file  Social History Narrative  . Not on file   Social Determinants of Health   Financial Resource Strain:   . Difficulty of Paying Living Expenses: Not on file  Food Insecurity:   . Worried About Charity fundraiser in the Last Year: Not on file  . Ran Out of Food in the Last Year: Not on file  Transportation Needs:   . Lack of Transportation (Medical): Not on file  . Lack of Transportation (Non-Medical): Not on file  Physical Activity:   . Days of Exercise per Week: Not on file  . Minutes of Exercise per Session: Not on file  Stress:   . Feeling of Stress : Not on file  Social Connections:   . Frequency of Communication  with Friends and Family: Not on file  . Frequency of Social Gatherings with Friends and Family: Not on file  . Attends Religious Services: Not on file  . Active Member of Clubs or Organizations: Not on file  . Attends Archivist Meetings: Not on file  . Marital Status: Not on file  Intimate Partner Violence:   . Fear of Current or Ex-Partner: Not on file  . Emotionally Abused: Not on file  . Physically Abused: Not on file  . Sexually Abused: Not on file   Family History  Problem Relation Age of Onset  . Drug abuse Maternal Aunt      OBJECTIVE:  There were no vitals filed for this visit.  General appearance: Alert; NAD HEENT: NCAT.   Oropharynx clear.  Lungs: clear to auscultation bilaterally without adventitious breath sounds Heart: regular rate and rhythm.  Radial pulses 2+ symmetrical bilaterally Abdomen: soft, non-distended; hypoactive active bowel sounds; tender epigastrium with light palpation, nontender otherwise; nontender at McBurney's point; negative Murphy's sign; negative rebound; no guarding Back: no CVA tenderness Extremities: no edema; symmetrical with no gross deformities Skin: warm and dry Neurologic: normal gait Psychological: alert and cooperative; normal mood and affect  LABS: No results found for this or any previous visit (from the past 24 hour(s)).  DIAGNOSTIC STUDIES: No results found.   ASSESSMENT & PLAN:  1. Gastroesophageal reflux disease with esophagitis without hemorrhage   2. Belching     Meds ordered this encounter  Medications  . AND Linked Order Group   . alum & mag hydroxide-simeth (MAALOX/MYLANTA) 200-200-20 MG/5ML suspension 30 mL   . lidocaine (XYLOCAINE) 2 % viscous mouth solution 15 mL  . omeprazole (PRILOSEC) 20 MG capsule    Sig: Take 1 capsule (20 mg total) by mouth daily.    Dispense:  30 capsule    Refill:  0    Order Specific Question:   Supervising Provider    Answer:   Chase Picket [[id number]]     GI cocktail given in office Prescribed omeprazole May try simethicone May try MiraLAX as well Drink warm liquids Physical exercise will also help get gas is moving in the abdomen  If you experience new or worsening symptoms return or go to ER such as fever, chills, nausea, vomiting, diarrhea, bloody or dark tarry stools, constipation, urinary symptoms, worsening abdominal discomfort, symptoms that do not improve with medications, inability to keep fluids down.  Reviewed expectations re: course of current medical issues. Questions answered. Outlined signs and symptoms indicating need for more acute intervention. Patient verbalized understanding. After Visit  Summary given.   Faustino Congress, NP 03/03/20 1428

## 2020-03-27 ENCOUNTER — Other Ambulatory Visit: Payer: Self-pay

## 2020-03-27 ENCOUNTER — Encounter: Payer: Self-pay | Admitting: Family

## 2020-03-27 ENCOUNTER — Telehealth: Payer: Self-pay | Admitting: Family

## 2020-03-27 ENCOUNTER — Ambulatory Visit: Payer: 59 | Admitting: Family

## 2020-03-27 VITALS — BP 116/70 | HR 83 | Temp 98.3°F | Ht 62.25 in | Wt 207.6 lb

## 2020-03-27 DIAGNOSIS — I509 Heart failure, unspecified: Secondary | ICD-10-CM | POA: Diagnosis not present

## 2020-03-27 DIAGNOSIS — F319 Bipolar disorder, unspecified: Secondary | ICD-10-CM | POA: Diagnosis not present

## 2020-03-27 DIAGNOSIS — F259 Schizoaffective disorder, unspecified: Secondary | ICD-10-CM | POA: Diagnosis not present

## 2020-03-27 DIAGNOSIS — Z1159 Encounter for screening for other viral diseases: Secondary | ICD-10-CM | POA: Diagnosis not present

## 2020-03-27 DIAGNOSIS — Z0000 Encounter for general adult medical examination without abnormal findings: Secondary | ICD-10-CM

## 2020-03-27 DIAGNOSIS — G2581 Restless legs syndrome: Secondary | ICD-10-CM | POA: Diagnosis not present

## 2020-03-27 DIAGNOSIS — Z23 Encounter for immunization: Secondary | ICD-10-CM

## 2020-03-27 LAB — COMPREHENSIVE METABOLIC PANEL
ALT: 27 U/L (ref 0–35)
AST: 17 U/L (ref 0–37)
Albumin: 4.3 g/dL (ref 3.5–5.2)
Alkaline Phosphatase: 85 U/L (ref 39–117)
BUN: 22 mg/dL (ref 6–23)
CO2: 26 mEq/L (ref 19–32)
Calcium: 9.4 mg/dL (ref 8.4–10.5)
Chloride: 106 mEq/L (ref 96–112)
Creatinine, Ser: 0.8 mg/dL (ref 0.40–1.20)
GFR: 86.38 mL/min (ref >60.00)
Glucose, Bld: 72 mg/dL (ref 70–99)
Potassium: 4.5 mEq/L (ref 3.5–5.1)
Sodium: 139 mEq/L (ref 135–145)
Total Bilirubin: 0.4 mg/dL (ref 0.2–1.2)
Total Protein: 6.9 g/dL (ref 6.0–8.3)

## 2020-03-27 LAB — TSH: TSH: 3.48 u[IU]/mL (ref 0.35–4.50)

## 2020-03-27 LAB — CBC WITH DIFFERENTIAL/PLATELET
Basophils Absolute: 0.1 10*3/uL (ref 0.0–0.1)
Basophils Relative: 1 % (ref 0.0–3.0)
Eosinophils Absolute: 0.2 10*3/uL (ref 0.0–0.7)
Eosinophils Relative: 2.4 % (ref 0.0–5.0)
HCT: 42.2 % (ref 36.0–46.0)
Hemoglobin: 14.1 g/dL (ref 12.0–15.0)
Lymphocytes Relative: 38.8 % (ref 12.0–46.0)
Lymphs Abs: 3.6 10*3/uL (ref 0.7–4.0)
MCHC: 33.4 g/dL (ref 30.0–36.0)
MCV: 88.6 fl (ref 78.0–100.0)
Monocytes Absolute: 0.8 10*3/uL (ref 0.1–1.0)
Monocytes Relative: 8.8 % (ref 3.0–12.0)
Neutro Abs: 4.6 10*3/uL (ref 1.4–7.7)
Neutrophils Relative %: 49 % (ref 43.0–77.0)
Platelets: 349 10*3/uL (ref 150.0–400.0)
RBC: 4.76 Mil/uL (ref 3.87–5.11)
RDW: 14.2 % (ref 11.5–15.5)
WBC: 9.3 10*3/uL (ref 4.0–10.5)

## 2020-03-27 LAB — IBC + FERRITIN
Ferritin: 26.6 ng/mL (ref 10.0–291.0)
Iron: 93 ug/dL (ref 42–145)
Saturation Ratios: 22.7 % (ref 20.0–50.0)
Transferrin: 292 mg/dL (ref 212.0–360.0)

## 2020-03-27 LAB — LIPID PANEL
Cholesterol: 188 mg/dL (ref 0–200)
HDL: 49 mg/dL (ref >39.00)
LDL Cholesterol: 105 mg/dL — ABNORMAL HIGH (ref 0–99)
NonHDL: 138.68
Total CHOL/HDL Ratio: 4
Triglycerides: 170 mg/dL — ABNORMAL HIGH (ref 0.0–149.0)
VLDL: 34 mg/dL (ref 0.0–40.0)

## 2020-03-27 LAB — B12 AND FOLATE PANEL
Folate: 9.7 ng/mL (ref >5.9)
Vitamin B-12: 198 pg/mL — ABNORMAL LOW (ref 211–911)

## 2020-03-27 LAB — VITAMIN D 25 HYDROXY (VIT D DEFICIENCY, FRACTURES): VITD: 20 ng/mL — ABNORMAL LOW (ref 30.00–100.00)

## 2020-03-27 LAB — HEMOGLOBIN A1C: Hgb A1c MFr Bld: 6.2 % (ref 4.6–6.5)

## 2020-03-27 NOTE — Assessment & Plan Note (Deleted)
Appears either resolved or inaccurate diagnosis based on patient's description today. Patient denies manic symptoms, depression, suicidal thoughts today.  Reviewed psychiatrist note, Dr Marguerite Olea note from 01/2019 in regards to h/o bipolar, PTSD, multiple hospitalizations, suicide attempt, overdose. I have sent him a message for advice in regards to his level of concern of not being on ability for manic symptoms. Will closely follow.

## 2020-03-27 NOTE — Patient Instructions (Signed)
Nice to meet you!

## 2020-03-27 NOTE — Progress Notes (Signed)
Subjective:    Patient ID: [name], female    DOB: Jan 03, 1971, 49 y.o.   MRN: [id number]  CC: [name] is a 49 y.o. female who presents today to establish care.    HPI: Had been seeing [facility]. She is ready to focus more on health. She is working out and has lost 10 lbs on her own.  Most bothersome is h/o RLS.   Follows with [facility] neurology for RLS whom recently started lyrica 50mg  qpm 12/2019.  CP pain has resolved since being seen UC x 3 weeks ago. Told she had 'GERD' and taking OTC medication and doesn't recall name. No further CP. Denies exertional chest pain or pressure, numbness or tingling radiating to left arm or jaw, palpitations, dizziness, frequent headaches, changes in vision, or shortness of breath.   Using trazodone 100mg  prn to help staying asleep.   Years ago had [employer] was in unhealthy in which was bullied. At that time, she told had bipolar, schizophrenia. She doesn't feel this was accurate and no longer on abilify, klonopin, congentin. No si/hi.  No longer following with counselor. Happy working for [employer].   Denies a period of having more energy than usual, didn't require sleep, spending more money than usual and got into trouble, a time when so hyper got into trouble, more irritated that you started arguments.  Denies that these acts caused trouble at work, financially, or with family or personal relationships.   H/o CHF during pregnancy 20+ years ago. No leg swelling, orthopnea.         HISTORY:  Past Medical History:  Diagnosis Date  . Akathisia 12/17/2014  . Bipolar affective ([place])   . Restless leg syndrome   . Schizoaffective disorder ([place])   . Schizoaffective disorder [facility])    Past Surgical History:  Procedure Laterality Date  . CESAREAN SECTION     Family History  Problem Relation Age of Onset  . Drug abuse Maternal Aunt     Allergies: Patient has no known allergies. Current Outpatient Medications on File  Prior to Visit  Medication Sig Dispense Refill  . pregabalin (LYRICA) 50 MG capsule Take 50 mg by mouth at bedtime as needed.     . traZODone (DESYREL) 100 MG tablet Take 1 tablet by mouth at bedtime as needed.     No current facility-administered medications on file prior to visit.    Social History   Tobacco Use  . Smoking status: Current Every Day Smoker    Packs/day: 0.50    Years: 10.00    Pack years: 5.00    Types: Cigarettes  . Smokeless tobacco: Never Used  Vaping Use  . Vaping Use: Former  Substance Use Topics  . Alcohol use: No  . Drug use: No    Review of Systems  Constitutional: Negative for chills and fever.  Respiratory: Negative for cough.   Cardiovascular: Negative for chest pain and palpitations.  Gastrointestinal: Negative for nausea and vomiting.      Objective:    BP 116/70 (BP Location: Left Arm, Patient Position: Sitting, Cuff Size: Large)   Pulse 83   Temp 98.3 F (36.8 C)   Ht 5' 2.25" (1.581 m)   Wt 207 lb 9.6 oz (94.2 kg)   SpO2 98%   BMI 37.67 kg/m  BP Readings from Last 3 Encounters:  03/27/20 116/70  12/18/14 133/83   Wt Readings from Last 3 Encounters:  03/27/20 207 lb 9.6 oz (94.2 kg)  12/17/14 190 lb (86.2 kg)    Physical Exam Vitals reviewed.  Constitutional:      Appearance: She is well-developed.  Eyes:     Conjunctiva/sclera: Conjunctivae normal.  Cardiovascular:     Rate and Rhythm: Normal rate and regular rhythm.     Pulses: Normal pulses.     Heart sounds: Normal heart sounds.  Pulmonary:     Effort: Pulmonary effort is normal.     Breath sounds: Normal breath sounds. No wheezing, rhonchi or rales.  Skin:    General: Skin is warm and dry.  Neurological:     Mental Status: She is alert.  Psychiatric:        Speech: Speech normal.        Behavior: Behavior normal.        Thought Content: Thought content normal.        Assessment & Plan:   Problem List Items Addressed This Visit      Cardiovascular and  Mediastinum   Congestive heart failure ([place])    Asymptomatic. We jointly agreed to pursue structural evaluation based on history. Pending echo      Relevant Orders   ECHOCARDIOGRAM COMPLETE     Other   Bipolar affective ([place])    Currently asymptomatic.  Patient denies manic symptoms, depression, suicidal thoughts today.  Reviewed psychiatrist note, Dr Marguerite Olea note from 01/2019 in regards to h/o bipolar, PTSD, multiple hospitalizations, suicide attempt, overdose. Consulted with Dr Adele Schilder whom advised that bipolar is chronic and shared concern of recurrence without medication. After I could reach patient by phone to discuss, I have sent her mychart advising to re-establish with Dr Adele Schilder for on going surveillance of chronic disease. He was also able to confirm that patient did not have diagnosis of schizophrenia.       Encounter for medical examination to establish care - Primary    Reviewed past medical history. Patient will return for CPE. Screening labs ordered today.      Relevant Orders   TSH (Completed)   CBC with Differential/Platelet (Completed)   Comprehensive metabolic panel (Completed)   Hemoglobin A1c (Completed)   Lipid panel (Completed)   VITAMIN D 25 Hydroxy (Vit-D Deficiency, Fractures) (Completed)   B12 and Folate Panel (Completed)   IBC + Ferritin (Completed)   Hepatitis C antibody (Completed)   Restless legs    Chronic, stable. Continue lyrica. Pending ferritin, b12 labs.      Relevant Orders   CBC with Differential/Platelet (Completed)   B12 and Folate Panel (Completed)   IBC + Ferritin (Completed)    Other Visit Diagnoses    Encounter for hepatitis C screening test for low risk patient       Relevant Orders   Hepatitis C antibody (Completed)   Schizoaffective disorder, unspecified type ([place])       Need for Tdap vaccination       Relevant Orders   Tdap vaccine greater than or equal to 7yo IM (Completed)       I have discontinued [place] P. Down's  clonazePAM, benztropine, ARIPiprazole, and omeprazole. I am also having her maintain her pregabalin and traZODone.   No orders of the defined types were placed in this encounter.   Return precautions given.   Risks, benefits, and alternatives of the medications and treatment plan prescribed today were discussed, and patient expressed understanding.   Education regarding symptom management and diagnosis given to patient on AVS.  Continue to follow with Burnard Hawthorne, FNP for routine health  maintenance.   [name] and I agreed with plan.   Mable Paris, FNP

## 2020-03-27 NOTE — Assessment & Plan Note (Signed)
Chronic, stable. Continue lyrica. Pending ferritin, b12 labs.

## 2020-03-27 NOTE — Telephone Encounter (Signed)
-----   Message from Kathlee Nations, MD sent at 03/27/2020 10:20 AM EDT ----- [name] Thanks for sending me a message.  I do believe she should discuss with [place] if she is not happy or convinced with the diagnosis.  She was diagnosed with bipolar disorder and not schizophrenia.  Given the history and hospitalization I believe she will be at risk if she does not take any medicine as these illness are chronic.  However I am happy that she is doing well but she will require close monitoring.  If you are following and monitoring her for her psychiatric symptoms then she does not need to see a psychiatrist.    Let me know if I can be helpful.Dr. Adele Schilder  ----- Message ----- From: Burnard Hawthorne, FNP Sent: 03/27/2020   9:25 AM EDT To: Kathlee Nations, MD  Dr Adele Schilder,  [facility] you are well.I know you havent seen this patient since 01/2019 and I reviewed your note in detail.  [name] has established care with me today and is no longer on abilify. She states that diagnosis of bipolar and schizophrenia were inaccurate and related to unhealthy job with [employer]. She remains happy with [facility] and denies depression, suicidal ideation, manic symptoms today.   Im reaching out as her history of hospitalization, suicide attempt certainly make me concerned.  Do you feel she should have a follow up with you?   First time meeting her and didn't know you felt you knew her better.   Thanks for any advice here.  [name]

## 2020-03-27 NOTE — Assessment & Plan Note (Signed)
Reviewed past medical history. Patient will return for CPE. Screening labs ordered today.

## 2020-03-27 NOTE — Telephone Encounter (Signed)
FYI [name]  Left message on mobile to call us back  Let me know if she calls back and I can speak with her

## 2020-03-27 NOTE — Assessment & Plan Note (Signed)
Asymptomatic. We jointly agreed to pursue structural evaluation based on history. Pending echo

## 2020-03-30 ENCOUNTER — Other Ambulatory Visit: Payer: Self-pay | Admitting: Family

## 2020-03-30 DIAGNOSIS — E538 Deficiency of other specified B group vitamins: Secondary | ICD-10-CM

## 2020-03-30 DIAGNOSIS — F319 Bipolar disorder, unspecified: Secondary | ICD-10-CM | POA: Insufficient documentation

## 2020-03-30 LAB — HEPATITIS C ANTIBODY
Hepatitis C Ab: NONREACTIVE
SIGNAL TO CUT-OFF: 0.01 (ref <1.00)

## 2020-03-30 NOTE — Assessment & Plan Note (Signed)
Currently asymptomatic.  Patient denies manic symptoms, depression, suicidal thoughts today.  Reviewed psychiatrist note, Dr Marguerite Olea note from 01/2019 in regards to h/o bipolar, PTSD, multiple hospitalizations, suicide attempt, overdose. Consulted with Dr Adele Schilder whom advised that bipolar is chronic and shared concern of recurrence without medication. After I could reach patient by phone to discuss, I have sent her mychart advising to re-establish with Dr Adele Schilder for on going surveillance of chronic disease. He was also able to confirm that patient did not have diagnosis of schizophrenia.

## 2020-03-31 ENCOUNTER — Telehealth: Payer: Self-pay

## 2020-03-31 ENCOUNTER — Encounter: Payer: Self-pay | Admitting: Family

## 2020-03-31 ENCOUNTER — Other Ambulatory Visit: Payer: Self-pay

## 2020-03-31 DIAGNOSIS — E538 Deficiency of other specified B group vitamins: Secondary | ICD-10-CM

## 2020-03-31 DIAGNOSIS — G2581 Restless legs syndrome: Secondary | ICD-10-CM

## 2020-03-31 NOTE — Telephone Encounter (Signed)
LMTCB to schedule patient for labs in 1-2 weeks.

## 2020-04-01 ENCOUNTER — Other Ambulatory Visit: Payer: Self-pay | Admitting: Family

## 2020-04-01 DIAGNOSIS — G2581 Restless legs syndrome: Secondary | ICD-10-CM

## 2020-04-02 ENCOUNTER — Encounter (HOSPITAL_COMMUNITY): Payer: Self-pay | Admitting: Psychiatry

## 2020-04-08 ENCOUNTER — Other Ambulatory Visit: Payer: Self-pay

## 2020-04-08 ENCOUNTER — Other Ambulatory Visit
Admission: RE | Admit: 2020-04-08 | Discharge: 2020-04-08 | Disposition: A | Discharge status: Home / Self Care | Payer: 59 | Attending: Family | Admitting: Family

## 2020-04-08 DIAGNOSIS — E538 Deficiency of other specified B group vitamins: Secondary | ICD-10-CM | POA: Diagnosis not present

## 2020-04-08 DIAGNOSIS — G2581 Restless legs syndrome: Secondary | ICD-10-CM | POA: Diagnosis not present

## 2020-04-09 LAB — INTRINSIC FACTOR ANTIBODIES: Intrinsic Factor: 1 AU/mL (ref 0.0–1.1)

## 2020-04-09 LAB — ANTI-PARIETAL ANTIBODY: Parietal Cell Antibody-IgG: 8.7 Units (ref 0.0–20.0)

## 2020-04-11 LAB — METHYLMALONIC ACID, SERUM: Methylmalonic Acid, Quantitative: 102 nmol/L (ref 0–378)

## 2020-04-17 ENCOUNTER — Telehealth: Payer: Self-pay | Admitting: Family

## 2020-04-17 NOTE — Telephone Encounter (Signed)
lft vm for pt to call ofc to sch echo.

## 2020-05-06 ENCOUNTER — Telehealth: Payer: Self-pay | Admitting: Family

## 2020-05-06 NOTE — Telephone Encounter (Signed)
lft vm for pt to call ofc to get echo scheduled.

## 2020-05-08 ENCOUNTER — Telehealth: Payer: Self-pay

## 2020-05-08 NOTE — Telephone Encounter (Signed)
-----   Message from Ashley Jacobs sent at 05/08/2020 10:26 AM EST ----- Regarding: echo Good morning!  I spoke with pt to get pt scheduled for a echo. Pt asked why echo was needed and stated she had echo at urgent care. Pt also stated she would like to wait until January. Please advise and Thank you!

## 2020-05-08 NOTE — Telephone Encounter (Signed)
Happy to call patient if needed. I have not seen mychart message from patient.

## 2020-05-11 NOTE — Telephone Encounter (Signed)
Call pt We discussed echo during visit as test to eval for heart failure which per patient she has a h/o .  EKG doesn't eval for this Let me know if she wants to pursue

## 2020-05-12 NOTE — Telephone Encounter (Signed)
Yw! 

## 2020-05-12 NOTE — Telephone Encounter (Signed)
Patient did not realize the difference in an EKG & ECHO. She would like to pursue ECHO & she asked if any way possible we could get scheduled 12/17? She has another appointment that day & doesn't want to take multiple days off from work.

## 2020-05-12 NOTE — Telephone Encounter (Signed)
Good morning!  Pt is scheduled on 12/17 at 10 am. Pt was notified.

## 2020-05-12 NOTE — Telephone Encounter (Signed)
Perfect; TY

## 2020-05-22 ENCOUNTER — Ambulatory Visit
Admission: RE | Admit: 2020-05-22 | Discharge: 2020-05-22 | Disposition: A | Discharge status: Home / Self Care | Payer: 59 | Source: Ambulatory Visit | Attending: Family | Admitting: Family

## 2020-05-22 ENCOUNTER — Other Ambulatory Visit: Payer: Self-pay

## 2020-05-22 DIAGNOSIS — I351 Nonrheumatic aortic (valve) insufficiency: Secondary | ICD-10-CM | POA: Diagnosis not present

## 2020-05-22 DIAGNOSIS — I509 Heart failure, unspecified: Secondary | ICD-10-CM | POA: Diagnosis not present

## 2020-05-22 LAB — ECHOCARDIOGRAM COMPLETE
AR max vel: 1.83 cm2
AV Area VTI: 2.08 cm2
AV Area mean vel: 1.75 cm2
AV Mean grad: 3.3 mmHg
AV Peak grad: 5.4 mmHg
Ao pk vel: 1.16 m/s
Area-P 1/2: 5.31 cm2
S' Lateral: 2.58 cm

## 2020-05-22 NOTE — Progress Notes (Signed)
*  PRELIMINARY RESULTS* Echocardiogram 2D Echocardiogram has been performed.  Sherrie Sport 05/22/2020, 10:45 AM

## 2020-05-25 ENCOUNTER — Other Ambulatory Visit: Payer: Self-pay | Admitting: Family

## 2020-05-25 DIAGNOSIS — I509 Heart failure, unspecified: Secondary | ICD-10-CM

## 2020-06-29 ENCOUNTER — Encounter: Payer: 59 | Admitting: Family

## 2020-06-29 ENCOUNTER — Ambulatory Visit: Payer: 59 | Admitting: Cardiology

## 2020-07-02 ENCOUNTER — Encounter
Admission: EM | Disposition: A | Discharge status: Home / Self Care | Payer: Self-pay | Source: Home / Self Care | Attending: Student in an Organized Health Care Education/Training Program

## 2020-07-02 ENCOUNTER — Other Ambulatory Visit: Payer: Self-pay

## 2020-07-02 ENCOUNTER — Observation Stay: Payer: 59 | Admitting: Registered Nurse

## 2020-07-02 ENCOUNTER — Emergency Department: Payer: 59

## 2020-07-02 ENCOUNTER — Observation Stay (HOSPITAL_COMMUNITY)
Admission: EM | Admit: 2020-07-02 | Discharge: 2020-07-02 | Disposition: A | Discharge status: Home / Self Care | Payer: 59 | Source: Home / Self Care | Attending: Student in an Organized Health Care Education/Training Program | Admitting: Student in an Organized Health Care Education/Training Program

## 2020-07-02 ENCOUNTER — Encounter: Payer: Self-pay | Admitting: Emergency Medicine

## 2020-07-02 DIAGNOSIS — O26891 Other specified pregnancy related conditions, first trimester: Secondary | ICD-10-CM | POA: Diagnosis not present

## 2020-07-02 DIAGNOSIS — R1031 Right lower quadrant pain: Secondary | ICD-10-CM

## 2020-07-02 DIAGNOSIS — J9601 Acute respiratory failure with hypoxia: Secondary | ICD-10-CM | POA: Diagnosis not present

## 2020-07-02 DIAGNOSIS — J189 Pneumonia, unspecified organism: Secondary | ICD-10-CM | POA: Diagnosis not present

## 2020-07-02 DIAGNOSIS — K353 Acute appendicitis with localized peritonitis, without perforation or gangrene: Secondary | ICD-10-CM | POA: Diagnosis not present

## 2020-07-02 DIAGNOSIS — R079 Chest pain, unspecified: Secondary | ICD-10-CM | POA: Diagnosis not present

## 2020-07-02 DIAGNOSIS — F17210 Nicotine dependence, cigarettes, uncomplicated: Secondary | ICD-10-CM | POA: Insufficient documentation

## 2020-07-02 DIAGNOSIS — M25511 Pain in right shoulder: Secondary | ICD-10-CM | POA: Diagnosis not present

## 2020-07-02 DIAGNOSIS — Z3A00 Weeks of gestation of pregnancy not specified: Secondary | ICD-10-CM | POA: Diagnosis not present

## 2020-07-02 DIAGNOSIS — K3580 Unspecified acute appendicitis: Secondary | ICD-10-CM | POA: Diagnosis present

## 2020-07-02 DIAGNOSIS — R1011 Right upper quadrant pain: Secondary | ICD-10-CM | POA: Diagnosis not present

## 2020-07-02 DIAGNOSIS — R0602 Shortness of breath: Secondary | ICD-10-CM | POA: Diagnosis not present

## 2020-07-02 DIAGNOSIS — R911 Solitary pulmonary nodule: Secondary | ICD-10-CM | POA: Diagnosis not present

## 2020-07-02 DIAGNOSIS — Z20822 Contact with and (suspected) exposure to covid-19: Secondary | ICD-10-CM | POA: Insufficient documentation

## 2020-07-02 DIAGNOSIS — Z791 Long term (current) use of non-steroidal anti-inflammatories (NSAID): Secondary | ICD-10-CM | POA: Diagnosis not present

## 2020-07-02 DIAGNOSIS — J9811 Atelectasis: Secondary | ICD-10-CM | POA: Diagnosis not present

## 2020-07-02 DIAGNOSIS — K3589 Other acute appendicitis without perforation or gangrene: Secondary | ICD-10-CM | POA: Diagnosis not present

## 2020-07-02 DIAGNOSIS — A419 Sepsis, unspecified organism: Secondary | ICD-10-CM | POA: Diagnosis not present

## 2020-07-02 DIAGNOSIS — O3411 Maternal care for benign tumor of corpus uteri, first trimester: Secondary | ICD-10-CM | POA: Diagnosis not present

## 2020-07-02 DIAGNOSIS — R109 Unspecified abdominal pain: Secondary | ICD-10-CM | POA: Diagnosis not present

## 2020-07-02 DIAGNOSIS — K8020 Calculus of gallbladder without cholecystitis without obstruction: Secondary | ICD-10-CM | POA: Diagnosis not present

## 2020-07-02 DIAGNOSIS — R0600 Dyspnea, unspecified: Secondary | ICD-10-CM | POA: Diagnosis not present

## 2020-07-02 DIAGNOSIS — G8918 Other acute postprocedural pain: Secondary | ICD-10-CM | POA: Diagnosis not present

## 2020-07-02 HISTORY — PX: LAPAROSCOPIC APPENDECTOMY: SHX408

## 2020-07-02 LAB — CBC WITH DIFFERENTIAL/PLATELET
Abs Immature Granulocytes: 0.03 K/uL (ref 0.00–0.07)
Basophils Absolute: 0.1 K/uL (ref 0.0–0.1)
Basophils Relative: 1 %
Eosinophils Absolute: 0.3 K/uL (ref 0.0–0.5)
Eosinophils Relative: 4 %
HCT: 42.3 % (ref 36.0–46.0)
Hemoglobin: 14 g/dL (ref 12.0–15.0)
Immature Granulocytes: 0 %
Lymphocytes Relative: 38 %
Lymphs Abs: 3.4 K/uL (ref 0.7–4.0)
MCH: 29.2 pg (ref 26.0–34.0)
MCHC: 33.1 g/dL (ref 30.0–36.0)
MCV: 88.3 fL (ref 80.0–100.0)
Monocytes Absolute: 0.7 K/uL (ref 0.1–1.0)
Monocytes Relative: 8 %
Neutro Abs: 4.4 K/uL (ref 1.7–7.7)
Neutrophils Relative %: 49 %
Platelets: 358 K/uL (ref 150–400)
RBC: 4.79 MIL/uL (ref 3.87–5.11)
RDW: 13.9 % (ref 11.5–15.5)
WBC: 8.9 K/uL (ref 4.0–10.5)
nRBC: 0 % (ref 0.0–0.2)

## 2020-07-02 LAB — COMPREHENSIVE METABOLIC PANEL WITH GFR
ALT: 32 U/L (ref 0–44)
AST: 23 U/L (ref 15–41)
Albumin: 4.3 g/dL (ref 3.5–5.0)
Alkaline Phosphatase: 83 U/L (ref 38–126)
Anion gap: 11 (ref 5–15)
BUN: 18 mg/dL (ref 6–20)
CO2: 21 mmol/L — ABNORMAL LOW (ref 22–32)
Calcium: 9.3 mg/dL (ref 8.9–10.3)
Chloride: 106 mmol/L (ref 98–111)
Creatinine, Ser: 0.69 mg/dL (ref 0.44–1.00)
GFR, Estimated: >60 mL/min
Glucose, Bld: 124 mg/dL — ABNORMAL HIGH (ref 70–99)
Potassium: 4.1 mmol/L (ref 3.5–5.1)
Sodium: 138 mmol/L (ref 135–145)
Total Bilirubin: 0.7 mg/dL (ref 0.3–1.2)
Total Protein: 7.7 g/dL (ref 6.5–8.1)

## 2020-07-02 LAB — LIPASE, BLOOD: Lipase: 26 U/L (ref 11–51)

## 2020-07-02 LAB — URINALYSIS, COMPLETE (UACMP) WITH MICROSCOPIC
Bilirubin Urine: NEGATIVE
Glucose, UA: NEGATIVE mg/dL
Hgb urine dipstick: NEGATIVE
Ketones, ur: NEGATIVE mg/dL
Leukocytes,Ua: NEGATIVE
Nitrite: NEGATIVE
Protein, ur: NEGATIVE mg/dL
Specific Gravity, Urine: 1.023 (ref 1.005–1.030)
pH: 5 (ref 5.0–8.0)

## 2020-07-02 LAB — HCG, QUANTITATIVE, PREGNANCY: hCG, Beta Chain, Quant, S: 7 m[IU]/mL — ABNORMAL HIGH (ref <5)

## 2020-07-02 LAB — SARS CORONAVIRUS 2 BY RT PCR (HOSPITAL ORDER, PERFORMED IN ~~LOC~~ HOSPITAL LAB): SARS Coronavirus 2: NEGATIVE

## 2020-07-02 SURGERY — APPENDECTOMY, LAPAROSCOPIC
Anesthesia: General

## 2020-07-02 MED ORDER — KETOROLAC TROMETHAMINE 30 MG/ML IJ SOLN
INTRAMUSCULAR | Status: DC | PRN
  Administered 2020-07-02: 30 mg via INTRAVENOUS

## 2020-07-02 MED ORDER — MIDAZOLAM HCL 2 MG/2ML IJ SOLN
INTRAMUSCULAR | Status: AC
  Filled 2020-07-02: qty 2

## 2020-07-02 MED ORDER — HEMOSTATIC AGENTS (NO CHARGE) OPTIME
TOPICAL | Status: DC | PRN
  Administered 2020-07-02: 1 via TOPICAL

## 2020-07-02 MED ORDER — MIDAZOLAM HCL 2 MG/2ML IJ SOLN
INTRAMUSCULAR | Status: DC | PRN
  Administered 2020-07-02: 2 mg via INTRAVENOUS

## 2020-07-02 MED ORDER — DEXAMETHASONE SODIUM PHOSPHATE 10 MG/ML IJ SOLN
INTRAMUSCULAR | Status: DC | PRN
  Administered 2020-07-02: 10 mg via INTRAVENOUS

## 2020-07-02 MED ORDER — SUGAMMADEX SODIUM 200 MG/2ML IV SOLN
INTRAVENOUS | Status: DC | PRN
  Administered 2020-07-02: 200 mg via INTRAVENOUS

## 2020-07-02 MED ORDER — FENTANYL CITRATE (PF) 100 MCG/2ML IJ SOLN
25.0000 ug | INTRAMUSCULAR | Status: DC | PRN
Start: 2020-07-02 — End: 2020-07-02

## 2020-07-02 MED ORDER — LIDOCAINE HCL (PF) 1 % IJ SOLN
INTRAMUSCULAR | Status: AC
  Filled 2020-07-02: qty 30

## 2020-07-02 MED ORDER — ACETAMINOPHEN 10 MG/ML IV SOLN
INTRAVENOUS | Status: DC | PRN
  Administered 2020-07-02: 1000 mg via INTRAVENOUS

## 2020-07-02 MED ORDER — OXYCODONE HCL 5 MG PO TABS: 5.0000 mg | ORAL_TABLET | Freq: Four times a day (QID) | ORAL | 0 refills | Status: DC | PRN

## 2020-07-02 MED ORDER — LIDOCAINE HCL (CARDIAC) PF 100 MG/5ML IV SOSY
PREFILLED_SYRINGE | INTRAVENOUS | Status: DC | PRN
  Administered 2020-07-02: 100 mg via INTRAVENOUS

## 2020-07-02 MED ORDER — ACETAMINOPHEN 500 MG PO TABS: 1000.0000 mg | ORAL_TABLET | Freq: Four times a day (QID) | ORAL | Status: DC

## 2020-07-02 MED ORDER — ROCURONIUM BROMIDE 100 MG/10ML IV SOLN
INTRAVENOUS | Status: DC | PRN
  Administered 2020-07-02: 35 mg via INTRAVENOUS
  Administered 2020-07-02: 5 mg via INTRAVENOUS

## 2020-07-02 MED ORDER — OXYCODONE HCL 5 MG PO TABS
ORAL_TABLET | ORAL | Status: AC
  Administered 2020-07-02: 5 mg via ORAL
  Filled 2020-07-02: qty 1

## 2020-07-02 MED ORDER — SODIUM CHLORIDE 0.9 % IV SOLN: INTRAVENOUS | Status: DC

## 2020-07-02 MED ORDER — ONDANSETRON HCL 4 MG/2ML IJ SOLN
INTRAMUSCULAR | Status: DC | PRN
  Administered 2020-07-02: 4 mg via INTRAVENOUS

## 2020-07-02 MED ORDER — IBUPROFEN 800 MG PO TABS: 800.0000 mg | ORAL_TABLET | Freq: Three times a day (TID) | ORAL | 0 refills | Status: DC | PRN

## 2020-07-02 MED ORDER — LIDOCAINE-EPINEPHRINE 1 %-1:100000 IJ SOLN
INTRAMUSCULAR | Status: DC | PRN
  Administered 2020-07-02: 18 mL via INTRAMUSCULAR

## 2020-07-02 MED ORDER — ONDANSETRON 4 MG PO TBDP: 4.0000 mg | ORAL_TABLET | Freq: Four times a day (QID) | ORAL | 0 refills | Status: DC | PRN

## 2020-07-02 MED ORDER — ONDANSETRON 4 MG PO TBDP: 4.0000 mg | ORAL_TABLET | Freq: Four times a day (QID) | ORAL | Status: DC | PRN

## 2020-07-02 MED ORDER — FENTANYL CITRATE (PF) 100 MCG/2ML IJ SOLN
INTRAMUSCULAR | Status: DC | PRN
  Administered 2020-07-02: 100 ug via INTRAVENOUS

## 2020-07-02 MED ORDER — HYDROMORPHONE HCL 1 MG/ML IJ SOLN
0.5000 mg | INTRAMUSCULAR | Status: DC | PRN
  Administered 2020-07-02: 0.5 mg via INTRAVENOUS
  Filled 2020-07-02: qty 1

## 2020-07-02 MED ORDER — MORPHINE SULFATE (PF) 4 MG/ML IV SOLN
4.0000 mg | INTRAVENOUS | Status: DC | PRN
  Administered 2020-07-02: 4 mg via INTRAVENOUS
  Filled 2020-07-02: qty 1

## 2020-07-02 MED ORDER — PROMETHAZINE HCL 25 MG/ML IJ SOLN: 6.2500 mg | INTRAMUSCULAR | Status: DC | PRN

## 2020-07-02 MED ORDER — DEXMEDETOMIDINE (PRECEDEX) IN NS 20 MCG/5ML (4 MCG/ML) IV SYRINGE
PREFILLED_SYRINGE | INTRAVENOUS | Status: DC | PRN
  Administered 2020-07-02: 4 ug via INTRAVENOUS
  Administered 2020-07-02 (×2): 8 ug via INTRAVENOUS

## 2020-07-02 MED ORDER — KETOROLAC TROMETHAMINE 30 MG/ML IJ SOLN
INTRAMUSCULAR | Status: AC
  Filled 2020-07-02: qty 1

## 2020-07-02 MED ORDER — SUCCINYLCHOLINE CHLORIDE 20 MG/ML IJ SOLN
INTRAMUSCULAR | Status: DC | PRN
  Administered 2020-07-02: 120 mg via INTRAVENOUS

## 2020-07-02 MED ORDER — SODIUM CHLORIDE 0.9 % IV BOLUS
1000.0000 mL | Freq: Once | INTRAVENOUS | Status: AC
  Administered 2020-07-02: 1000 mL via INTRAVENOUS

## 2020-07-02 MED ORDER — ONDANSETRON HCL 4 MG/2ML IJ SOLN
4.0000 mg | Freq: Four times a day (QID) | INTRAMUSCULAR | Status: DC | PRN
  Administered 2020-07-02: 4 mg via INTRAVENOUS
  Filled 2020-07-02: qty 2

## 2020-07-02 MED ORDER — PROPOFOL 10 MG/ML IV BOLUS
INTRAVENOUS | Status: AC
  Filled 2020-07-02: qty 20

## 2020-07-02 MED ORDER — PHENYLEPHRINE HCL (PRESSORS) 10 MG/ML IV SOLN
INTRAVENOUS | Status: DC | PRN
  Administered 2020-07-02: 200 ug via INTRAVENOUS

## 2020-07-02 MED ORDER — ACETAMINOPHEN 500 MG PO TABS: 1000.0000 mg | ORAL_TABLET | Freq: Four times a day (QID) | ORAL | 0 refills | Status: DC

## 2020-07-02 MED ORDER — OXYCODONE HCL 5 MG PO TABS
5.0000 mg | ORAL_TABLET | Freq: Four times a day (QID) | ORAL | Status: DC | PRN
Start: 2020-07-02 — End: 2020-07-02

## 2020-07-02 MED ORDER — FENTANYL CITRATE (PF) 100 MCG/2ML IJ SOLN
INTRAMUSCULAR | Status: AC
  Filled 2020-07-02: qty 2

## 2020-07-02 MED ORDER — ONDANSETRON HCL 4 MG/2ML IJ SOLN
4.0000 mg | Freq: Once | INTRAMUSCULAR | Status: AC
  Administered 2020-07-02: 4 mg via INTRAVENOUS
  Filled 2020-07-02: qty 2

## 2020-07-02 MED ORDER — PROPOFOL 10 MG/ML IV BOLUS
INTRAVENOUS | Status: DC | PRN
  Administered 2020-07-02: 20 mg via INTRAVENOUS
  Administered 2020-07-02: 40 mg via INTRAVENOUS
  Administered 2020-07-02: 180 mg via INTRAVENOUS

## 2020-07-02 MED ORDER — PIPERACILLIN-TAZOBACTAM 3.375 G IVPB
3.3750 g | Freq: Three times a day (TID) | INTRAVENOUS | Status: DC
  Administered 2020-07-02: 3.375 g via INTRAVENOUS

## 2020-07-02 MED ORDER — IOHEXOL 300 MG/ML  SOLN
100.0000 mL | Freq: Once | INTRAMUSCULAR | Status: AC | PRN
  Administered 2020-07-02: 100 mL via INTRAVENOUS
  Filled 2020-07-02: qty 100

## 2020-07-02 MED ORDER — PIPERACILLIN-TAZOBACTAM 3.375 G IVPB 30 MIN
3.3750 g | Freq: Once | INTRAVENOUS | Status: AC
  Administered 2020-07-02: 3.375 g via INTRAVENOUS
  Filled 2020-07-02: qty 50

## 2020-07-02 SURGICAL SUPPLY — 57 items
ADH SKN CLS APL DERMABOND .7 (GAUZE/BANDAGES/DRESSINGS) ×1
APL PRP STRL LF DISP 70% ISPRP (MISCELLANEOUS) ×1
APL SWBSTK 6 STRL LF DISP (MISCELLANEOUS) ×1
APPLICATOR COTTON TIP 6 STRL (MISCELLANEOUS) ×1 IMPLANT
APPLICATOR COTTON TIP 6IN STRL (MISCELLANEOUS) ×2
APPLIER CLIP 5 13 M/L LIGAMAX5 (MISCELLANEOUS) ×2
APR CLP MED LRG 5 ANG JAW (MISCELLANEOUS) ×1
BAG SPEC RTRVL LRG 6X4 10 (ENDOMECHANICALS) ×1
BLADE CLIPPER SURG (BLADE) IMPLANT
BLADE SURG SZ11 CARB STEEL (BLADE) ×2 IMPLANT
CHLORAPREP W/TINT 26 (MISCELLANEOUS) ×2 IMPLANT
CLIP APPLIE 5 13 M/L LIGAMAX5 (MISCELLANEOUS) ×1 IMPLANT
COVER WAND RF STERILE (DRAPES) ×2 IMPLANT
CUTTER FLEX LINEAR 45M (STAPLE) ×2 IMPLANT
DEFOGGER SCOPE WARMER CLEARIFY (MISCELLANEOUS) ×2 IMPLANT
DERMABOND ADVANCED (GAUZE/BANDAGES/DRESSINGS) ×1
DERMABOND ADVANCED .7 DNX12 (GAUZE/BANDAGES/DRESSINGS) ×1 IMPLANT
ELECT CAUTERY BLADE TIP 2.5 (TIP) ×2
ELECT REM PT RETURN 9FT ADLT (ELECTROSURGICAL) ×2
ELECTRODE CAUTERY BLDE TIP 2.5 (TIP) ×1 IMPLANT
ELECTRODE REM PT RTRN 9FT ADLT (ELECTROSURGICAL) ×1 IMPLANT
GLOVE INDICATOR 7.0 STRL GRN (GLOVE) ×6 IMPLANT
GLOVE SURG ENC MOIS LTX SZ6.5 (GLOVE) ×6 IMPLANT
GOWN STRL REUS W/ TWL LRG LVL3 (GOWN DISPOSABLE) ×3 IMPLANT
GOWN STRL REUS W/TWL LRG LVL3 (GOWN DISPOSABLE) ×6
GRASPER SUT TROCAR 14GX15 (MISCELLANEOUS) ×2 IMPLANT
HEMOSTAT SURGICEL 2X3 (HEMOSTASIS) ×2 IMPLANT
IRRIGATION STRYKERFLOW (MISCELLANEOUS) ×1 IMPLANT
IRRIGATOR STRYKERFLOW (MISCELLANEOUS) ×2
IV NS 1000ML (IV SOLUTION) ×2
IV NS 1000ML BAXH (IV SOLUTION) ×1 IMPLANT
KIT TURNOVER KIT A (KITS) ×2 IMPLANT
KITTNER LAPARASCOPIC 5X40 (MISCELLANEOUS) ×2 IMPLANT
LABEL OR SOLS (LABEL) ×2 IMPLANT
MANIFOLD NEPTUNE II (INSTRUMENTS) ×2 IMPLANT
NEEDLE HYPO 22GX1.5 SAFETY (NEEDLE) ×2 IMPLANT
NS IRRIG 500ML POUR BTL (IV SOLUTION) ×2 IMPLANT
PACK LAP CHOLECYSTECTOMY (MISCELLANEOUS) ×2 IMPLANT
PENCIL ELECTRO HAND CTR (MISCELLANEOUS) ×2 IMPLANT
POUCH SPECIMEN RETRIEVAL 10MM (ENDOMECHANICALS) ×2 IMPLANT
RELOAD STAPLE TA45 3.5 REG BLU (ENDOMECHANICALS) ×2 IMPLANT
SCISSORS METZENBAUM CVD 33 (INSTRUMENTS) ×2 IMPLANT
SET TUBE SMOKE EVAC HIGH FLOW (TUBING) ×2 IMPLANT
SHEARS HARMONIC ACE PLUS 36CM (ENDOMECHANICALS) ×2 IMPLANT
SLEEVE ADV FIXATION 5X100MM (TROCAR) ×2 IMPLANT
STRIP CLOSURE SKIN 1/2X4 (GAUZE/BANDAGES/DRESSINGS) ×2 IMPLANT
SUT MNCRL 4-0 (SUTURE) ×2
SUT MNCRL 4-0 27XMFL (SUTURE) ×1
SUT VIC AB 3-0 SH 27 (SUTURE) ×2
SUT VIC AB 3-0 SH 27X BRD (SUTURE) ×1 IMPLANT
SUT VICRYL 0 AB UR-6 (SUTURE) ×2 IMPLANT
SUTURE MNCRL 4-0 27XMF (SUTURE) ×1 IMPLANT
SYS KII FIOS ACCESS ABD 5X100 (TROCAR) ×2
SYSTEM KII FIOS ACES ABD 5X100 (TROCAR) ×1 IMPLANT
TRAY FOLEY MTR SLVR 16FR STAT (SET/KITS/TRAYS/PACK) ×2 IMPLANT
TROCAR ADV FIXATION 12X100MM (TROCAR) IMPLANT
TROCAR BALLN GELPORT 12X130M (ENDOMECHANICALS) ×2 IMPLANT

## 2020-07-02 NOTE — ED Triage Notes (Signed)
Patient ambulatory to triage with steady gait, without difficulty, appears uncomfortable; reports rt lower abd pain, nonradiating accomp by nausea

## 2020-07-02 NOTE — Anesthesia Procedure Notes (Signed)
Procedure Name: Intubation Date/Time: 07/02/2020 2:49 PM Performed by: Hedda Slade, CRNA Pre-anesthesia Checklist: Patient identified, Patient being monitored, Timeout performed, Emergency Drugs available and Suction available Patient Re-evaluated:Patient Re-evaluated prior to induction Oxygen Delivery Method: Circle system utilized Preoxygenation: Pre-oxygenation with 100% oxygen Induction Type: IV induction, Rapid sequence and Cricoid Pressure applied Laryngoscope Size: 3 and McGraph Grade View: Grade I Tube type: Oral Tube size: 7.0 mm Number of attempts: 1 Airway Equipment and Method: Stylet and Video-laryngoscopy Placement Confirmation: ETT inserted through vocal cords under direct vision,  positive ETCO2 and breath sounds checked- equal and bilateral Secured at: 21 cm Tube secured with: Tape Dental Injury: Teeth and Oropharynx as per pre-operative assessment

## 2020-07-02 NOTE — Discharge Instructions (Signed)
AMBULATORY SURGERY  DISCHARGE INSTRUCTIONS   1) The drugs that you were given will stay in your system until tomorrow so for the next 24 hours you should not:  A) Drive an automobile B) Make any legal decisions C) Drink any alcoholic beverage   2) You may resume regular meals tomorrow.  Today it is better to start with liquids and gradually work up to solid foods.  You may eat anything you prefer, but it is better to start with liquids, then soup and crackers, and gradually work up to solid foods.   3) Please notify your doctor immediately if you have any unusual bleeding, trouble breathing, redness and pain at the surgery site, drainage, fever, or pain not relieved by medication. 4)   5) Your post-operative visit with Dr.                                     is: Date:                        Time:    Please call to schedule your post-operative visit.  6) Additional Instructions:       In addition to included general post-operative instructions for laparoscopic appendectomy,  Diet: Resume home diet.   Activity: No heavy lifting >20 pounds (children, pets, laundry, garbage) for 4 weeks, but light activity and walking are encouraged. Do not drive or drink alcohol if taking narcotic pain medications or having pain that might distract from driving.  Wound care: 2 days after surgery (01/29), you may shower/get incision wet with soapy water and pat dry (do not rub incisions), but no baths or submerging incision underwater until follow-up. Steri strips will fall off on their own in 7-10 days.   Medications: Resume all home medications. For mild to moderate pain: acetaminophen (Tylenol) or ibuprofen/naproxen (if no kidney disease). Combining Tylenol with alcohol can substantially increase your risk of causing liver disease. Narcotic pain medications, if prescribed, can be used for severe pain, though may cause nausea, constipation, and drowsiness. Do not combine Tylenol and Percocet (or  similar) within a 6 hour period as Percocet (and similar) contain(s) Tylenol. If you do not need the narcotic pain medication, you do not need to fill the prescription.  Call office [phone] / [phone]) at any time if any questions, worsening pain, fevers/chills, bleeding, drainage from incision site, or other concerns.

## 2020-07-02 NOTE — H&P (Addendum)
[facility] SURGICAL HISTORY & PHYSICAL (cpt [phone])  HISTORY OF PRESENT ILLNESS (HPI):  50 y.o. female presented to [facility] ED today for abdominal pain. Patient reports that she woke up this morning at 0500 and noticed RLQ abdominal pain. This was severe and achy in nature. She got no relief from this throughout the morning and prompted her presentation. She endorse associated chills and nausea. No fever, cough, CP, SOB, urinary changes, or bowel changes. She noted similar pain with her ectopic pregnancy on the left in the past. Only other previous abdominal surgeries are c-sections. Work up in the ED was concerning for acute uncomplicated appendicitis without leukocytosis on laboratory work up.   General surgery is consulted by emergency medicine physician Dr Merlyn Lot, MD for evaluation and management of acute uncomplicated appendicitis.    PAST MEDICAL HISTORY (PMH):  Past Medical History:  Diagnosis Date   Akathisia 12/17/2014   Bipolar affective ([place])    Restless leg syndrome    Schizoaffective disorder ([place])    Schizoaffective disorder ([place])     Reviewed. Otherwise negative.   PAST SURGICAL HISTORY ([place]):  Past Surgical History:  Procedure Laterality Date   CESAREAN SECTION      Reviewed. Otherwise negative.   MEDICATIONS:  Prior to Admission medications   Medication Sig Start Date End Date Taking? Authorizing Provider  pregabalin (LYRICA) 50 MG capsule Take 50 mg by mouth at bedtime as needed.  12/27/18   [provider]  traZODone (DESYREL) 100 MG tablet Take 1 tablet by mouth at bedtime as needed. 12/03/19   [provider]     ALLERGIES:  No Known Allergies   SOCIAL HISTORY:  Social History   Socioeconomic History   Marital status: Married    Spouse name: [name]   Number of children: 2   Years of education: 13   Highest education level: Some college, no degree  Occupational History   Not on file  Tobacco Use   Smoking  status: Current Every Day Smoker    Packs/day: 0.50    Years: 10.00    Pack years: 5.00    Types: Cigarettes   Smokeless tobacco: Never Used  Scientific laboratory technician Use: Former  Substance and Sexual Activity   Alcohol use: No   Drug use: No   Sexual activity: Yes  Other Topics Concern   Not on file  Social History Narrative   26 year old daughter      Married      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   REVIEW OF SYSTEMS:  Review of Systems  Constitutional: Positive for chills. Negative for fever.  HENT: Negative for congestion and sore throat.   Respiratory: Negative for cough and shortness of breath.   Cardiovascular: Negative for chest pain and palpitations.  Gastrointestinal: Positive for abdominal pain and nausea. Negative for blood in stool, constipation, diarrhea and vomiting.  All other systems reviewed and are negative.   VITAL SIGNS:  Temp:  [98 F (36.7 C)] 98 F (36.7  C) (01/27 1223) Pulse Rate:  [77-90] 90 (01/27 1223) Resp:  [15] 15 (01/27 1223) BP: (139-142)/(59-63) 142/63 (01/27 1223) SpO2:  [96 %-97 %] 96 % (01/27 1223) Weight:  [94.8 kg] 94.8 kg (01/27 0651)     Height: 5\' 2"  (157.5 cm) Weight: 94.8 kg BMI (Calculated): 38.22   PHYSICAL EXAM:  Physical Exam Vitals and nursing note reviewed. Exam conducted with a chaperone present.  Constitutional:      General: She is not in acute distress.    Appearance: She is well-developed. She is obese. She is not ill-appearing.  Eyes:     General: No scleral icterus.    Extraocular Movements: Extraocular  movements intact.  Cardiovascular:     Rate and Rhythm: Normal rate and regular rhythm.  Pulmonary:     Effort: Pulmonary effort is normal. No respiratory distress.  Abdominal:     General: A surgical scar is present. There is no distension.     Palpations: Abdomen is soft.     Tenderness: There is abdominal tenderness in the right lower quadrant. There is no guarding or rebound. Positive signs include McBurney's sign. Negative signs include Murphy's sign.  Genitourinary:    Comments: Deferred Skin:    General: Skin is warm and dry.     Coloration: Skin is not jaundiced or pale.  Neurological:     General: No focal deficit present.     Mental Status: She is alert and oriented to person, place, and time.  Psychiatric:        Mood and Affect: Mood normal.        Behavior: Behavior normal.     INTAKE/OUTPUT:  This shift: No intake/output data recorded.  Last 2 shifts: @IOLAST2SHIFTS @  Labs:  CBC Latest Ref Rng & Units 07/02/2020 03/27/2020 12/17/2014  WBC 4.0 - 10.5 K/uL 8.9 9.3 11.1(H)  Hemoglobin 12.0 - 15.0 g/dL 14.0 14.1 13.6  Hematocrit 36.0 - 46.0 % 42.3 42.2 41.3  Platelets 150 - 400 K/uL 358 349.0 318   CMP Latest Ref Rng & Units 07/02/2020 03/27/2020 12/17/2014  Glucose 70 - 99 mg/dL 124(H) 72 126(H)  BUN 6 - 20 mg/dL 18 22 12   Creatinine 0.44 - 1.00 mg/dL 0.69 0.80 0.78  Sodium 135 - 145 mmol/L 138 139 141  Potassium 3.5 - 5.1 mmol/L 4.1 4.5 3.3(L)  Chloride 98 - 111 mmol/L 106 106 112(H)  CO2 22 - 32 mmol/L 21(L) 26 24  Calcium 8.9 - 10.3 mg/dL 9.3 9.4 8.9  Total Protein 6.5 - 8.1 g/dL 7.7 6.9 7.4  Total Bilirubin 0.3 - 1.2 mg/dL 0.7 0.4 0.2(L)  Alkaline Phos 38 - 126 U/L 83 85 70  AST 15 - 41 U/L 23 17 25   ALT 0 - 44 U/L 32 27 24    Imaging studies:   CT Abdomen/Pelvis (07/02/2020) personally reviewed showing inflammation surrounding appendix without free air or fluid, and radiologist report reviewed:  IMPRESSION: 1. Early acute appendicitis. No evidence of  perforation or abscess. 2. Cholelithiasis.    Assessment/Plan: (ICD-10's: K35.80) 50 y.o. female with RLQ abdominal pain found to have acute uncomplicated appendicitis.    - Admit to general surgery    - Will plan on laparoscopic appendectomy this afternoon with Dr Celine Ahr pending OR/Anesthesia availability  - All risks, benefits, and alternatives to above procedure(s) were discussed with the patient, all of her questions were answered to her expressed satisfaction, patient expresses she wishes to proceed, and informed consent was obtained.  - NPO + IVF  Resuscitation  - IV ABx (Zosyn)  - Monitor abdominal examination; on-going bowel function  - Pain control prn; antiemetics prn   - DVT prophylaxis; hold for OR  - Will plan to DC after procedure  All of the above findings and recommendations were discussed with the patient and her family via telephone, and all of their questions were answered to their expressed satisfaction.  -- Edison Simon, PA-C [facility] 07/02/2020, 12:33 PM [phone] M-F: 7am - 4pm  I saw and evaluated the patient.  I agree with the above documentation, exam, and plan, which I have edited where appropriate. Fredirick Maudlin  2:27 PM

## 2020-07-02 NOTE — ED Provider Notes (Signed)
[facility] Emergency Department Provider Note    Event Date/Time   First MD Initiated Contact with Patient 07/02/20 [id number]     (approximate)  I have reviewed the triage vital signs and the nursing notes.   HISTORY  Chief Complaint Abdominal Pain    HPI [name] is a 50 y.o. female below listed past medical history presents to the ER for evaluation of severe right lower quadrant pain that awoke her from sleep this morning.  No history of kidney stones.  Does not feel like she is never had any pain like this before.  No dysuria.  History of tubal ligation and C-section no other previous intra-abdominal surgeries.  No measured fevers.  No chest pain or shortness of breath.  Describes it as a burning and throbbing pain.    Past Medical History:  Diagnosis Date  . Akathisia 12/17/2014  . Bipolar affective ([place])   . Restless leg syndrome   . Schizoaffective disorder ([place])   . Schizoaffective disorder ([place])    Family History  Problem Relation Age of Onset  . Drug abuse Maternal Aunt    Past Surgical History:  Procedure Laterality Date  . CESAREAN SECTION     Patient Active Problem List   Diagnosis Date Noted  . Bipolar affective ([place])   . Encounter for medical examination to establish care 03/27/2020  . Congestive heart failure ([place]) 07/28/2017  . Obesity, unspecified 07/28/2017  . Restless legs 12/17/2014      Prior to Admission medications   Medication Sig Start Date End Date Taking? Authorizing Provider  pregabalin (LYRICA) 50 MG capsule Take 50 mg by mouth at bedtime as needed.  12/27/18   [provider]  traZODone (DESYREL) 100 MG tablet Take 1 tablet by mouth at bedtime as needed. 12/03/19   [provider]    Allergies Patient has no known allergies.    Social History Social History   Tobacco Use  . Smoking status: Current Every Day Smoker    Packs/day: 0.50    Years: 10.00    Pack years: 5.00    Types:  Cigarettes  . Smokeless tobacco: Never Used  Vaping Use  . Vaping Use: Former  Substance Use Topics  . Alcohol use: No  . Drug use: No    Review of Systems Patient denies headaches, rhinorrhea, blurry vision, numbness, shortness of breath, chest pain, edema, cough, abdominal pain, nausea, vomiting, diarrhea, dysuria, fevers, rashes or hallucinations unless otherwise stated above in HPI. ____________________________________________   PHYSICAL EXAM:  VITAL SIGNS: Vitals:   07/02/20 1031 07/02/20 1223  BP: (!) 139/59 (!) 142/63  Pulse: 90 90  Resp:  15  Temp:  98 F (36.7 C)  SpO2: 97% 96%    Constitutional: Alert and oriented.  Eyes: Conjunctivae are normal.  Head: Atraumatic. Nose: No congestion/rhinnorhea. Mouth/Throat: Mucous membranes are moist.   Neck: No stridor. Painless ROM.  Cardiovascular: Normal rate, regular rhythm. Grossly normal heart sounds.  Good peripheral circulation. Respiratory: Normal respiratory effort.  No retractions. Lungs CTAB. Gastrointestinal: Soft with ttp in RLQ. + guarding and rebound ttp. No distention. No abdominal bruits. No CVA tenderness. Genitourinary:  Musculoskeletal: No lower extremity tenderness nor edema.  No joint effusions. Neurologic:  Normal speech and language. No gross focal neurologic deficits are appreciated. No facial droop Skin:  Skin is warm, dry and intact. No rash noted. Psychiatric: Mood and affect are normal. Speech and behavior are normal.  ____________________________________________   LABS (all  labs ordered are listed, but only abnormal results are displayed)  Results for orders placed or performed during the hospital encounter of 07/02/20 (from the past 24 hour(s))  CBC with Differential     Status: None   Collection Time: 07/02/20  6:57 AM  Result Value Ref Range   WBC 8.9 4.0 - 10.5 K/uL   RBC 4.79 3.87 - 5.11 MIL/uL   Hemoglobin 14.0 12.0 - 15.0 g/dL   HCT 42.3 36.0 - 46.0 %   MCV 88.3 80.0 - 100.0 fL    MCH 29.2 26.0 - 34.0 pg   MCHC 33.1 30.0 - 36.0 g/dL   RDW 13.9 11.5 - 15.5 %   Platelets 358 150 - 400 K/uL   nRBC 0.0 0.0 - 0.2 %   Neutrophils Relative % 49 %   Neutro Abs 4.4 1.7 - 7.7 K/uL   Lymphocytes Relative 38 %   Lymphs Abs 3.4 0.7 - 4.0 K/uL   Monocytes Relative 8 %   Monocytes Absolute 0.7 0.1 - 1.0 K/uL   Eosinophils Relative 4 %   Eosinophils Absolute 0.3 0.0 - 0.5 K/uL   Basophils Relative 1 %   Basophils Absolute 0.1 0.0 - 0.1 K/uL   Immature Granulocytes 0 %   Abs Immature Granulocytes 0.03 0.00 - 0.07 K/uL  Comprehensive metabolic panel     Status: Abnormal   Collection Time: 07/02/20  6:57 AM  Result Value Ref Range   Sodium 138 135 - 145 mmol/L   Potassium 4.1 3.5 - 5.1 mmol/L   Chloride 106 98 - 111 mmol/L   CO2 21 (L) 22 - 32 mmol/L   Glucose, Bld 124 (H) 70 - 99 mg/dL   BUN 18 6 - 20 mg/dL   Creatinine, Ser 0.69 0.44 - 1.00 mg/dL   Calcium 9.3 8.9 - 10.3 mg/dL   Total Protein 7.7 6.5 - 8.1 g/dL   Albumin 4.3 3.5 - 5.0 g/dL   AST 23 15 - 41 U/L   ALT 32 0 - 44 U/L   Alkaline Phosphatase 83 38 - 126 U/L   Total Bilirubin 0.7 0.3 - 1.2 mg/dL   GFR, Estimated >60 >60 mL/min   Anion gap 11 5 - 15  Lipase, blood     Status: None   Collection Time: 07/02/20  6:57 AM  Result Value Ref Range   Lipase 26 11 - 51 U/L  hCG, quantitative, pregnancy     Status: Abnormal   Collection Time: 07/02/20  6:57 AM  Result Value Ref Range   hCG, Beta Chain, Quant, S 7 (H) <5 mIU/mL  Urinalysis, Complete w Microscopic Urine, Clean Catch     Status: Abnormal   Collection Time: 07/02/20  6:58 AM  Result Value Ref Range   Color, Urine YELLOW (A) YELLOW   APPearance HAZY (A) CLEAR   Specific Gravity, Urine 1.023 1.005 - 1.030   pH 5.0 5.0 - 8.0   Glucose, UA NEGATIVE NEGATIVE mg/dL   Hgb urine dipstick NEGATIVE NEGATIVE   Bilirubin Urine NEGATIVE NEGATIVE   Ketones, ur NEGATIVE NEGATIVE mg/dL   Protein, ur NEGATIVE NEGATIVE mg/dL   Nitrite NEGATIVE NEGATIVE    Leukocytes,Ua NEGATIVE NEGATIVE   RBC / HPF 0-5 0 - 5 RBC/hpf   WBC, UA 0-5 0 - 5 WBC/hpf   Bacteria, UA RARE (A) NONE SEEN   Squamous Epithelial / LPF 0-5 0 - 5   Mucus PRESENT    Hyaline Casts, UA PRESENT    ____________________________________________  ____________________________________________  RADIOLOGY  I personally reviewed all radiographic images ordered to evaluate for the above acute complaints and reviewed radiology reports and findings.  These findings were personally discussed with the patient.  Please see medical record for radiology report.  ____________________________________________   PROCEDURES  Procedure(s) performed:  Procedures    Critical Care performed: no ____________________________________________   INITIAL IMPRESSION / ASSESSMENT AND PLAN / ED COURSE  Pertinent labs & imaging results that were available during my care of the patient were reviewed by me and considered in my medical decision making (see chart for details).   DDX: stone, appy, cyst, diverticulitis, perf, uti, msk strain  [place] Dead[place] is a 50 y.o. who presents to the ED with acute right lower quadrant pain as described above.  Does have guarding or rebound tenderness.  She is afebrile.  Blood work so far is reassuring but does have significant discomfort therefore IV fluids IV narcotic pain medication and antiemetic will be ordered for the above differential.  Will need imaging.  Awaiting on you pregnancy your hCG  Clinical Course as of 07/02/20 1227  Thu Jul 02, 2020  1018 Patient HCG is mildly elevated. Give h/o tubal ligation will order [place] to rule ectopic.  Remains hemodynamically stable.  [PR]  1226 Ultrasound showed no evidence of IUP.  Feel less consistent with ectopic.  CT imaging ordered which shows evidence of early acute appendicitis.  Will order IV antibiotics.  I have consulted general surgery who agrees to evaluate patient at bedside for further management.  Patient  updated and agreeable to plan. [PR]    Clinical Course User Index [PR] Merlyn Lot, MD    The patient was evaluated in Emergency Department today for the symptoms described in the history of present illness. He/she was evaluated in the context of the global COVID-19 pandemic, which necessitated consideration that the patient might be at risk for infection with the SARS-CoV-2 virus that causes COVID-19. Institutional protocols and algorithms that pertain to the evaluation of patients at risk for COVID-19 are in a state of rapid change based on information released by regulatory bodies including the CDC and federal and state organizations. These policies and algorithms were followed during the patient's care in the ED.  As part of my medical decision making, I reviewed the following data within the [place] notes reviewed and incorporated, Labs reviewed, notes from prior ED visits and [place] Controlled Substance Database   ____________________________________________   FINAL CLINICAL IMPRESSION(S) / ED DIAGNOSES  Final diagnoses:  RLQ abdominal pain  Acute appendicitis with localized peritonitis, unspecified whether abscess present, unspecified whether gangrene present, unspecified whether perforation present      NEW MEDICATIONS STARTED DURING THIS VISIT:  New Prescriptions   No medications on file     Note:  This document was prepared using Dragon voice recognition software and may include unintentional dictation errors.    Merlyn Lot, MD 07/02/20 1228

## 2020-07-02 NOTE — ED Notes (Signed)
Pt reports RLQ abd pain that began around 0515 this am, pain was radiating into back too.

## 2020-07-02 NOTE — Transfer of Care (Signed)
Immediate Anesthesia Transfer of Care Note  Patient: [name]  Procedure(s) Performed: APPENDECTOMY LAPAROSCOPIC (N/A )  Patient Location: PACU  Anesthesia Type:General  Level of Consciousness: awake and alert   Airway & Oxygen Therapy: Patient Spontanous Breathing and Patient connected to face mask oxygen  Post-op Assessment: Report given to RN and Post -op Vital signs reviewed and stable  Post vital signs: Reviewed and stable  Last Vitals:  Vitals Value Taken Time  BP 126/90 07/02/20 1624  Temp 36.5 C 07/02/20 1624  Pulse 100 07/02/20 1625  Resp 19 07/02/20 1625  SpO2 96 % 07/02/20 1625  Vitals shown include unvalidated device data.  Last Pain:  Vitals:   07/02/20 1624  TempSrc:   PainSc: 0-No pain         Complications: No complications documented.

## 2020-07-02 NOTE — Op Note (Signed)
Operative Note  Laparoscopic Appendectomy and laparoscopic adhesiolysis  [name] Date of operation:  07/02/2020  Indications: The patient presented with a history of  abdominal pain. Workup has revealed findings consistent with acute appendicitis.  Pre-operative Diagnosis: Acute appendicitis without perforation or abscess  Post-operative Diagnosis: Same  Surgeon: Fredirick Maudlin, MD  Anesthesia: GETA  Findings: Inflamed appendix without evidence of perforation or abscess; dense adhesions of the omentum to the abdominal wall from prior operative intervention.  Estimated Blood Loss: Less than 5 cc         Specimens: appendix         Complications: None immediately apparent  Procedure Details  The patient was seen again in the preop area. The options of surgery versus observation were reviewed with the patient and/or family. The risks of bleeding, infection, recurrence of symptoms, negative laparoscopy, potential for an open procedure, bowel injury, abscess or infection, were all reviewed as well. The patient was taken to Operating Room, identified as [name] and the procedure verified as laparoscopic appendectomy. A time out was performed and the above information confirmed.  The patient was placed in the supine position and general anesthesia was induced.  Antibiotic prophylaxis was administered and VT E prophylaxis was in place. A Foley catheter was placed by the nursing staff.   The abdomen was prepped and draped in a sterile fashion.  Optiview technique was used to enter the abdomen via a 5 mm trocar in the right upper quadrant. Pneumoperitoneum obtained.  There was a dense mass of omentum stuck to the abdominal wall near the umbilicus.  A left lower quadrant 5 mm port was placed and the adhesions were lysed using the harmonic scalpel in order to facilitate placement of our other ports.  Once the omentum had been freed from the abdominal wall, a 12 mm trocar was  inserted caudal to the umbilicus and a second 5 mm port was placed just above the pubis.  All ports were placed under direct visualization and the skin was infiltrated with local anesthetic prior to placement.   The appendix was identified and found to be acutely inflamed but without perforation or abscess. The appendix was carefully dissected. The mesoappendix was divided with the harmonic scalpel.  The appendiceal artery was controlled with ligaclips. The base of the appendix was dissected out and divided with a standard load Endo GIA.The appendix was placed in a Endo Catch bag and removed via the Hasson port. The right lower quadrant and pelvis was then irrigated with normal saline which was then aspirated. The right lower quadrant was inspected there was no sign of bleeding or bowel injury therefore pneumoperitoneum was released, all ports were removed.  The umbilical fascia was closed with 0 Vicryl interrupted sutures and the skin incisions were approximated with subcuticular 4-0 Monocryl. Dermabond was applied followed by Steri-Strips. The patient tolerated the procedure well and there were no immediately apparent complications. The sponge lap and needle count were correct at the end of the procedure.  The patient was taken to the recovery room in stable condition with plans to discharge from the recovery unit to home.   Fredirick Maudlin, MD, FACS

## 2020-07-02 NOTE — Anesthesia Preprocedure Evaluation (Signed)
Anesthesia Evaluation  Patient identified by MRN, date of birth, ID band Patient awake    Reviewed: Allergy & Precautions, H&P , NPO status , Patient's Chart, lab work & pertinent test results, reviewed documented beta blocker date and time   History of Anesthesia Complications Negative for: history of anesthetic complications  Airway Mallampati: II  TM Distance: >3 FB Neck ROM: full    Dental  (+) Dental Advidsory Given, Teeth Intact, Caps   Pulmonary neg shortness of breath, neg sleep apnea, neg COPD, neg recent URI, Current Smoker,    Pulmonary exam normal breath sounds clear to auscultation       Cardiovascular Exercise Tolerance: Good (-) hypertension(-) angina+CHF (with a pregnancy)  (-) Past MI and (-) Cardiac Stents Normal cardiovascular exam(-) dysrhythmias (-) Valvular Problems/Murmurs Rhythm:regular Rate:Normal     Neuro/Psych PSYCHIATRIC DISORDERS Bipolar Disorder Schizophrenia negative neurological ROS     GI/Hepatic Neg liver ROS, GERD  ,  Endo/Other  diabetes (borderline)  Renal/GU negative Renal ROS  negative genitourinary   Musculoskeletal   Abdominal   Peds  Hematology negative hematology ROS (+)   Anesthesia Other Findings Past Medical History: 12/17/2014: Akathisia No date: Bipolar affective ([place]) No date: Restless leg syndrome No date: Schizoaffective disorder ([facility]) No date: Schizoaffective disorder ([facility])   Reproductive/Obstetrics negative OB ROS                             Anesthesia Physical Anesthesia Plan  ASA: II  Anesthesia Plan: General and Rapid Sequence   Post-op Pain Management:    Induction: Intravenous, Rapid sequence and Cricoid pressure planned  PONV Risk Score and Plan: 2 and Ondansetron, Dexamethasone, Midazolam and Treatment may vary due to age or medical condition  Airway Management Planned: Oral ETT  Additional Equipment:   Intra-op  Plan:   Post-operative Plan: Extubation in OR  Informed Consent: I have reviewed the patients History and Physical, chart, labs and discussed the procedure including the risks, benefits and alternatives for the proposed anesthesia with the patient or authorized representative who has indicated his/her understanding and acceptance.     Dental Advisory Given  Plan Discussed with: Anesthesiologist, CRNA and Surgeon  Anesthesia Plan Comments:         Anesthesia Quick Evaluation

## 2020-07-02 NOTE — Progress Notes (Signed)
Ear rings and necklace removed, placed in plastic bag and put inside boots.

## 2020-07-03 ENCOUNTER — Telehealth: Payer: Self-pay

## 2020-07-03 ENCOUNTER — Encounter: Payer: Self-pay | Admitting: General Surgery

## 2020-07-03 DIAGNOSIS — J9811 Atelectasis: Secondary | ICD-10-CM | POA: Diagnosis present

## 2020-07-03 DIAGNOSIS — G8918 Other acute postprocedural pain: Secondary | ICD-10-CM | POA: Diagnosis not present

## 2020-07-03 DIAGNOSIS — Z20822 Contact with and (suspected) exposure to covid-19: Secondary | ICD-10-CM | POA: Diagnosis present

## 2020-07-03 DIAGNOSIS — Z791 Long term (current) use of non-steroidal anti-inflammatories (NSAID): Secondary | ICD-10-CM

## 2020-07-03 DIAGNOSIS — F17210 Nicotine dependence, cigarettes, uncomplicated: Secondary | ICD-10-CM | POA: Diagnosis present

## 2020-07-03 DIAGNOSIS — M25511 Pain in right shoulder: Secondary | ICD-10-CM | POA: Diagnosis not present

## 2020-07-03 DIAGNOSIS — R1011 Right upper quadrant pain: Secondary | ICD-10-CM | POA: Diagnosis not present

## 2020-07-03 DIAGNOSIS — K3580 Unspecified acute appendicitis: Principal | ICD-10-CM | POA: Diagnosis present

## 2020-07-03 NOTE — Anesthesia Postprocedure Evaluation (Signed)
Anesthesia Post Note  Patient: [name]  Procedure(s) Performed: APPENDECTOMY LAPAROSCOPIC (N/A )  Patient location during evaluation: PACU Anesthesia Type: General Level of consciousness: awake and alert Pain management: pain level controlled Vital Signs Assessment: post-procedure vital signs reviewed and stable Respiratory status: spontaneous breathing, nonlabored ventilation, respiratory function stable and patient connected to nasal cannula oxygen Cardiovascular status: blood pressure returned to baseline and stable Postop Assessment: no apparent nausea or vomiting Anesthetic complications: no   No complications documented.   Last Vitals:  Vitals:   07/02/20 1730 07/02/20 1746  BP: 138/90 (!) 153/93  Pulse: 95 95  Resp:    Temp:    SpO2: 91% 91%    Last Pain:  Vitals:   07/02/20 1746  TempSrc:   PainSc: 4                  Martha Clan

## 2020-07-03 NOTE — Telephone Encounter (Signed)
Pt called to find out why she was not prescribed an antibiotic after her appendectomy. I stated to pt that they will only prescribe an antibiotic if they suspect an infection, which she did not have. Pt states pain in her shoulder. I advised pt that it could be gas from the anesthesia. I advised pt if it was not better by Monday, to call our office.

## 2020-07-03 NOTE — Discharge Summary (Signed)
Physician Discharge Summary  Patient ID: [name] MRN: [id number] DOB/AGE: [birth year] 50 y.o.  Admit date: 07/02/2020 Discharge date: 07/03/2020  Admission Diagnoses: acute appendicitis  Discharge Diagnoses:  Active Problems:   Acute appendicitis s/p laparoscopic appendectomy  Discharged Condition: good  Hospital Course: She presented to [facility] ED today for abdominal pain. Patient reports that she woke up this morning at 0500 and noticed RLQ abdominal pain. This was severe and achy in nature. She got no relief from this throughout the morning and prompted her presentation. She endorse associated chills and nausea. No fever, cough, CP, SOB, urinary changes, or bowel changes. She noted similar pain with her ectopic pregnancy on the left in the past. Only other previous abdominal surgeries are c-sections. Work up in the ED was concerning for acute uncomplicated appendicitis without leukocytosis on laboratory work up.   She underwent an uncomplicated laparoscopic appendectomy and upon recovery was felt suitable for discharge to home.   Consults: None  Significant Diagnostic Studies: radiology: CT scan: Consistent with acute appendicitis  Treatments: antibiotics: Zosyn and surgery: Laparoscopic appendectomy  Discharge Exam: Blood pressure (!) 153/93, pulse 95, temperature 97.7 F (36.5 C), resp. rate 19, height 5\' 2"  (1.575 m), weight 94.8 kg, SpO2 91 %. She was awake and alert.  Vital signs were stable.  Pain was well controlled.  Surgical incisions were clean dry and intact with Steri-Strips in place.  Disposition: Discharge disposition: 01-Home or Self Care       Discharge Instructions    Call MD for:  difficulty breathing, headache or visual disturbances   Complete by: As directed    Call MD for:  hives   Complete by: As directed    Call MD for:  persistant dizziness or light-headedness   Complete by: As directed    Call MD for:  persistant nausea and vomiting    Complete by: As directed    Call MD for:  redness, tenderness, or signs of infection (pain, swelling, redness, odor or green/yellow discharge around incision site)   Complete by: As directed    Call MD for:  severe uncontrolled pain   Complete by: As directed    Call MD for:  temperature >100.4   Complete by: As directed    Diet - low sodium heart healthy   Complete by: As directed    Discharge wound care:   Complete by: As directed    You may shower starting on Saturday.  Do not submerge the incisions or allow them to become saturated with water or sweat.  It is okay if they get a little bit damp; just pat them dry gently with a clean soft towel.  Allow the Steri-Strips (paper tapes) to fall off on their own.   Driving Restrictions   Complete by: As directed    No driving for 1 week or while taking narcotic pain medications.   Increase activity slowly   Complete by: As directed    Lifting restrictions   Complete by: As directed    Do not lift, push, or pull anything heavier than 10 pounds for 3 weeks.     Allergies as of 07/02/2020   No Known Allergies     Medication List    TAKE these medications   acetaminophen 500 MG tablet Commonly known as: TYLENOL Take 2 tablets (1,000 mg total) by mouth every 6 (six) hours.   ibuprofen 800 MG tablet Commonly known as: ADVIL Take 1 tablet (800 mg total) by mouth every 8 (  eight) hours as needed.   ondansetron 4 MG disintegrating tablet Commonly known as: ZOFRAN-ODT Take 1 tablet (4 mg total) by mouth every 6 (six) hours as needed for nausea.   oxyCODONE 5 MG immediate release tablet Commonly known as: Oxy IR/ROXICODONE Take 1 tablet (5 mg total) by mouth every 6 (six) hours as needed for severe pain.   pregabalin 50 MG capsule Commonly known as: LYRICA Take 50 mg by mouth at bedtime as needed.   traZODone 100 MG tablet Commonly known as: DESYREL Take 1 tablet by mouth at bedtime as needed.            Discharge Care  Instructions  (From admission, onward)         Start     Ordered   07/02/20 0000  Discharge wound care:       Comments: You may shower starting on Saturday.  Do not submerge the incisions or allow them to become saturated with water or sweat.  It is okay if they get a little bit damp; just pat them dry gently with a clean soft towel.  Allow the Steri-Strips (paper tapes) to fall off on their own.   07/02/20 1633          Follow-up Information    Tylene Fantasia, PA-C. Schedule an appointment as soon as possible for a visit in 2 week(s).   Specialty: Physician Assistant Why: s/p lap appy , call office in AM to schedule appointment. Contact information: [address] [place] [address] [phone]               Signed: Fredirick Maudlin 07/03/2020, 7:20 AM

## 2020-07-04 ENCOUNTER — Other Ambulatory Visit: Payer: Self-pay

## 2020-07-04 ENCOUNTER — Emergency Department: Payer: 59

## 2020-07-04 ENCOUNTER — Encounter: Payer: Self-pay | Admitting: Emergency Medicine

## 2020-07-04 ENCOUNTER — Emergency Department (HOSPITAL_BASED_OUTPATIENT_CLINIC_OR_DEPARTMENT_OTHER): Payer: 59

## 2020-07-04 ENCOUNTER — Inpatient Hospital Stay
Admission: EM | Admit: 2020-07-04 | Discharge: 2020-07-06 | DRG: 342 | Disposition: A | Discharge status: Home / Self Care | Payer: 59 | Attending: General Surgery | Admitting: General Surgery

## 2020-07-04 DIAGNOSIS — R079 Chest pain, unspecified: Secondary | ICD-10-CM | POA: Diagnosis not present

## 2020-07-04 DIAGNOSIS — J9589 Other postprocedural complications and disorders of respiratory system, not elsewhere classified: Secondary | ICD-10-CM | POA: Diagnosis not present

## 2020-07-04 DIAGNOSIS — K9189 Other postprocedural complications and disorders of digestive system: Secondary | ICD-10-CM | POA: Diagnosis not present

## 2020-07-04 DIAGNOSIS — M25511 Pain in right shoulder: Secondary | ICD-10-CM | POA: Diagnosis not present

## 2020-07-04 DIAGNOSIS — J9601 Acute respiratory failure with hypoxia: Secondary | ICD-10-CM | POA: Diagnosis not present

## 2020-07-04 DIAGNOSIS — R109 Unspecified abdominal pain: Secondary | ICD-10-CM | POA: Diagnosis not present

## 2020-07-04 DIAGNOSIS — R1031 Right lower quadrant pain: Secondary | ICD-10-CM | POA: Diagnosis present

## 2020-07-04 DIAGNOSIS — R160 Hepatomegaly, not elsewhere classified: Secondary | ICD-10-CM | POA: Diagnosis not present

## 2020-07-04 DIAGNOSIS — G8918 Other acute postprocedural pain: Secondary | ICD-10-CM | POA: Diagnosis not present

## 2020-07-04 DIAGNOSIS — J9811 Atelectasis: Secondary | ICD-10-CM | POA: Diagnosis not present

## 2020-07-04 DIAGNOSIS — J189 Pneumonia, unspecified organism: Secondary | ICD-10-CM

## 2020-07-04 DIAGNOSIS — R0600 Dyspnea, unspecified: Secondary | ICD-10-CM | POA: Diagnosis not present

## 2020-07-04 DIAGNOSIS — R1011 Right upper quadrant pain: Secondary | ICD-10-CM | POA: Diagnosis not present

## 2020-07-04 DIAGNOSIS — R0602 Shortness of breath: Secondary | ICD-10-CM | POA: Diagnosis not present

## 2020-07-04 DIAGNOSIS — R911 Solitary pulmonary nodule: Secondary | ICD-10-CM | POA: Diagnosis not present

## 2020-07-04 DIAGNOSIS — K3580 Unspecified acute appendicitis: Secondary | ICD-10-CM | POA: Diagnosis present

## 2020-07-04 DIAGNOSIS — A419 Sepsis, unspecified organism: Secondary | ICD-10-CM | POA: Diagnosis not present

## 2020-07-04 DIAGNOSIS — K8020 Calculus of gallbladder without cholecystitis without obstruction: Secondary | ICD-10-CM | POA: Diagnosis not present

## 2020-07-04 DIAGNOSIS — Z791 Long term (current) use of non-steroidal anti-inflammatories (NSAID): Secondary | ICD-10-CM | POA: Diagnosis not present

## 2020-07-04 DIAGNOSIS — Z20822 Contact with and (suspected) exposure to covid-19: Secondary | ICD-10-CM | POA: Diagnosis present

## 2020-07-04 DIAGNOSIS — F17210 Nicotine dependence, cigarettes, uncomplicated: Secondary | ICD-10-CM | POA: Diagnosis present

## 2020-07-04 DIAGNOSIS — K668 Other specified disorders of peritoneum: Secondary | ICD-10-CM | POA: Diagnosis not present

## 2020-07-04 DIAGNOSIS — Z9049 Acquired absence of other specified parts of digestive tract: Secondary | ICD-10-CM | POA: Diagnosis not present

## 2020-07-04 LAB — COMPREHENSIVE METABOLIC PANEL
ALT: 24 U/L (ref 0–44)
AST: 29 U/L (ref 15–41)
Albumin: 4 g/dL (ref 3.5–5.0)
Alkaline Phosphatase: 68 U/L (ref 38–126)
Anion gap: 14 (ref 5–15)
BUN: 17 mg/dL (ref 6–20)
CO2: 23 mmol/L (ref 22–32)
Calcium: 9.3 mg/dL (ref 8.9–10.3)
Chloride: 105 mmol/L (ref 98–111)
Creatinine, Ser: 0.86 mg/dL (ref 0.44–1.00)
GFR, Estimated: >60 mL/min (ref >60)
Glucose, Bld: 125 mg/dL — ABNORMAL HIGH (ref 70–99)
Potassium: 4 mmol/L (ref 3.5–5.1)
Sodium: 142 mmol/L (ref 135–145)
Total Bilirubin: 0.8 mg/dL (ref 0.3–1.2)
Total Protein: 7.9 g/dL (ref 6.5–8.1)

## 2020-07-04 LAB — POC URINE PREG, ED: Preg Test, Ur: NEGATIVE

## 2020-07-04 LAB — BASIC METABOLIC PANEL
Anion gap: 11 (ref 5–15)
BUN: 13 mg/dL (ref 6–20)
CO2: 23 mmol/L (ref 22–32)
Calcium: 8.6 mg/dL — ABNORMAL LOW (ref 8.9–10.3)
Chloride: 104 mmol/L (ref 98–111)
Creatinine, Ser: 0.68 mg/dL (ref 0.44–1.00)
GFR, Estimated: >60 mL/min (ref >60)
Glucose, Bld: 97 mg/dL (ref 70–99)
Potassium: 3.5 mmol/L (ref 3.5–5.1)
Sodium: 138 mmol/L (ref 135–145)

## 2020-07-04 LAB — CBC
HCT: 33.9 % — ABNORMAL LOW (ref 36.0–46.0)
HCT: 34.9 % — ABNORMAL LOW (ref 36.0–46.0)
HCT: 39.8 % (ref 36.0–46.0)
Hemoglobin: 11 g/dL — ABNORMAL LOW (ref 12.0–15.0)
Hemoglobin: 11.4 g/dL — ABNORMAL LOW (ref 12.0–15.0)
Hemoglobin: 13.1 g/dL (ref 12.0–15.0)
MCH: 29.3 pg (ref 26.0–34.0)
MCH: 29.5 pg (ref 26.0–34.0)
MCH: 29.7 pg (ref 26.0–34.0)
MCHC: 32.4 g/dL (ref 30.0–36.0)
MCHC: 32.7 g/dL (ref 30.0–36.0)
MCHC: 32.9 g/dL (ref 30.0–36.0)
MCV: 90.2 fL (ref 80.0–100.0)
MCV: 90.2 fL (ref 80.0–100.0)
MCV: 90.2 fL (ref 80.0–100.0)
Platelets: 296 10*3/uL (ref 150–400)
Platelets: 297 10*3/uL (ref 150–400)
Platelets: 349 10*3/uL (ref 150–400)
RBC: 3.76 MIL/uL — ABNORMAL LOW (ref 3.87–5.11)
RBC: 3.87 MIL/uL (ref 3.87–5.11)
RBC: 4.41 MIL/uL (ref 3.87–5.11)
RDW: 14.3 % (ref 11.5–15.5)
RDW: 14.3 % (ref 11.5–15.5)
RDW: 14.6 % (ref 11.5–15.5)
WBC: 13.7 10*3/uL — ABNORMAL HIGH (ref 4.0–10.5)
WBC: 15.9 10*3/uL — ABNORMAL HIGH (ref 4.0–10.5)
WBC: 20.1 10*3/uL — ABNORMAL HIGH (ref 4.0–10.5)
nRBC: 0 % (ref 0.0–0.2)
nRBC: 0 % (ref 0.0–0.2)
nRBC: 0 % (ref 0.0–0.2)

## 2020-07-04 LAB — URINALYSIS, COMPLETE (UACMP) WITH MICROSCOPIC
Bacteria, UA: NONE SEEN
Bilirubin Urine: NEGATIVE
Glucose, UA: NEGATIVE mg/dL
Hgb urine dipstick: NEGATIVE
Ketones, ur: NEGATIVE mg/dL
Leukocytes,Ua: NEGATIVE
Nitrite: NEGATIVE
Protein, ur: NEGATIVE mg/dL
Specific Gravity, Urine: 1.018 (ref 1.005–1.030)
pH: 5 (ref 5.0–8.0)

## 2020-07-04 LAB — LACTIC ACID, PLASMA
Lactic Acid, Venous: 1.1 mmol/L (ref 0.5–1.9)
Lactic Acid, Venous: 0.8 mmol/L (ref 0.5–1.9)
Lactic Acid, Venous: 0.9 mmol/L (ref 0.5–1.9)

## 2020-07-04 LAB — PROCALCITONIN: Procalcitonin: <0.1 ng/mL

## 2020-07-04 LAB — SARS CORONAVIRUS 2 BY RT PCR (HOSPITAL ORDER, PERFORMED IN ~~LOC~~ HOSPITAL LAB): SARS Coronavirus 2: NEGATIVE

## 2020-07-04 LAB — HIV ANTIBODY (ROUTINE TESTING W REFLEX): HIV Screen 4th Generation wRfx: NONREACTIVE

## 2020-07-04 LAB — LIPASE, BLOOD: Lipase: 22 U/L (ref 11–51)

## 2020-07-04 LAB — POC SARS CORONAVIRUS 2 AG -  ED: SARS Coronavirus 2 Ag: NEGATIVE

## 2020-07-04 MED ORDER — PIPERACILLIN-TAZOBACTAM 3.375 G IVPB
3.3750 g | Freq: Three times a day (TID) | INTRAVENOUS | Status: DC
  Administered 2020-07-04 – 2020-07-06 (×6): 3.375 g via INTRAVENOUS
  Filled 2020-07-04 (×8): qty 50

## 2020-07-04 MED ORDER — ONDANSETRON HCL 4 MG/2ML IJ SOLN: 4.0000 mg | Freq: Four times a day (QID) | INTRAMUSCULAR | Status: DC | PRN

## 2020-07-04 MED ORDER — MIDAZOLAM HCL 2 MG/2ML IJ SOLN
1.0000 mg | Freq: Once | INTRAMUSCULAR | Status: AC
  Administered 2020-07-04: 1 mg via INTRAVENOUS
  Filled 2020-07-04: qty 2

## 2020-07-04 MED ORDER — LACTATED RINGERS IV SOLN
125.0000 mL/h | INTRAVENOUS | Status: DC
  Administered 2020-07-04 – 2020-07-05 (×3): 125 mL/h via INTRAVENOUS

## 2020-07-04 MED ORDER — POLYETHYLENE GLYCOL 3350 17 G PO PACK
17.0000 g | PACK | Freq: Every day | ORAL | Status: DC | PRN
  Administered 2020-07-06: 17 g via ORAL
  Filled 2020-07-04 (×2): qty 1

## 2020-07-04 MED ORDER — LACTATED RINGERS IV BOLUS (SEPSIS)
1000.0000 mL | Freq: Once | INTRAVENOUS | Status: AC
  Administered 2020-07-04: 1000 mL via INTRAVENOUS

## 2020-07-04 MED ORDER — ENOXAPARIN SODIUM 40 MG/0.4ML ~~LOC~~ SOLN
40.0000 mg | SUBCUTANEOUS | Status: DC
  Administered 2020-07-05: 40 mg via SUBCUTANEOUS
  Filled 2020-07-04: qty 0.4

## 2020-07-04 MED ORDER — PANTOPRAZOLE SODIUM 40 MG IV SOLR
40.0000 mg | Freq: Two times a day (BID) | INTRAVENOUS | Status: DC
  Administered 2020-07-04 – 2020-07-06 (×5): 40 mg via INTRAVENOUS
  Filled 2020-07-04 (×6): qty 40

## 2020-07-04 MED ORDER — PIPERACILLIN-TAZOBACTAM 3.375 G IVPB 30 MIN
3.3750 g | Freq: Once | INTRAVENOUS | Status: AC
  Administered 2020-07-04: 3.375 g via INTRAVENOUS
  Filled 2020-07-04: qty 50

## 2020-07-04 MED ORDER — HYDROMORPHONE HCL 1 MG/ML IJ SOLN
0.5000 mg | INTRAMUSCULAR | Status: DC | PRN
  Administered 2020-07-04 – 2020-07-05 (×5): 0.5 mg via INTRAVENOUS
  Filled 2020-07-04 (×5): qty 1

## 2020-07-04 MED ORDER — SIMETHICONE 40 MG/0.6ML PO SUSP (UNIT DOSE)
40.0000 mg | Freq: Once | ORAL | Status: AC
  Administered 2020-07-04: 40 mg via ORAL
  Filled 2020-07-04: qty 0.6

## 2020-07-04 MED ORDER — KETOROLAC TROMETHAMINE 30 MG/ML IJ SOLN
30.0000 mg | Freq: Four times a day (QID) | INTRAMUSCULAR | Status: DC
  Administered 2020-07-04 – 2020-07-06 (×9): 30 mg via INTRAVENOUS
  Filled 2020-07-04 (×9): qty 1

## 2020-07-04 MED ORDER — IOHEXOL 350 MG/ML SOLN
100.0000 mL | Freq: Once | INTRAVENOUS | Status: AC | PRN
  Administered 2020-07-04: 100 mL via INTRAVENOUS

## 2020-07-04 MED ORDER — ONDANSETRON 4 MG PO TBDP: 4.0000 mg | ORAL_TABLET | Freq: Four times a day (QID) | ORAL | Status: DC | PRN

## 2020-07-04 MED ORDER — PANTOPRAZOLE SODIUM 40 MG IV SOLR: 40.0000 mg | Freq: Every day | INTRAVENOUS | Status: DC

## 2020-07-04 MED ORDER — MORPHINE SULFATE (PF) 4 MG/ML IV SOLN
4.0000 mg | Freq: Once | INTRAVENOUS | Status: AC
  Administered 2020-07-04: 4 mg via INTRAVENOUS
  Filled 2020-07-04: qty 1

## 2020-07-04 NOTE — ED Notes (Signed)
Pt moved to sit in chair next to bed.  Pt c/o extreme pain w/movement and continues to yell out occasionally.  Pt requested medicine for gas.  VO for simethicone received from Dr. Karma Greaser and will give to pt.

## 2020-07-04 NOTE — ED Notes (Signed)
This RN heard pt screaming and rushed to bedside. This RN visualized pt standing beside stretcher with hands on the stretcher. Pt stated "I have restless leg and have gas up to my neck. I had an appendectomy so I'm full of gas in my stomach. I don't know why you're in here" This RN stated that I was there due to the yelling and wanted to make sure pt was stable/safe. Pt stated "well I just have to deal with it my way"  This RN assisted pt to the bathroom on request at this time. Pt reminded to pull red cord for any assistance.

## 2020-07-04 NOTE — ED Notes (Deleted)
     Lucrezia Starch, MD 07/04/20 1010

## 2020-07-04 NOTE — ED Notes (Signed)
Pt assisted to bathroom

## 2020-07-04 NOTE — ED Notes (Signed)
Assumed care at this time. Pt is ambulatory and stands in room.

## 2020-07-04 NOTE — ED Notes (Signed)
This RN to bedside, surgeon at bedside at this time.

## 2020-07-04 NOTE — ED Notes (Signed)
Pt noted to by hypoxic, with sats between 88-90% on RA.  2L [place] re-applied to pt with improvement to 93%.

## 2020-07-04 NOTE — Consult Note (Signed)
Pharmacy Antibiotic Note  [name] is a 50 y.o. female admitted on 07/04/2020 with sepsis.    Pharmacy has been consulted for Zosyn dosing.  Plan: Zosyn 3.375g IV q8h (4 hour infusion).  Height: 5\' 2"  (157.5 cm) Weight: 95.3 kg (210 lb) IBW/kg (Calculated) : 50.1  Temp (24hrs), Avg:99.1 F (37.3 C), Min:99.1 F (37.3 C), Max:99.1 F (37.3 C)  Recent Labs  Lab 07/02/20 0657 07/04/20 0025 07/04/20 0604  WBC 8.9 20.1*  --   CREATININE 0.69 0.86  --   LATICACIDVEN  --   --  1.1    Estimated Creatinine Clearance: 85.2 mL/min (by C-G formula based on SCr of 0.86 mg/dL).    No Known Allergies  Antimicrobials this admission: Zosyn 1/29 >>     Dose adjustments this admission: n/a  Microbiology results: 1/29 BCx: pending  COVID NEG  Thank you for allowing pharmacy to be a part of this patient's care.  Lu Duffel, PharmD, BCPS Clinical Pharmacist 07/04/2020 8:52 AM

## 2020-07-04 NOTE — Consult Note (Signed)
CODE SEPSIS - PHARMACY COMMUNICATION  **Broad Spectrum Antibiotics should be administered within 1 hour of Sepsis diagnosis**  Time Code Sepsis Called/Page Received: 0700  Antibiotics Ordered: zosyn  Time of 1st antibiotic administration: 0619  Additional action taken by pharmacy: none  If necessary, Name of Provider/Nurse Contacted: [place] ,PharmD Clinical Pharmacist  07/04/2020  7:17 AM

## 2020-07-04 NOTE — ED Notes (Signed)
Pt sitting comfortably in chair at this time.  Pt given phone to call family.  Pt with call light within reach and instructed to call if she has any needs.

## 2020-07-04 NOTE — ED Triage Notes (Signed)
Pt presents to ER via EMS with complaints of RUQ pain, and shortness of breath. Pt reports had laparoscopy appendectomy yesterday and  Went home today reports severe RUQ pain, has taken her prescribed medications for pain 2 hrs prior to arrival with no relief. Pt becomes agitated redirected to take small deep breaths.

## 2020-07-04 NOTE — ED Notes (Addendum)
Medication administered as ordered. [name] at bedside, Dr. Hampton Abbot to bedside at this time to assess patient and update patient's daughter.

## 2020-07-04 NOTE — ED Notes (Signed)
Pt repositioned to left side.

## 2020-07-04 NOTE — ED Notes (Signed)
Took over care of pt. Pt in NAD at this time. VSS. Awaiting further orders. Will continue to monitor.  

## 2020-07-04 NOTE — H&P (Addendum)
Date of Admission:  07/04/2020  Reason for Admission:  Abdominal pain, pneumoperitoneum  History of Present Illness: [name] is a 50 y.o. female s/p laparoscopic appendectomy with Dr. Celine Ahr on 07/02/20.  She was discharged the same day.  She reports that yesterday morning she started having soreness in the RUQ and towards the right shoulder.  Later on in the evening, while she was getting up, she felt a "ripping" sensation in the RUQ and felt that the pain had worsened. She had [place] food for dinner and her pain got worse after that as well.  She also felt that there was more abdominal distention and difficulty breathing.  She presented to the ER last night.  Her O2 saturation was in the upper 80s and recovered well with 2L [place].  Laboratory workup initially showed an elevated WBC of 20.1, with normal electrolytes and LFTs.  CXR showed air under the diaphragm and also bilateral basilar atelectasis.  CTA chest as well as CT abdomen/pelvis was obtained to evaluate for PE and for abdominal etiology.  There was no PE, but she again had bilateral ground glass opacities, and also a moderate amount of pneumoperitoneum.  I have personally viewed all the images.  Overall, the appendix staple line is intact and goes retrocecal which is how the appendix was prior to surgery.  There is no fat stranding anywhere, and there is no bowel wall thickening of stomach, small , or large instestine.  There is no significant amount of free fluid either.  In light of this, it is suspected per radiology that the free air is residual from her laparoscopic appendectomy.  Further workup in the ED showed a normal lactic acid of 1.1 and procalcitonin of <0.10.  COVID-19 test was negative x 2.  Past Medical History: Past Medical History:  Diagnosis Date  . Akathisia 12/17/2014  . Bipolar affective ([place])   . Restless leg syndrome   . Schizoaffective disorder ([place])   . Schizoaffective disorder [facility])      Past Surgical  History: Past Surgical History:  Procedure Laterality Date  . CESAREAN SECTION    . LAPAROSCOPIC APPENDECTOMY N/A 07/02/2020   Procedure: APPENDECTOMY LAPAROSCOPIC;  Surgeon: Fredirick Maudlin, MD;  Location: [facility] ORS;  Service: General;  Laterality: N/A;    Home Medications: Prior to Admission medications   Medication Sig Start Date End Date Taking? Authorizing Provider  acetaminophen (TYLENOL) 500 MG tablet Take 2 tablets (1,000 mg total) by mouth every 6 (six) hours. 07/02/20   Fredirick Maudlin, MD  ibuprofen (ADVIL) 800 MG tablet Take 1 tablet (800 mg total) by mouth every 8 (eight) hours as needed. 07/02/20   Fredirick Maudlin, MD  ondansetron (ZOFRAN-ODT) 4 MG disintegrating tablet Take 1 tablet (4 mg total) by mouth every 6 (six) hours as needed for nausea. 07/02/20   Fredirick Maudlin, MD  oxyCODONE (OXY IR/ROXICODONE) 5 MG immediate release tablet Take 1 tablet (5 mg total) by mouth every 6 (six) hours as needed for severe pain. 07/02/20   Fredirick Maudlin, MD  pregabalin (LYRICA) 50 MG capsule Take 50 mg by mouth at bedtime as needed.  12/27/18   [provider]  traZODone (DESYREL) 100 MG tablet Take 1 tablet by mouth at bedtime as needed. 12/03/19   [provider]    Allergies: No Known Allergies  Social History:  reports that she has been smoking cigarettes. She has a 5.00 pack-year smoking history. She has never used smokeless tobacco. She reports that she does not  drink alcohol and does not use drugs.   Family History: Family History  Problem Relation Age of Onset  . Drug abuse Maternal Aunt     Review of Systems: Review of Systems  Constitutional: Negative for chills and fever.  HENT: Negative for hearing loss.   Respiratory: Positive for shortness of breath. Negative for cough.   Cardiovascular: Negative for chest pain.  Gastrointestinal: Positive for abdominal pain. Negative for constipation, diarrhea, nausea and vomiting.  Genitourinary: Negative for  dysuria.  Musculoskeletal: Positive for myalgias (right shoulder).  Skin: Negative for rash.  Neurological: Negative for dizziness.  Psychiatric/Behavioral: Negative for depression.    Physical Exam BP 108/68 (BP Location: Right Arm)   Pulse 84   Temp 99.1 F (37.3 C) (Oral)   Resp 18   Ht '5\' 2"'  (1.575 m)   Wt 95.3 kg   SpO2 96%   BMI 38.41 kg/m  CONSTITUTIONAL: Appears uncomfortable, but no acute distress HEENT:  Normocephalic, atraumatic, extraocular motion intact. NECK: Trachea is midline, and there is no jugular venous distension.  RESPIRATORY:  Normal respiratory effort without pathologic use of accessory muscles.  On 2L [place] with O2 sat 95% CARDIOVASCULAR:  Regular rhythm and rate. GI: The abdomen is soft, distended, with tenderness to palpation in the RUQ.  There is some soreness in other areas, but the point of most tenderness is RUQ.  Recent incisions are clean, without erythema, covered with steri strips.   MUSCULOSKELETAL:  Normal muscle strength and tone in all four extremities.  No peripheral edema or cyanosis. SKIN: Skin turgor is normal. There are no pathologic skin lesions.  NEUROLOGIC:  Motor and sensation is grossly normal.  Cranial nerves are grossly intact. PSYCH:  Alert and oriented to person, place and time. Affect is normal.  Laboratory Analysis: Results for orders placed or performed during the hospital encounter of 07/04/20 (from the past 24 hour(s))  Urinalysis, Complete w Microscopic     Status: Abnormal   Collection Time: 07/04/20 12:22 AM  Result Value Ref Range   Color, Urine YELLOW (A) YELLOW   APPearance HAZY (A) CLEAR   Specific Gravity, Urine 1.018 1.005 - 1.030   pH 5.0 5.0 - 8.0   Glucose, UA NEGATIVE NEGATIVE mg/dL   Hgb urine dipstick NEGATIVE NEGATIVE   Bilirubin Urine NEGATIVE NEGATIVE   Ketones, ur NEGATIVE NEGATIVE mg/dL   Protein, ur NEGATIVE NEGATIVE mg/dL   Nitrite NEGATIVE NEGATIVE   Leukocytes,Ua NEGATIVE NEGATIVE   RBC / HPF  0-5 0 - 5 RBC/hpf   WBC, UA 0-5 0 - 5 WBC/hpf   Bacteria, UA NONE SEEN NONE SEEN   Squamous Epithelial / LPF 6-10 0 - 5   Mucus PRESENT   Lipase, blood     Status: None   Collection Time: 07/04/20 12:25 AM  Result Value Ref Range   Lipase 22 11 - 51 U/L  Comprehensive metabolic panel     Status: Abnormal   Collection Time: 07/04/20 12:25 AM  Result Value Ref Range   Sodium 142 135 - 145 mmol/L   Potassium 4.0 3.5 - 5.1 mmol/L   Chloride 105 98 - 111 mmol/L   CO2 23 22 - 32 mmol/L   Glucose, Bld 125 (H) 70 - 99 mg/dL   BUN 17 6 - 20 mg/dL   Creatinine, Ser 0.86 0.44 - 1.00 mg/dL   Calcium 9.3 8.9 - 10.3 mg/dL   Total Protein 7.9 6.5 - 8.1 g/dL   Albumin 4.0 3.5 - 5.0 g/dL  AST 29 15 - 41 U/L   ALT 24 0 - 44 U/L   Alkaline Phosphatase 68 38 - 126 U/L   Total Bilirubin 0.8 0.3 - 1.2 mg/dL   GFR, Estimated >60 >60 mL/min   Anion gap 14 5 - 15  CBC     Status: Abnormal   Collection Time: 07/04/20 12:25 AM  Result Value Ref Range   WBC 20.1 (H) 4.0 - 10.5 K/uL   RBC 4.41 3.87 - 5.11 MIL/uL   Hemoglobin 13.1 12.0 - 15.0 g/dL   HCT 39.8 36.0 - 46.0 %   MCV 90.2 80.0 - 100.0 fL   MCH 29.7 26.0 - 34.0 pg   MCHC 32.9 30.0 - 36.0 g/dL   RDW 14.3 11.5 - 15.5 %   Platelets 349 150 - 400 K/uL   nRBC 0.0 0.0 - 0.2 %  SARS Coronavirus 2 by RT PCR (hospital order, performed in [place] hospital lab) Nasopharyngeal Nasopharyngeal Swab     Status: None   Collection Time: 07/04/20  5:12 AM   Specimen: Nasopharyngeal Swab  Result Value Ref Range   SARS Coronavirus 2 NEGATIVE NEGATIVE  POC SARS Coronavirus 2 Ag-ED - Nasal Swab (BD Veritor Kit)     Status: Normal   Collection Time: 07/04/20  5:36 AM  Result Value Ref Range   SARS Coronavirus 2 Ag Negative Negative  Lactic acid, plasma     Status: None   Collection Time: 07/04/20  6:04 AM  Result Value Ref Range   Lactic Acid, Venous 1.1 0.5 - 1.9 mmol/L  Blood Culture (routine x 2)     Status: None (Preliminary result)   Collection  Time: 07/04/20  6:04 AM   Specimen: BLOOD  Result Value Ref Range   Specimen Description BLOOD BLOOD LEFT FOREARM    Special Requests      BOTTLES DRAWN AEROBIC AND ANAEROBIC Blood Culture adequate volume   Culture      NO GROWTH <12 HOURS Performed at [facility], [address] [place], [place] [address]    Report Status PENDING   Procalcitonin - Baseline     Status: None   Collection Time: 07/04/20  6:04 AM  Result Value Ref Range   Procalcitonin <0.10 ng/mL    Imaging: CT Angio Chest PE W/Cm &/Or Wo Cm  Result Date: 07/04/2020 CLINICAL DATA:  Abdominal distension abdominal pain nonlocalized. Onset severe within a couple of days of laparoscopic appendectomy. EXAM: CT ANGIOGRAPHY CHEST CT ABDOMEN AND PELVIS WITH CONTRAST TECHNIQUE: Multidetector CT imaging of the chest was performed using the standard protocol during bolus administration of intravenous contrast. Multiplanar CT image reconstructions and MIPs were obtained to evaluate the vascular anatomy. Multidetector CT imaging of the abdomen and pelvis was performed using the standard protocol during bolus administration of intravenous contrast. CONTRAST:  120m OMNIPAQUE IOHEXOL 350 MG/ML SOLN COMPARISON:  Chest x-ray 07/04/2020, CT abdomen pelvis 07/02/2020. FINDINGS: CTA CHEST FINDINGS Cardiovascular: Satisfactory opacification of the pulmonary arteries to the proximal segmental level. No evidence of pulmonary embolism. Normal heart size. No pericardial effusion. The thoracic aorta is normal in caliber. No definite coronary artery calcifications. Mediastinum/Nodes: No enlarged mediastinal, hilar, or axillary lymph nodes. Thyroid gland, trachea, and esophagus demonstrate no significant findings. Lungs/Pleura: Expiratory phase of respiration. Low lung volumes. Scattered patchy ground-glass airspace opacities. Bilateral lower lobe linear and subsegmental atelectasis. Linear atelectasis within bilateral upper lobes. Limited  evaluation for pulmonary nodule. No definite pulmonary mass. No pleural effusion. No pneumothorax. Musculoskeletal: No  chest wall abnormality No suspicious lytic or blastic osseous lesions. No acute displaced fracture. Review of the MIP images confirms the above findings. CT ABDOMEN and PELVIS FINDINGS Hepatobiliary: No focal liver abnormality. Calcified gallstones measuring up to at least 2 cm within the gallbladder lumen. No gallbladder wall thickening or pericholecystic fluid. No biliary dilatation. Pancreas: No focal lesion. Normal pancreatic contour. No surrounding inflammatory changes. No main pancreatic ductal dilatation. Spleen: Normal in size without focal abnormality. Adrenals/Urinary Tract: No adrenal nodule bilaterally. Bilateral kidneys enhance symmetrically. No hydronephrosis. No hydroureter. The urinary bladder is unremarkable. Stomach/Bowel: Stomach is within normal limits. No evidence of bowel wall thickening or dilatation. Stool throughout the ascending colon. Descending colon and rectosigmoid colon are decompressed. Status post appendectomy with no right lower quadrant inflammatory change. Linear appearance of the surgical staples posterior to the cecum of unclear etiology. Vascular/Lymphatic: No abdominal aorta or iliac aneurysm. No abdominal, pelvic, or inguinal lymphadenopathy. Reproductive: Uterus and bilateral adnexa are unremarkable. Other: Moderate volume pneumoperitoneum. No free intraperitoneal flu fluid. No organized fluid collection. Musculoskeletal: Subcutaneus soft tissue emphysema along the umbilical laparoscopic site likely postsurgical. No suspicious lytic or blastic osseous lesions. No acute displaced fracture. Review of the MIP images confirms the above findings. IMPRESSION: 1. Moderate volume pneumoperitoneum likely postsurgical in etiology. No definite inflammatory changes to suggest staple/suture dehiscence or bowel perforation status post appendectomy. 2. Scattered patchy  ground-glass airspace opacities may be due to expiratory phase of respiration; however, overlying infection such as COVID-19 not excluded. 3. No central or segmental pulmonary embolus. Limited evaluation due to timing of contrast and motion artifact. 4. Cholelithiasis with no acute cholecystitis. These results were called by telephone at the time of interpretation on 07/04/2020 at 2:20 am to provider [facility] , who verbally acknowledged these results. Electronically Signed   By: Iven Finn M.D.   On: 07/04/2020 02:24   CT Abdomen Pelvis W Contrast  Result Date: 07/04/2020 CLINICAL DATA:  Abdominal distension abdominal pain nonlocalized. Onset severe within a couple of days of laparoscopic appendectomy. EXAM: CT ANGIOGRAPHY CHEST CT ABDOMEN AND PELVIS WITH CONTRAST TECHNIQUE: Multidetector CT imaging of the chest was performed using the standard protocol during bolus administration of intravenous contrast. Multiplanar CT image reconstructions and MIPs were obtained to evaluate the vascular anatomy. Multidetector CT imaging of the abdomen and pelvis was performed using the standard protocol during bolus administration of intravenous contrast. CONTRAST:  188m OMNIPAQUE IOHEXOL 350 MG/ML SOLN COMPARISON:  Chest x-ray 07/04/2020, CT abdomen pelvis 07/02/2020. FINDINGS: CTA CHEST FINDINGS Cardiovascular: Satisfactory opacification of the pulmonary arteries to the proximal segmental level. No evidence of pulmonary embolism. Normal heart size. No pericardial effusion. The thoracic aorta is normal in caliber. No definite coronary artery calcifications. Mediastinum/Nodes: No enlarged mediastinal, hilar, or axillary lymph nodes. Thyroid gland, trachea, and esophagus demonstrate no significant findings. Lungs/Pleura: Expiratory phase of respiration. Low lung volumes. Scattered patchy ground-glass airspace opacities. Bilateral lower lobe linear and subsegmental atelectasis. Linear atelectasis within bilateral upper  lobes. Limited evaluation for pulmonary nodule. No definite pulmonary mass. No pleural effusion. No pneumothorax. Musculoskeletal: No chest wall abnormality No suspicious lytic or blastic osseous lesions. No acute displaced fracture. Review of the MIP images confirms the above findings. CT ABDOMEN and PELVIS FINDINGS Hepatobiliary: No focal liver abnormality. Calcified gallstones measuring up to at least 2 cm within the gallbladder lumen. No gallbladder wall thickening or pericholecystic fluid. No biliary dilatation. Pancreas: No focal lesion. Normal pancreatic contour. No surrounding inflammatory changes. No main pancreatic ductal  dilatation. Spleen: Normal in size without focal abnormality. Adrenals/Urinary Tract: No adrenal nodule bilaterally. Bilateral kidneys enhance symmetrically. No hydronephrosis. No hydroureter. The urinary bladder is unremarkable. Stomach/Bowel: Stomach is within normal limits. No evidence of bowel wall thickening or dilatation. Stool throughout the ascending colon. Descending colon and rectosigmoid colon are decompressed. Status post appendectomy with no right lower quadrant inflammatory change. Linear appearance of the surgical staples posterior to the cecum of unclear etiology. Vascular/Lymphatic: No abdominal aorta or iliac aneurysm. No abdominal, pelvic, or inguinal lymphadenopathy. Reproductive: Uterus and bilateral adnexa are unremarkable. Other: Moderate volume pneumoperitoneum. No free intraperitoneal flu fluid. No organized fluid collection. Musculoskeletal: Subcutaneus soft tissue emphysema along the umbilical laparoscopic site likely postsurgical. No suspicious lytic or blastic osseous lesions. No acute displaced fracture. Review of the MIP images confirms the above findings. IMPRESSION: 1. Moderate volume pneumoperitoneum likely postsurgical in etiology. No definite inflammatory changes to suggest staple/suture dehiscence or bowel perforation status post appendectomy. 2.  Scattered patchy ground-glass airspace opacities may be due to expiratory phase of respiration; however, overlying infection such as COVID-19 not excluded. 3. No central or segmental pulmonary embolus. Limited evaluation due to timing of contrast and motion artifact. 4. Cholelithiasis with no acute cholecystitis. These results were called by telephone at the time of interpretation on 07/04/2020 at 2:20 am to provider [facility] , who verbally acknowledged these results. Electronically Signed   By: Iven Finn M.D.   On: 07/04/2020 02:24   DG Chest Portable 1 View  Result Date: 07/04/2020 CLINICAL DATA:  Dyspnea, history of recent appendectomy EXAM: PORTABLE CHEST 1 VIEW COMPARISON:  None. FINDINGS: Cardiac shadow is mildly prominent accentuated by the portable technique. Free air is noted beneath the right hemidiaphragm consistent with the recent laparoscopy. Overall inspiratory effort is poor. Bibasilar atelectatic changes are noted as well as crowding of the vascular markings. No pneumothorax is seen. IMPRESSION: Bibasilar atelectatic changes with vascular crowding related to poor inspiratory effort. Free air is noted in the abdomen related to the recent laparoscopy. Electronically Signed   By: Inez Catalina M.D.   On: 07/04/2020 01:05    Assessment and Plan: This is a 50 y.o. female s/p laparoscopic appendectomy with worsening RUQ and shoulder pain, associated with pneumoperitoneum on CT scan.  --On initial evaluation, the pain was significantly tender in the RUQ.  Her vital signs have remained normal without any hypotension or tachycardia.  Her lactic acid is normal as well as her procalcitonin.  Her WBC is elevated to 20, but unclear if this could be reactive from her surgery and atelectasis.  Initial read by radiology does not identify any source for the pneumoperitoneum, and I have also viewed the images and agree with no clear source.  I also discussed the images with one of the radiologists on  call and he also agreed that there was no source.   --The patient was given a dose of IV toradol and she reports that she had significant improvement in her pain, and that she could breath a lot better.  I think if there was truly an occult bowel injury or a perforation, this would not be the case. --I discussed with the patient, her husband, and her daughter about the case, my thought process about her presentation and her labs/images.  Discussed with them that for now, given the significant improvement, would admit the patient and do serial labs, abdominal exam, keep her NPO, with IV antibiotics and IV fluids.  If there is any sign of  deterioration in her exam or clinical findings, then we would proceed with taking her to the OR for further evaluation.  Discussed with her the role for diagnostic laparoscopy but if there was a true injury or perforation, would convert to open surgery in the form of exploratory laparotomy.  She understands this and is in agreement with this plan. --The patient and her family had also mentioned about transferring her to a tertiary center such as [facility] or [facility].  After further discussion, they have decided to keep her here at [facility] for the time being.   Melvyn Neth, MD [facility]:  [phone]

## 2020-07-04 NOTE — ED Notes (Signed)
Pt up and ambulatory to the toilet in the room; sitting on bedside chair afterwards; reports this position provides relief from shoulder/rib pain

## 2020-07-04 NOTE — ED Notes (Signed)
Pt resting in bed with eyes closed, awakens easily with this RN arrival to bedside. Lights dimmed for comfort. VSS. Dr. Hampton Abbot made aware that per daughter, no longer requesting transfer at this time.

## 2020-07-04 NOTE — ED Provider Notes (Signed)
[facility] Emergency Department Provider Note  ____________________________________________   Event Date/Time   First MD Initiated Contact with Patient 07/04/20 0036     (approximate)  I have reviewed the triage vital signs and the nursing notes.   HISTORY  Chief Complaint Abdominal Pain and Postpartum Complications  Level 5 caveat:  history/ROS limited by acute/critical illness  HPI [name] is a 50 y.o. female with medical history as listed below and who had laparoscopic appendectomy by Dr. Celine Ahr within the last 2 days.  She presents for acute onset and severe shortness of breath and right upper abdominal pain.  She feels like something "popped" in her right upper abdomen or right side of her chest and that she has not been able to breathe.  She comes in by EMS yelling loudly and screaming in   pain.  Nothing in particular makes the symptoms better and moving around makes it worse.  She denies fever, nausea, vomiting, sore throat.  She had a negative Covid test just prior to her surgery.  She thought she was doing well from the surgery but became severely symptomatic within the last couple of hours.        Past Medical History:  Diagnosis Date  . Akathisia 12/17/2014  . Bipolar affective ([place])   . Restless leg syndrome   . Schizoaffective disorder ([place])   . Schizoaffective disorder [facility])     Patient Active Problem List   Diagnosis Date Noted  . Acute appendicitis 07/02/2020  . Bipolar affective ([place])   . Encounter for medical examination to establish care 03/27/2020  . Congestive heart failure ([place]) 07/28/2017  . Obesity, unspecified 07/28/2017  . Restless legs 12/17/2014    Past Surgical History:  Procedure Laterality Date  . CESAREAN SECTION    . LAPAROSCOPIC APPENDECTOMY N/A 07/02/2020   Procedure: APPENDECTOMY LAPAROSCOPIC;  Surgeon: Fredirick Maudlin, MD;  Location: [facility] ORS;  Service: General;  Laterality: N/A;    Prior to  Admission medications   Medication Sig Start Date End Date Taking? Authorizing Provider  acetaminophen (TYLENOL) 500 MG tablet Take 2 tablets (1,000 mg total) by mouth every 6 (six) hours. 07/02/20   Fredirick Maudlin, MD  ibuprofen (ADVIL) 800 MG tablet Take 1 tablet (800 mg total) by mouth every 8 (eight) hours as needed. 07/02/20   Fredirick Maudlin, MD  ondansetron (ZOFRAN-ODT) 4 MG disintegrating tablet Take 1 tablet (4 mg total) by mouth every 6 (six) hours as needed for nausea. 07/02/20   Fredirick Maudlin, MD  oxyCODONE (OXY IR/ROXICODONE) 5 MG immediate release tablet Take 1 tablet (5 mg total) by mouth every 6 (six) hours as needed for severe pain. 07/02/20   Fredirick Maudlin, MD  pregabalin (LYRICA) 50 MG capsule Take 50 mg by mouth at bedtime as needed.  12/27/18   [provider]  traZODone (DESYREL) 100 MG tablet Take 1 tablet by mouth at bedtime as needed. 12/03/19   [provider]    Allergies Patient has no known allergies.  Family History  Problem Relation Age of Onset  . Drug abuse Maternal Aunt     Social History Social History   Tobacco Use  . Smoking status: Current Every Day Smoker    Packs/day: 0.50    Years: 10.00    Pack years: 5.00    Types: Cigarettes  . Smokeless tobacco: Never Used  Vaping Use  . Vaping Use: Former  Substance Use Topics  . Alcohol use: No  . Drug use:  No    Review of Systems Level 5 caveat:  history/ROS limited by acute/critical illness   Cardiovascular: Positive for right-sided chest/rib pain. Respiratory: Positive for shortness of breath. Gastrointestinal: Positive for right upper quadrant abdominal pain.     ____________________________________________   PHYSICAL EXAM:  VITAL SIGNS: ED Triage Vitals  Enc Vitals Group     BP 07/04/20 0019 (!) 150/74     Pulse Rate 07/04/20 0019 95     Resp 07/04/20 0019 (!) 24     Temp 07/04/20 0019 99.1 F (37.3 C)     Temp Source 07/04/20 0019 Oral     SpO2 07/04/20  0019 93 %     Weight 07/04/20 0020 95.3 kg (210 lb)     Height 07/04/20 0020 1.575 m (5\' 2" )     Head Circumference --      Peak Flow --      Pain Score 07/04/20 0020 10     Pain Loc --      Pain Edu? --      Excl. in [place]? --     Constitutional: Alert and oriented.  In moderate to severe distress due to pain. Eyes: Conjunctivae are normal.  Head: Atraumatic. Nose: No congestion/rhinnorhea. Mouth/Throat: Patient is wearing a mask. Neck: No stridor.  No meningeal signs.   Cardiovascular: Normal rate, regular rhythm. Good peripheral circulation. Respiratory: Tachypnea.  Splinting.  Accessory muscle usage but the patient is taking rapid short shallow breaths. Gastrointestinal: Obese.  Tender to palpation globally, no specific local area of peritonitis. Musculoskeletal: No lower extremity tenderness nor edema. No gross deformities of extremities. Neurologic:  Normal speech and language. No gross focal neurologic deficits are appreciated.  Skin:  Skin is warm, dry and intact. Psychiatric: Mood and affect are very anxious.  Pain appears to be out of proportion to exam but difficult to appreciate in this recent postoperative patient.  ____________________________________________   LABS (all labs ordered are listed, but only abnormal results are displayed)  Labs Reviewed  COMPREHENSIVE METABOLIC PANEL - Abnormal; Notable for the following components:      Result Value   Glucose, Bld 125 (*)    All other components within normal limits  CBC - Abnormal; Notable for the following components:   WBC 20.1 (*)    All other components within normal limits  URINALYSIS, COMPLETE (UACMP) WITH MICROSCOPIC - Abnormal; Notable for the following components:   Color, Urine YELLOW (*)    APPearance HAZY (*)    All other components within normal limits  POC SARS CORONAVIRUS 2 AG -  ED - Normal  SARS CORONAVIRUS 2 BY RT PCR (HOSPITAL ORDER, [place] [facility])  CULTURE, BLOOD  (ROUTINE X 2)  CULTURE, BLOOD (ROUTINE X 2)  LIPASE, BLOOD  LACTIC ACID, PLASMA  PROCALCITONIN   ____________________________________________  EKG  ED ECG REPORT I, Hinda Kehr, the attending physician, personally viewed and interpreted this ECG.  Date: 07/04/2020 EKG Time: 00:34 Rate: 91 Rhythm: normal sinus rhythm QRS Axis: normal Intervals: normal ST/T Wave abnormalities: Non-specific ST segment / T-wave changes, but no clear evidence of acute ischemia. Narrative Interpretation: no definitive evidence of acute ischemia; does not meet STEMI criteria.   ____________________________________________  RADIOLOGY I, Hinda Kehr, personally viewed and evaluated these images (plain radiographs) as part of my medical decision making, as well as reviewing the written report by the radiologist.  I also discussed the results of the CT scans by phone with the radiologist who called me  with a verbal report.  ED MD interpretation: No evidence of pulmonary embolism.  Extensive groundglass opacity/multifocal pneumonia in bilateral lungs.  No obvious acute abnormality, postsurgical or otherwise, identified in the abdomen.  Official radiology report(s): CT Angio Chest PE W/Cm &/Or Wo Cm  Result Date: 07/04/2020 CLINICAL DATA:  Abdominal distension abdominal pain nonlocalized. Onset severe within a couple of days of laparoscopic appendectomy. EXAM: CT ANGIOGRAPHY CHEST CT ABDOMEN AND PELVIS WITH CONTRAST TECHNIQUE: Multidetector CT imaging of the chest was performed using the standard protocol during bolus administration of intravenous contrast. Multiplanar CT image reconstructions and MIPs were obtained to evaluate the vascular anatomy. Multidetector CT imaging of the abdomen and pelvis was performed using the standard protocol during bolus administration of intravenous contrast. CONTRAST:  169mL OMNIPAQUE IOHEXOL 350 MG/ML SOLN COMPARISON:  Chest x-ray 07/04/2020, CT abdomen pelvis 07/02/2020.  FINDINGS: CTA CHEST FINDINGS Cardiovascular: Satisfactory opacification of the pulmonary arteries to the proximal segmental level. No evidence of pulmonary embolism. Normal heart size. No pericardial effusion. The thoracic aorta is normal in caliber. No definite coronary artery calcifications. Mediastinum/Nodes: No enlarged mediastinal, hilar, or axillary lymph nodes. Thyroid gland, trachea, and esophagus demonstrate no significant findings. Lungs/Pleura: Expiratory phase of respiration. Low lung volumes. Scattered patchy ground-glass airspace opacities. Bilateral lower lobe linear and subsegmental atelectasis. Linear atelectasis within bilateral upper lobes. Limited evaluation for pulmonary nodule. No definite pulmonary mass. No pleural effusion. No pneumothorax. Musculoskeletal: No chest wall abnormality No suspicious lytic or blastic osseous lesions. No acute displaced fracture. Review of the MIP images confirms the above findings. CT ABDOMEN and PELVIS FINDINGS Hepatobiliary: No focal liver abnormality. Calcified gallstones measuring up to at least 2 cm within the gallbladder lumen. No gallbladder wall thickening or pericholecystic fluid. No biliary dilatation. Pancreas: No focal lesion. Normal pancreatic contour. No surrounding inflammatory changes. No main pancreatic ductal dilatation. Spleen: Normal in size without focal abnormality. Adrenals/Urinary Tract: No adrenal nodule bilaterally. Bilateral kidneys enhance symmetrically. No hydronephrosis. No hydroureter. The urinary bladder is unremarkable. Stomach/Bowel: Stomach is within normal limits. No evidence of bowel wall thickening or dilatation. Stool throughout the ascending colon. Descending colon and rectosigmoid colon are decompressed. Status post appendectomy with no right lower quadrant inflammatory change. Linear appearance of the surgical staples posterior to the cecum of unclear etiology. Vascular/Lymphatic: No abdominal aorta or iliac aneurysm. No  abdominal, pelvic, or inguinal lymphadenopathy. Reproductive: Uterus and bilateral adnexa are unremarkable. Other: Moderate volume pneumoperitoneum. No free intraperitoneal flu fluid. No organized fluid collection. Musculoskeletal: Subcutaneus soft tissue emphysema along the umbilical laparoscopic site likely postsurgical. No suspicious lytic or blastic osseous lesions. No acute displaced fracture. Review of the MIP images confirms the above findings. IMPRESSION: 1. Moderate volume pneumoperitoneum likely postsurgical in etiology. No definite inflammatory changes to suggest staple/suture dehiscence or bowel perforation status post appendectomy. 2. Scattered patchy ground-glass airspace opacities may be due to expiratory phase of respiration; however, overlying infection such as COVID-19 not excluded. 3. No central or segmental pulmonary embolus. Limited evaluation due to timing of contrast and motion artifact. 4. Cholelithiasis with no acute cholecystitis. These results were called by telephone at the time of interpretation on 07/04/2020 at 2:20 am to provider [facility] , who verbally acknowledged these results. Electronically Signed   By: Iven Finn M.D.   On: 07/04/2020 02:24   CT Abdomen Pelvis W Contrast  Result Date: 07/04/2020 CLINICAL DATA:  Abdominal distension abdominal pain nonlocalized. Onset severe within a couple of days of laparoscopic appendectomy. EXAM: CT ANGIOGRAPHY CHEST  CT ABDOMEN AND PELVIS WITH CONTRAST TECHNIQUE: Multidetector CT imaging of the chest was performed using the standard protocol during bolus administration of intravenous contrast. Multiplanar CT image reconstructions and MIPs were obtained to evaluate the vascular anatomy. Multidetector CT imaging of the abdomen and pelvis was performed using the standard protocol during bolus administration of intravenous contrast. CONTRAST:  110mL OMNIPAQUE IOHEXOL 350 MG/ML SOLN COMPARISON:  Chest x-ray 07/04/2020, CT abdomen pelvis  07/02/2020. FINDINGS: CTA CHEST FINDINGS Cardiovascular: Satisfactory opacification of the pulmonary arteries to the proximal segmental level. No evidence of pulmonary embolism. Normal heart size. No pericardial effusion. The thoracic aorta is normal in caliber. No definite coronary artery calcifications. Mediastinum/Nodes: No enlarged mediastinal, hilar, or axillary lymph nodes. Thyroid gland, trachea, and esophagus demonstrate no significant findings. Lungs/Pleura: Expiratory phase of respiration. Low lung volumes. Scattered patchy ground-glass airspace opacities. Bilateral lower lobe linear and subsegmental atelectasis. Linear atelectasis within bilateral upper lobes. Limited evaluation for pulmonary nodule. No definite pulmonary mass. No pleural effusion. No pneumothorax. Musculoskeletal: No chest wall abnormality No suspicious lytic or blastic osseous lesions. No acute displaced fracture. Review of the MIP images confirms the above findings. CT ABDOMEN and PELVIS FINDINGS Hepatobiliary: No focal liver abnormality. Calcified gallstones measuring up to at least 2 cm within the gallbladder lumen. No gallbladder wall thickening or pericholecystic fluid. No biliary dilatation. Pancreas: No focal lesion. Normal pancreatic contour. No surrounding inflammatory changes. No main pancreatic ductal dilatation. Spleen: Normal in size without focal abnormality. Adrenals/Urinary Tract: No adrenal nodule bilaterally. Bilateral kidneys enhance symmetrically. No hydronephrosis. No hydroureter. The urinary bladder is unremarkable. Stomach/Bowel: Stomach is within normal limits. No evidence of bowel wall thickening or dilatation. Stool throughout the ascending colon. Descending colon and rectosigmoid colon are decompressed. Status post appendectomy with no right lower quadrant inflammatory change. Linear appearance of the surgical staples posterior to the cecum of unclear etiology. Vascular/Lymphatic: No abdominal aorta or iliac  aneurysm. No abdominal, pelvic, or inguinal lymphadenopathy. Reproductive: Uterus and bilateral adnexa are unremarkable. Other: Moderate volume pneumoperitoneum. No free intraperitoneal flu fluid. No organized fluid collection. Musculoskeletal: Subcutaneus soft tissue emphysema along the umbilical laparoscopic site likely postsurgical. No suspicious lytic or blastic osseous lesions. No acute displaced fracture. Review of the MIP images confirms the above findings. IMPRESSION: 1. Moderate volume pneumoperitoneum likely postsurgical in etiology. No definite inflammatory changes to suggest staple/suture dehiscence or bowel perforation status post appendectomy. 2. Scattered patchy ground-glass airspace opacities may be due to expiratory phase of respiration; however, overlying infection such as COVID-19 not excluded. 3. No central or segmental pulmonary embolus. Limited evaluation due to timing of contrast and motion artifact. 4. Cholelithiasis with no acute cholecystitis. These results were called by telephone at the time of interpretation on 07/04/2020 at 2:20 am to provider [facility] , who verbally acknowledged these results. Electronically Signed   By: Iven Finn M.D.   On: 07/04/2020 02:24   DG Chest Portable 1 View  Result Date: 07/04/2020 CLINICAL DATA:  Dyspnea, history of recent appendectomy EXAM: PORTABLE CHEST 1 VIEW COMPARISON:  None. FINDINGS: Cardiac shadow is mildly prominent accentuated by the portable technique. Free air is noted beneath the right hemidiaphragm consistent with the recent laparoscopy. Overall inspiratory effort is poor. Bibasilar atelectatic changes are noted as well as crowding of the vascular markings. No pneumothorax is seen. IMPRESSION: Bibasilar atelectatic changes with vascular crowding related to poor inspiratory effort. Free air is noted in the abdomen related to the recent laparoscopy. Electronically Signed   By: Elta [place]  Lukens M.D.   On: 07/04/2020 01:05     ____________________________________________   PROCEDURES   Procedure(s) performed (including Critical Care):  .Critical Care Performed by: Hinda Kehr, MD Authorized by: Hinda Kehr, MD   Critical care provider statement:    Critical care time (minutes):  60   Critical care time was exclusive of:  Separately billable procedures and treating other patients   Critical care was necessary to treat or prevent imminent or life-threatening deterioration of the following conditions:  Sepsis and respiratory failure   Critical care was time spent personally by me on the following activities:  Development of treatment plan with patient or surrogate, discussions with consultants, evaluation of patient's response to treatment, examination of patient, obtaining history from patient or surrogate, ordering and performing treatments and interventions, ordering and review of laboratory studies, ordering and review of radiographic studies, pulse oximetry, re-evaluation of patient's condition and review of old charts .1-3 Lead EKG Interpretation Performed by: Hinda Kehr, MD Authorized by: Hinda Kehr, MD     Interpretation: normal     ECG rate:  98   ECG rate assessment: normal     Rhythm: sinus rhythm     Ectopy: none     Conduction: normal       ____________________________________________   INITIAL IMPRESSION / MDM / ASSESSMENT AND PLAN / ED COURSE  As part of my medical decision making, I reviewed the following data within the [place] notes reviewed and incorporated, Labs reviewed , EKG interpreted , Old chart reviewed, Patient signed out to Dr. Kerman Passey, Discussed with radiologist, A consult was requested and obtained from this/these consultant(s) Surgery and Notes from prior ED visits   Differential diagnosis includes, but is not limited to, pneumothorax, pulmonary embolism, COVID-19, pneumonia, sepsis, surgical complications such as dehiscence  of wound internally.  The patient is on the cardiac monitor to evaluate for evidence of arrhythmia and/or significant heart rate changes.  Given the severe pain, acute in onset, and multiple comorbidities and recent surgery, and given the way the pain dyspnea seem to be out of proportion to the patient's exam, I am most concerned about a pneumothorax.  Chest x-ray pending.  Basic lab work is pending as well.  I strongly doubt acute infection given that she is so recently postsurgical, she is not tachycardic (although the heart rate is over 90), and she is afebrile.  I think it is much more likely that there is a mechanical issue such as pneumothorax or wound dehiscence.  If the chest x-ray is clear I anticipate CT scans.  (Delayed report): Lab work is notable for an essentially normal comprehensive metabolic panel and normal lipase.  CBC is notable for a leukocytosis of 20.1 which could be reactive and postsurgical or could represent an acute infection.  Her white blood cell count while she was hospitalized for acute appendicitis was less than 9.  She continues to not be tachycardic and is feeling a little bit better but still has a lot of pain and shortness of breath with movement.       Clinical Course as of 07/04/20 0731  Sat Jul 04, 2020  0130 DG Chest Portable 1 View I personally reviewed the chest x-ray and I called and spoke with the radiologist about the film to confirm that there is in fact no evidence of a pneumothorax.  The free air underneath the diaphragm is from her recent laparoscopic surgery.  She continues to be anxious and uncomfortable. We  talked about various options and we agreed to try Versed 1 mg IV to help calm her down and see if this helps with her discomfort. I think it is likely that she is experiencing pain from the gas from laparoscopic surgery. However given the possibility of pulmonary embolism given the severity of her symptoms which seems out of proportion, as well  as what seems to be hypoxemia which could be due to poor respiratory effort and splinting but could also be the result of pulmonary embolism, I will proceed with a CTA chest to rule out PE as well as CT abdomen pelvis since she feels like something "popped" and is having severe abdominal pain as well.  [CF]  0159 Patient reportedly had a recent beta-hCG which was 7. We do not have urine pregnancy test on her today but she was willing to sign the paper saying that she assumes any responsibility for being scanned if there is a possibility she could be pregnant. [CF]  0219 CT Abdomen Pelvis W Contrast Discussed scans by phone with the radiologist who called me.  There is no pneumoperitoneum after the laparoscopic surgery but it seems unlikely that there is an emergent or acute process and there is no obvious sign of internal wound dehiscence or other surgical complication. [CF]  0221 Patient is still in severe pain, I ordered morphine 4 mg IV [CF]  0513 The patient was doing better from a pain perspective even though the pain is persistent.  However we took her off of her supplemental oxygen and she has been satting as low as 88% at rest.  Given the groundglass opacities seen on the chest CT, we will obtain Covid testing even though she was just tested prior to her surgery.  If she continues to be hypoxemic and in pain and short of breath, she will require admission regardless of the COVID results.  However this does not seem to be an acute complication of her surgery. [CF]  [id number] The patient has continued to be hypoxemic.  Not all of the measurements were recorded by the nurses, but she has consistently dropped down to 88% on room air once her supplemental oxygen was turned off.  This is at rest.  It is unclear if this is due to her splinting or a physical or mechanical process. [CF]  [phone] The patient continues to have a slight oxygen requirement and to be in pain.  Given that she has a recent postsurgical  patient, I called and spoke by phone with Dr. Hampton Abbot.  Upon reviewing the imaging, he is concerned about the appearance of the lungs as previously described, but he is also a little bit concerned about the presence of some free air around the patient's kidney.  He confirmed that the appendix was retrocecal but in case her presentation represents a surgical complication he requested that I give Zosyn 3.375 g IV.  He will be reviewing the imaging in the coming in to see the patient.  I think this is appropriate given her leukocytosis of 20.1 which could be normal in the setting of her surgical changes or could represent sepsis.  I ordered a lactic acid and procalcitonin and lactated Ringer's 1 L of fluids.  She is not tachycardic but she remains tachypneic since shortness of breath is one of her primary issues. [CF]  0602 No evidence of septic shock but the patient does meet criteria for sepsis.  She has already received antibiotics as recommended by surgery.  She has  received 1 L lactated Ringer's.  She does not require 30 mL/kg based on not presenting as septic shock.  She has a normal lactic acid.  I am initiating code sepsis and the patient is awaiting evaluation by surgery services (Dr. Hampton Abbot). [CF]  0641 Lactic Acid, Venous: 1.1 [CF]  0658 SARS Coronavirus 2: NEGATIVE [CF]  0710 Dr. Hampton Abbot with general surgery came to the emergency department and discussed the case with me in person.  He will personally evaluate the patient.  I have transferred care in the emergency department to Dr. Kerman Passey but anticipate the patient will be admitted either by Dr. Hampton Abbot or by the hospitalist service, but that will be something that Dr. Hampton Abbot can coordinate with the hospitalist service. [CF]    Clinical Course User Index [CF] Hinda Kehr, MD     ____________________________________________  FINAL CLINICAL IMPRESSION(S) / ED DIAGNOSES  Final diagnoses:  Sepsis, due to unspecified organism,  unspecified whether acute organ dysfunction present [facility])  Acute respiratory failure with hypoxemia (HCC)  Multifocal pneumonia  Postoperative pain     MEDICATIONS GIVEN DURING THIS VISIT:  Medications  midazolam (VERSED) injection 1 mg (1 mg Intravenous Given 07/04/20 0135)  iohexol (OMNIPAQUE) 350 MG/ML injection 100 mL (100 mLs Intravenous Contrast Given 07/04/20 0148)  morphine 4 MG/ML injection 4 mg (4 mg Intravenous Given 07/04/20 0227)  simethicone (MYLICON) 40 WI/2.0BT suspension 40 mg (40 mg Oral Given 07/04/20 0614)  lactated ringers bolus 1,000 mL (1,000 mLs Intravenous New Bag/Given 07/04/20 0613)  piperacillin-tazobactam (ZOSYN) IVPB 3.375 g (0 g Intravenous Stopped 07/04/20 5974)     ED Discharge Orders    None      *Please note:  ADAIA MATTHIES was evaluated in Emergency Department on 07/04/2020 for the symptoms described in the history of present illness. She was evaluated in the context of the global COVID-19 pandemic, which necessitated consideration that the patient might be at risk for infection with the SARS-CoV-2 virus that causes COVID-19. Institutional protocols and algorithms that pertain to the evaluation of patients at risk for COVID-19 are in a state of rapid change based on information released by regulatory bodies including the CDC and federal and state organizations. These policies and algorithms were followed during the patient's care in the ED.  Some ED evaluations and interventions may be delayed as a result of limited staffing during and after the pandemic.*  Note:  This document was prepared using Dragon voice recognition software and may include unintentional dictation errors.   Hinda Kehr, MD 07/04/20 [phone]

## 2020-07-04 NOTE — ED Provider Notes (Signed)
-----------------------------------------   7:50 AM on 07/04/2020 -----------------------------------------  Patient has been seen and examined by surgery.  They will be admitting to their service for further work-up and treatment.   Harvest Dark, MD 07/04/20 [phone]

## 2020-07-04 NOTE — ED Notes (Signed)
This RN to bedside, introduced self to patient. Purewick placed back on patient at this time. Pt c/o increased pain with speaking and deep breathing. Pt states feels like unable to take a deep breath. Pt otherwise A&O x4, pt remains on 2L via [employer] at this time. Call bell within reach of patient at this time.

## 2020-07-04 NOTE — ED Notes (Signed)
Medication administered as ordered, blood work collected at this time and sent to lab. Pt c/o intensifying pain under R rib. Dr. Hampton Abbot notified at this time.

## 2020-07-05 ENCOUNTER — Inpatient Hospital Stay: Payer: 59

## 2020-07-05 LAB — CBC WITH DIFFERENTIAL/PLATELET
Abs Immature Granulocytes: 0.03 10*3/uL (ref 0.00–0.07)
Basophils Absolute: 0 10*3/uL (ref 0.0–0.1)
Basophils Relative: 0 %
Eosinophils Absolute: 0.1 10*3/uL (ref 0.0–0.5)
Eosinophils Relative: 1 %
HCT: 34 % — ABNORMAL LOW (ref 36.0–46.0)
Hemoglobin: 11 g/dL — ABNORMAL LOW (ref 12.0–15.0)
Immature Granulocytes: 0 %
Lymphocytes Relative: 26 %
Lymphs Abs: 3.1 10*3/uL (ref 0.7–4.0)
MCH: 29.1 pg (ref 26.0–34.0)
MCHC: 32.4 g/dL (ref 30.0–36.0)
MCV: 89.9 fL (ref 80.0–100.0)
Monocytes Absolute: 0.8 10*3/uL (ref 0.1–1.0)
Monocytes Relative: 7 %
Neutro Abs: 7.8 10*3/uL — ABNORMAL HIGH (ref 1.7–7.7)
Neutrophils Relative %: 66 %
Platelets: 258 10*3/uL (ref 150–400)
RBC: 3.78 MIL/uL — ABNORMAL LOW (ref 3.87–5.11)
RDW: 14.3 % (ref 11.5–15.5)
WBC: 11.9 10*3/uL — ABNORMAL HIGH (ref 4.0–10.5)
nRBC: 0 % (ref 0.0–0.2)

## 2020-07-05 LAB — BASIC METABOLIC PANEL
Anion gap: 11 (ref 5–15)
BUN: 15 mg/dL (ref 6–20)
CO2: 23 mmol/L (ref 22–32)
Calcium: 8.7 mg/dL — ABNORMAL LOW (ref 8.9–10.3)
Chloride: 103 mmol/L (ref 98–111)
Creatinine, Ser: 0.7 mg/dL (ref 0.44–1.00)
GFR, Estimated: >60 mL/min (ref >60)
Glucose, Bld: 97 mg/dL (ref 70–99)
Potassium: 3.4 mmol/L — ABNORMAL LOW (ref 3.5–5.1)
Sodium: 137 mmol/L (ref 135–145)

## 2020-07-05 LAB — MAGNESIUM: Magnesium: 1.9 mg/dL (ref 1.7–2.4)

## 2020-07-05 MED ORDER — HYDROMORPHONE HCL 1 MG/ML IJ SOLN
0.5000 mg | INTRAMUSCULAR | Status: DC | PRN
Start: 2020-07-05 — End: 2020-07-06

## 2020-07-05 MED ORDER — SIMETHICONE 80 MG PO CHEW
160.0000 mg | CHEWABLE_TABLET | Freq: Three times a day (TID) | ORAL | Status: DC | PRN
  Administered 2020-07-05 – 2020-07-06 (×2): 160 mg via ORAL
  Filled 2020-07-05 (×4): qty 2

## 2020-07-05 MED ORDER — IOHEXOL 9 MG/ML PO SOLN
500.0000 mL | ORAL | Status: AC
  Administered 2020-07-05 (×2): 500 mL via ORAL
  Filled 2020-07-05 (×2): qty 500

## 2020-07-05 MED ORDER — SODIUM CHLORIDE 0.9 % IV SOLN
INTRAVENOUS | Status: DC | PRN
  Administered 2020-07-05: 250 mL via INTRAVENOUS

## 2020-07-05 MED ORDER — ACETAMINOPHEN 500 MG PO TABS: 1000.0000 mg | ORAL_TABLET | Freq: Four times a day (QID) | ORAL | Status: DC | PRN

## 2020-07-05 MED ORDER — TRAZODONE HCL 100 MG PO TABS
100.0000 mg | ORAL_TABLET | Freq: Every evening | ORAL | Status: DC | PRN
  Administered 2020-07-05: 100 mg via ORAL
  Filled 2020-07-05 (×2): qty 1

## 2020-07-05 MED ORDER — KCL IN DEXTROSE-NACL 20-5-0.45 MEQ/L-%-% IV SOLN
INTRAVENOUS | Status: DC
  Filled 2020-07-05 (×6): qty 1000

## 2020-07-05 MED ORDER — OXYCODONE HCL 5 MG PO TABS
5.0000 mg | ORAL_TABLET | ORAL | Status: DC | PRN
  Administered 2020-07-05 (×2): 5 mg via ORAL
  Administered 2020-07-06: 10 mg via ORAL
  Filled 2020-07-05 (×2): qty 1
  Filled 2020-07-05: qty 2

## 2020-07-05 MED ORDER — IOHEXOL 300 MG/ML  SOLN
100.0000 mL | Freq: Once | INTRAMUSCULAR | Status: AC | PRN
  Administered 2020-07-05: 100 mL via INTRAVENOUS
  Filled 2020-07-05: qty 100

## 2020-07-05 NOTE — ED Notes (Signed)
Pt ambulated to restroom unassisted and without difficulty

## 2020-07-05 NOTE — ED Notes (Signed)
Advised nurse that pain has ready bed

## 2020-07-05 NOTE — ED Notes (Signed)
Pt states she is more comfortable standing at this time. Pt ambulating with a steady gait. Will continue to monitor.

## 2020-07-05 NOTE — ED Notes (Signed)
Pt states several days ago have her appendix removed. Pt noted to have purple bruising around the umbilicus. Pt resting in bed, asking to stay sitting up.

## 2020-07-05 NOTE — ED Notes (Signed)
Pt repositioned in hospital bed for comfort Pt asked for something for gas pains and gas bubbles  Tech informed RN

## 2020-07-05 NOTE — Progress Notes (Signed)
07/05/2020  Subjective: Patient is POD#3 s/p laparoscopic appendectomy with Dr. Celine Ahr.  No acute events overnight.  Patient's pain continues to improve, and she's been off [employer] support.  Her WBC continues to improve and is down to 11.9 this morning.  Repeat CT scan, with oral and IV contrast, was done this morning.  I have personally viewed the images and discussed them with the patient.  Overall, contrast flows down to the transverse colon without any contrast extravasation to suggest perforation or leak.  There are no inflammatory changes visible, and the amount of pneumoperitoneum has decreased.  Vital signs: Temp:  [99 F (37.2 C)] 99 F (37.2 C) (01/29 2222) Pulse Rate:  [82-105] 105 (01/30 0534) Resp:  [15-29] 22 (01/30 0534) BP: (108-139)/(65-89) 139/72 (01/30 0534) SpO2:  [92 %-98 %] 93 % (01/30 0534)   Intake/Output: 01/29 0701 - 01/30 0700 In: 1300.3 [I.V.:250.3; IV Piggyback:1050] Out: 900 [Urine:900]    Physical Exam: Constitutional: No acute distress Abdomen:  Soft, improved distention, only with some soreness to palpation.  All incisions are clean, dry, intact, with some ecchymosis but no erythema and no induration.  Labs:  Recent Labs    07/04/20 1910 07/05/20 0336  WBC 13.7* 11.9*  HGB 11.0* 11.0*  HCT 33.9* 34.0*  PLT 296 258   Recent Labs    07/04/20 1150 07/05/20 0336  NA 138 137  K 3.5 3.4*  CL 104 103  CO2 23 23  GLUCOSE 97 97  BUN 13 15  CREATININE 0.68 0.70  CALCIUM 8.6* 8.7*   No results for input(s): LABPROT, INR in the last 72 hours.  Imaging: CT ABDOMEN PELVIS W CONTRAST  Result Date: 07/05/2020 CLINICAL DATA:  Peritonitis or perforation suspected. Status post laparoscopic appendectomy on 01/27 22. Significant right upper quadrant pain and pneumoperitoneum on CT on 01/29. EXAM: CT ABDOMEN AND PELVIS WITH CONTRAST TECHNIQUE: Multidetector CT imaging of the abdomen and pelvis was performed using the standard protocol following bolus  administration of intravenous contrast. CONTRAST:  170mL OMNIPAQUE IOHEXOL 300 MG/ML  SOLN COMPARISON:  None. FINDINGS: Lower chest: Scattered patchy ground-glass airspace opacities. Linear atelectasis. Hepatobiliary: The liver is enlarged measuring up to 20 cm. No focal liver abnormality. Calcified gallstones measuring up to at least 2 cm within the gallbladder lumen. No gallbladder wall thickening or pericholecystic fluid. No biliary dilatation. Pancreas: No focal lesion. Normal pancreatic contour. No surrounding inflammatory changes. No main pancreatic ductal dilatation. Spleen: Normal in size without focal abnormality. Adrenals/Urinary Tract: No adrenal nodule bilaterally. Bilateral kidneys enhance symmetrically. No hydronephrosis. No hydroureter. The urinary bladder is unremarkable. On delayed imaging, there is no urothelial wall thickening and there are no filling defects in the opacified portions of the bilateral collecting systems or ureters. No extravasation of excreted intravenous contrast from the proximal bilateral urinary collecting systems. Stomach/Bowel: PO contrast reaches the mid transverse colon. No extravasation of PO contrast from the lumen of the small or large bowel. Stomach is within normal limits. No evidence of bowel wall thickening or dilatation. No pneumatosis. Status post appendectomy with no right lower quadrant inflammatory change. Vascular/Lymphatic: No abdominal aorta or iliac aneurysm. No abdominal, pelvic, or inguinal lymphadenopathy. Reproductive: Uterus and bilateral adnexa are unremarkable. Other: Stable to possibly slightly decreased moderate volume pneumoperitoneum. No free intraperitoneal flu fluid. No organized fluid collection. No inflammatory changes or fat stranding within the abdomen and pelvis. Musculoskeletal: Interval increase in Subcutaneus soft tissue emphysema along the umbilical laparoscopic site likely postsurgical. No suspicious lytic or blastic osseous  lesions.  No acute displaced fracture. IMPRESSION: 1. Stable to possibly slightly decreased moderate volume pneumoperitoneum in a patient status post laparoscopic appendectomy. No extravasation of PO contrast or inflammatory changes within the abdomen/pelvis to suggest stable/suture dehiscence or bowel perforation. 2. Scattered patchy ground-glass airspace opacities. Could represent infection/inflammation. COVID-19 infection not excluded. 3. Cholelithiasis with no acute cholecystitis. 4. Hepatomegaly. Electronically Signed   By: Iven Finn M.D.   On: 07/05/2020 05:31    Assessment/Plan: This is a 50 y.o. female s/p laparoscopic appendectomy, presenting on POD#2 with significant RUQ pain, pneumoperitoneum, and WBC of 20.  --Discussed with the patient that the findings on her CT scan this morning are reassuring, with less suspicion for a missed injury to a new perforation.  The staple line is intact.  There is no fluid or abscess, and no fat stranding or bowel wall thickening.  The amount of free air has decreased, and there is some increase in air in the subcutaneous tissue, likely diffusing through the incisions from her surgery.  CT again shows changes in bilateral lung bases which could be representing a pneumonia.  COVID-19 test negative x 2. --Will start patient on clear liquids today, decrease IV fluids, continue IV Zosyn.  Advance to full liquids for dinner if doing well throughout the day. --If continues to improve anticipate d/c home tomorrow.   Melvyn Neth, [facility]

## 2020-07-05 NOTE — ED Notes (Signed)
Dinner tray given

## 2020-07-05 NOTE — ED Notes (Signed)
Message sent to pharmacy for missing dose of ordered fluids. Pt in NAD at this time. VSS. Awaiting further orders. Will continue to monitor.

## 2020-07-05 NOTE — ED Notes (Signed)
Spoke with Ovid Curd, pharmacist who stated that the zosyn formulation at this facility is compatible with LR.

## 2020-07-05 NOTE — Progress Notes (Signed)
Pt arrived from ED to room 350. A&Ox4, steady gait. Pt requesting to immediately take a shower, IV wrapped, supplies provided. Refused to order full liquids for dinner, educated we have options she can try overnight if wanted.

## 2020-07-05 NOTE — ED Notes (Signed)
IVF paused so pt could be transported by ED tech. Skyler, RN on floor notified and stated that was okay.

## 2020-07-05 NOTE — ED Notes (Signed)
Pt to CT. Pt in NAD at this time. VSS.  

## 2020-07-06 LAB — CBC
HCT: 30.9 % — ABNORMAL LOW (ref 36.0–46.0)
Hemoglobin: 10.1 g/dL — ABNORMAL LOW (ref 12.0–15.0)
MCH: 29.1 pg (ref 26.0–34.0)
MCHC: 32.7 g/dL (ref 30.0–36.0)
MCV: 89 fL (ref 80.0–100.0)
Platelets: 276 10*3/uL (ref 150–400)
RBC: 3.47 MIL/uL — ABNORMAL LOW (ref 3.87–5.11)
RDW: 13.9 % (ref 11.5–15.5)
WBC: 10 10*3/uL (ref 4.0–10.5)
nRBC: 0 % (ref 0.0–0.2)

## 2020-07-06 LAB — BASIC METABOLIC PANEL
Anion gap: 8 (ref 5–15)
BUN: 8 mg/dL (ref 6–20)
CO2: 24 mmol/L (ref 22–32)
Calcium: 8.5 mg/dL — ABNORMAL LOW (ref 8.9–10.3)
Chloride: 106 mmol/L (ref 98–111)
Creatinine, Ser: 0.82 mg/dL (ref 0.44–1.00)
GFR, Estimated: >60 mL/min (ref >60)
Glucose, Bld: 128 mg/dL — ABNORMAL HIGH (ref 70–99)
Potassium: 3.3 mmol/L — ABNORMAL LOW (ref 3.5–5.1)
Sodium: 138 mmol/L (ref 135–145)

## 2020-07-06 LAB — SURGICAL PATHOLOGY

## 2020-07-06 MED ORDER — POLYETHYLENE GLYCOL 3350 17 G PO PACK: 17.0000 g | PACK | Freq: Every day | ORAL | 0 refills | Status: DC | PRN

## 2020-07-06 MED ORDER — AMOXICILLIN-POT CLAVULANATE 875-125 MG PO TABS: 1.0000 | ORAL_TABLET | Freq: Two times a day (BID) | ORAL | 0 refills | Status: AC

## 2020-07-06 NOTE — Consult Note (Signed)
Pharmacy Antibiotic Note  [name] is a 50 y.o. female admitted on 07/04/2020 with sepsis secondary to acute appendicitis.  Pharmacy has been consulted for Zosyn dosing.  Plan: Day 3 of antibiotics  Zosyn 3.375g IV q8h (4 hour infusion).  Height: 5\' 2"  (157.5 cm) Weight: 95.3 kg (210 lb) IBW/kg (Calculated) : 50.1  Temp (24hrs), Avg:98.7 F (37.1 C), Min:97.9 F (36.6 C), Max:99.7 F (37.6 C)  Recent Labs  Lab 07/02/20 0657 07/04/20 0025 07/04/20 0604 07/04/20 1150 07/04/20 1910 07/05/20 0336 07/06/20 0638  WBC 8.9 20.1*  --  15.9* 13.7* 11.9* 10.0  CREATININE 0.69 0.86  --  0.68  --  0.70 0.82  LATICACIDVEN  --   --  1.1 0.8 0.9  --   --     Estimated Creatinine Clearance: 89.4 mL/min (by C-G formula based on SCr of 0.82 mg/dL).    No Known Allergies  Antimicrobials this admission: Zosyn 1/29 >>     Dose adjustments this admission: n/a  Microbiology results: 1/29 BCx: pending  COVID NEG  Thank you for allowing pharmacy to be a part of this patient's care.  [name] Dupre, PharmD, BCPS Clinical Pharmacist 07/06/2020 9:52 AM

## 2020-07-06 NOTE — Discharge Summary (Addendum)
[facility] SURGICAL DISCHARGE SUMMARY  Patient ID: [name] MRN: [id number] DOB/AGE: 50-Mar-1972 50 y.o.  Admit date: 07/04/2020 Discharge date: 07/06/2020  Discharge Diagnoses Patient Active Problem List   Diagnosis Date Noted   Abdominal pain 07/04/2020   Pneumoperitoneum    Acute appendicitis 07/02/2020    Consultants None  Procedures None   HPI: [name] is a 50 y.o. female s/p laparoscopic appendectomy with Dr. Celine Ahr on 07/02/20.  She was discharged the same day.  She reports that yesterday morning she started having soreness in the RUQ and towards the right shoulder.  Later on in the evening, while she was getting up, she felt a "ripping" sensation in the RUQ and felt that the pain had worsened. She had [place] food for dinner and her pain got worse after that as well.  She also felt that there was more abdominal distention and difficulty breathing.  She presented to the ER last night.  Her O2 saturation was in the upper 80s and recovered well with 2L [place].  Laboratory workup initially showed an elevated WBC of 20.1, with normal electrolytes and LFTs.  CXR showed air under the diaphragm and also bilateral basilar atelectasis.  CTA chest as well as CT abdomen/pelvis was obtained to evaluate for PE and for abdominal etiology.  There was no PE, but she again had bilateral ground glass opacities, and also a moderate amount of pneumoperitoneum.  I have personally viewed all the images.  Overall, the appendix staple line is intact and goes retrocecal which is how the appendix was prior to surgery.  There is no fat stranding anywhere, and there is no bowel wall thickening of stomach, small , or large instestine.  There is no significant amount of free fluid either.  In light of this, it is suspected per radiology that the free air is residual from her laparoscopic appendectomy.  Further workup in the ED showed a normal lactic acid of 1.1 and procalcitonin of <0.10.   COVID-19 test was negative x 2.  Hospital Course: She was admitted to the general surgery service for observation and pain control. Her pain improved significantly over the course of her hospitalization. Her leukocytosis additionally resolved over the course of her admission and was in normal limits at 10.0K on day of discharge. Advancement of patient's diet (to full liquids, she declined regular diet) and ambulation were well-tolerated. The remainder of patient's hospital course was essentially unremarkable, and discharge planning was initiated accordingly with patient safely able to be discharged home with appropriate discharge instructions, antibiotics (augmentin x5 days to complete 7 days for PNA), pain control, and outpatient follow-up after all of her questions were answered to her expressed satisfaction.   Discharge Condition: Good   Physical Examination:  Constitutional: Well appearing female, NAD Pulmonary: Normal effort, no respiratory distress Gastrointestinal: Soft, incisional soreness, non-distended, no rebound/guarding Skin: Laparoscopic incisions are CDI with steri-strips, no erythema or drainage    Allergies as of 07/06/2020   No Known Allergies      Medication List     TAKE these medications    acetaminophen 500 MG tablet Commonly known as: TYLENOL Take 2 tablets (1,000 mg total) by mouth every 6 (six) hours.   amoxicillin-clavulanate 875-125 MG tablet Commonly known as: Augmentin Take 1 tablet by mouth 2 (two) times daily for 5 days.   ibuprofen 800 MG tablet Commonly known as: ADVIL Take 1 tablet (800 mg total) by mouth every 8 (eight) hours as needed.  ondansetron 4 MG disintegrating tablet Commonly known as: ZOFRAN-ODT Take 1 tablet (4 mg total) by mouth every 6 (six) hours as needed for nausea.   oxyCODONE 5 MG immediate release tablet Commonly known as: Oxy IR/ROXICODONE Take 1 tablet (5 mg total) by mouth every 6 (six) hours as needed for severe  pain.   polyethylene glycol 17 g packet Commonly known as: MIRALAX / GLYCOLAX Take 17 g by mouth daily as needed for mild constipation.   traZODone 100 MG tablet Commonly known as: DESYREL Take 1 tablet by mouth at bedtime as needed.          Follow-up Information     Tylene Fantasia, PA-C. Go on 07/16/2020.   Specialty: Physician Assistant Why: go to scheduled appointment on 02/10, please call if questions/concerns before that time Contact information: [address] [place] [address] [phone]                  Time spent on discharge management including discussion of hospital course, clinical condition, outpatient instructions, prescriptions, and follow up with the patient and members of the medical team: >30 minutes  -- Edison Simon , PA-C [facility]  07/06/2020, 9:25 AM [phone] M-F: 7am - 4pm  I saw and evaluated the patient.  I agree with the above documentation, exam, and plan, which I have edited where appropriate. Fredirick Maudlin  9:42 AM

## 2020-07-06 NOTE — Progress Notes (Signed)
Pt states she feels much better this morning.  Only c/o pain with activity- has not required PRN pain med since 0132. No BM since before surgery, pt concerned.  Miralax given at 0559, requested hot coffee as well to help have BM. Encouraged to order breakfast- now on full liquid diet.

## 2020-07-06 NOTE — Progress Notes (Signed)
Patient discharged home with husband. Discharge instructions and prescriptions given and reviewed with patient. Patient verbalized understanding. Will be escorted out by auxillary.

## 2020-07-07 ENCOUNTER — Encounter: Payer: Self-pay | Admitting: *Deleted

## 2020-07-07 ENCOUNTER — Other Ambulatory Visit: Payer: Self-pay | Admitting: *Deleted

## 2020-07-07 ENCOUNTER — Telehealth: Payer: Self-pay

## 2020-07-07 DIAGNOSIS — Z9049 Acquired absence of other specified parts of digestive tract: Secondary | ICD-10-CM

## 2020-07-07 NOTE — Telephone Encounter (Signed)
Transition Care Management Follow-up Telephone Call  Date of discharge and from where: 07/06/20 from [facility]  How have you been since you were released from the hospital? Patient states,"The mornings are bad when I wake because pain medications wear off. The gas in my stomach makes gives a little shortness of breath when I get excited. Spirometer in use. Deep breathing is improved. Soft stool. R leg is just beginning to swell a little." Notes she called Cardiology and scheduled appointment. Denies n/v/d, headache, chest pain, dizziness and all other symptoms.   Any questions or concerns? No  Items Reviewed:  Did the pt receive and understand the discharge instructions provided? Yes   Medications obtained and verified? Yes , taking all medications as directed.   Any new allergies since your discharge? No   Dietary orders reviewed? Full liquid  Do you have support at home? Yes   Home Care and Equipment/Supplies: Were home health services ordered? No  Functional Questionnaire: (I = Independent and D = Dependent) ADLs: I  Bathing/Dressing- I  Meal Prep- I  Eating- I  Maintaining continence- I  Transferring/Ambulation- I  Managing Meds- I  Follow up appointments reviewed:   PCP Hospital f/u appt confirmed? Yes  Scheduled to see Mable Paris on 07/10/20 @ 3:30.  [facility] f/u appt confirmed? Yes  Scheduled to see Post Op 07/16/20. Notes Cardiology 07/09/20 @ 9:20.    Are transportation arrangements needed? No   If their condition worsens, is the pt aware to call PCP or go to the Emergency Dept.? Yes  Was the patient provided with contact information for the PCP's office or ED? Yes  Was to pt encouraged to call back with questions or concerns? Yes

## 2020-07-07 NOTE — Patient Outreach (Signed)
[facility]) Care Management  07/07/2020  DEKOTA KIRLIN 1971-05-06 [id number]   Transition of care call/case closure   Referral received:07/06/20 Initial outreach:07/07/20 Insurance: [place] [employer]    Subjective: Initial successful telephone call to patient's preferred number in order to complete transition of care assessment; 2 HIPAA identifiers verified. Explained purpose of call and completed transition of care assessment. Patient discussed noting my name on her care team in mychart. Kennedee states that she is feeling better right now. She discussed feeling worse at night not sleeping well less than 2 hours, at times  she states difficulty in getting settled down usually takes oxycodone at night.  She discussed having some  problems  with shortness of breath since her appendectomy and having to return to hospital the next day due to shortness of breath and abdominal pain. She discussed being treated for pneumonia with po antibiotics , she discussed being explained having air under diaphragm area. Giara denies having fever cough . She reports having some swelling  in right ankle area and in fingers, denies pain.  She reports abdominal incisional area  are unremarkable, she states surgical pain well managed with prescribed medication. She discussed taking it slow with her diet mostly full liquid soft, such grits she plan to add oatmeal to meal on today, tolerating liquids well denies nausea. She reports having bowel movement yesterday and today and denies bladder problems. Reinforced continued use of incentive spirometry and increasing mobility in home as tolerated.   Spouse is  assisting with her  recovery.   Reviewed accessing the following [place] Benefits : She denies  any ongoing health issues at this time but has initial follow up with cardiology regarding echo and concern for possible heart failure, she states she  does not need a referral to one of the [facility]  chronic disease management programs at this time.  Reviewed with patient [place] benefits or smoking cessation as well as 1-800 - quit now. She discussed desire to smoke to due current problems  She is unsure if she has the  the hospital indemnity plan, provided contact number to [employer] to file claim if needed, [phone]. Provided patient with contact number to [place] office if further questions regarding benefits.  She does not use  a [facility] outpatient pharmacy.     Objective:  Mrs. Herbert Pun  was hospitalized at [facility]  1/30-1/31 for Abdominal pain Pneumoperitoneum. She had recent hospital ED Admission acute appendicitis and s/p appendectomy 1/27-1/28/22.  PMHX: restless leg syndrome  She  was discharged to home on 07/06/20  without the need for home health services or DME.   Assessment:  Patient voices good understanding of all discharge instructions.  See transition of care flowsheet for assessment details.   Plan:  Reviewed hospital discharge diagnosis of Abdominal pain, pneumoperitoneum   and discharge treatment plan using hospital discharge instructions, assessing medication adherence, reviewing problems requiring provider notification, and discussing the importance of follow up with surgeon, primary care provider and/or specialists as directed.  Reviewed [place] healthy lifestyle program information to receive discounted premium for  2023   Step 1: Get  your annual physical  Step 2: Complete your health assessment  Step 3:Identify your current health status and complete the corresponding action step between June 06, 2020 and February 04, 2021.    No ongoing care management needs identified so will close case to [place] [facility] services and route successful outreach  letter with [place] Management pamphlet and 24 Hour Nurse Line Magnet to [place] [employer] clinical pool to be  mailed to patient's home address.  Thanked patient for their services to [facility].   Joylene Draft, RN, BSN  [place] Management Coordinator  [phone]- Mobile [phone]- Toll Free Main Office

## 2020-07-07 NOTE — Telephone Encounter (Signed)
Patient called in and spoke with Caryl Pina in the front office and scheduled a New patient appointment for 07/09/20.

## 2020-07-08 ENCOUNTER — Telehealth: Payer: Self-pay | Admitting: *Deleted

## 2020-07-08 NOTE — Telephone Encounter (Signed)
Faxed FMLA to [employer] at [phone] 

## 2020-07-09 ENCOUNTER — Other Ambulatory Visit: Payer: Self-pay

## 2020-07-09 ENCOUNTER — Encounter: Payer: Self-pay | Admitting: Cardiology

## 2020-07-09 ENCOUNTER — Ambulatory Visit (INDEPENDENT_AMBULATORY_CARE_PROVIDER_SITE_OTHER): Payer: 59 | Admitting: Cardiology

## 2020-07-09 VITALS — BP 138/70 | HR 69 | Ht 62.5 in | Wt 215.4 lb

## 2020-07-09 DIAGNOSIS — R931 Abnormal findings on diagnostic imaging of heart and coronary circulation: Secondary | ICD-10-CM

## 2020-07-09 DIAGNOSIS — Z6838 Body mass index (BMI) 38.0-38.9, adult: Secondary | ICD-10-CM

## 2020-07-09 LAB — CULTURE, BLOOD (ROUTINE X 2)
Culture: NO GROWTH
Culture: NO GROWTH
Special Requests: ADEQUATE
Special Requests: ADEQUATE

## 2020-07-09 NOTE — Progress Notes (Signed)
Cardiology Office Note:    Date:  07/09/2020   ID:  [name], DOB 01/01/1971, MRN [id number]  PCP:  Burnard Hawthorne, [place] Cardiologist:  No primary care provider on file.  [facility] Electrophysiologist:  None   Referring MD: Burnard Hawthorne, FNP   Chief Complaint  Patient presents with  . New Patient (Initial Visit)    CHF; Medications reviewed by the patient verbally.    [name] is a 50 y.o. female who is being seen today for the evaluation of CHF at the request of Vidal Schwalbe Yvetta Coder, FNP.   History of Present Illness:    [name] is a 50 y.o. female with a hx of restless leg syndrome, obesity who presents due to concerns for CHF.  Patient recently admitted for an appendectomy. Complaint of some shortness of breath, echocardiogram obtained 05/2020 showed normal systolic function, impaired relaxation, no significant valvular abnormalities. Patient has been told in the past she has CHF many years ago. She walks without chest pain or shortness of breath. Endorses some deconditioning when she overexerts herself. Is working on losing some weight. Denies any family history of CAD or heart failure. Has some edema after her surgery which is currently resolved. Her only complaint currently is some abdominal soreness secondary to appendectomy.  Past Medical History:  Diagnosis Date  . Akathisia 12/17/2014  . Bipolar affective ([place])   . Restless leg syndrome   . Schizoaffective disorder ([place])   . Schizoaffective disorder [facility])     Past Surgical History:  Procedure Laterality Date  . CESAREAN SECTION    . LAPAROSCOPIC APPENDECTOMY N/A 07/02/2020   Procedure: APPENDECTOMY LAPAROSCOPIC;  Surgeon: Fredirick Maudlin, MD;  Location: [facility] ORS;  Service: General;  Laterality: N/A;    Current Medications: Current Meds  Medication Sig  . acetaminophen (TYLENOL) 500 MG tablet Take 2 tablets (1,000 mg total) by mouth every 6 (six) hours.  Marland Kitchen  amoxicillin-clavulanate (AUGMENTIN) 875-125 MG tablet Take 1 tablet by mouth 2 (two) times daily for 5 days.  Marland Kitchen ibuprofen (ADVIL) 800 MG tablet Take 1 tablet (800 mg total) by mouth every 8 (eight) hours as needed.  . ondansetron (ZOFRAN-ODT) 4 MG disintegrating tablet Take 1 tablet (4 mg total) by mouth every 6 (six) hours as needed for nausea.  Marland Kitchen oxyCODONE (OXY IR/ROXICODONE) 5 MG immediate release tablet Take 1 tablet (5 mg total) by mouth every 6 (six) hours as needed for severe pain.  . polyethylene glycol (MIRALAX / GLYCOLAX) 17 g packet Take 17 g by mouth daily as needed for mild constipation.  . traZODone (DESYREL) 100 MG tablet Take 1 tablet by mouth at bedtime as needed.     Allergies:   Patient has no known allergies.   Social History   Socioeconomic History  . Marital status: Married    Spouse name: [name]  . Number of children: 2  . Years of education: 59  . Highest education level: Some college, no degree  Occupational History  . Not on file  Tobacco Use  . Smoking status: Current Every Day Smoker    Packs/day: 0.50    Years: 10.00    Pack years: 5.00    Types: Cigarettes  . Smokeless tobacco: Never Used  Vaping Use  . Vaping Use: Former  Substance and Sexual Activity  . Alcohol use: No  . Drug use: No  . Sexual activity: Yes  Other Topics Concern  . 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  ROS:   Please see the history of present illness.     All other systems reviewed and are negative.  EKGs/Labs/Other Studies Reviewed:    The following  studies were reviewed today:   EKG:  EKG is  ordered today.  The ekg ordered today demonstrates extremity  Recent Labs: 03/27/2020: TSH 3.48 07/04/2020: ALT 24 07/05/2020: Magnesium 1.9 07/06/2020: BUN 8; Creatinine, Ser 0.82; Hemoglobin 10.1; Platelets 276; Potassium 3.3; Sodium 138  Recent Lipid Panel    Component Value Date/Time   CHOL 188 03/27/2020 0921   TRIG 170.0 (H) 03/27/2020 0921   HDL 49.00 03/27/2020 0921   CHOLHDL 4 03/27/2020 0921   VLDL 34.0 03/27/2020 0921   LDLCALC 105 (H) 03/27/2020 0921     Risk Assessment/Calculations:      Physical Exam:    VS:  BP 138/70 (BP Location: Right Arm, Patient Position: Sitting, Cuff Size: Large)   Pulse 69   Ht 5' 2.5" (1.588 m)   Wt 215 lb 6 oz (97.7 kg)   SpO2 98%   BMI 38.76 kg/m     Wt Readings from Last 3 Encounters:  07/09/20 215 lb 6 oz (97.7 kg)  07/04/20 210 lb (95.3 kg)  07/02/20 209 lb (94.8 kg)     GEN:  Well nourished, well developed in no acute distress HEENT: Normal NECK: No JVD; No carotid bruits LYMPHATICS: No lymphadenopathy CARDIAC: RRR, no murmurs, rubs, gallops RESPIRATORY:  Clear to auscultation without rales, wheezing or rhonchi  ABDOMEN: Soft, mildly-tender, non-distended MUSCULOSKELETAL:  No edema; No deformity  SKIN: Warm and dry NEUROLOGIC:  Alert and oriented x 3 PSYCHIATRIC:  Normal affect   ASSESSMENT:    1. Abnormal echocardiogram   2. BMI 38.0-38.9,adult    PLAN:    In order of problems listed above:  1. Patient with impaired relaxation/grade 1 diastolic dysfunction on echocardiogram. Filling pressures, systolic function, wall thickness was normal. No significant valvular abnormalities. She denies clinical syndrome for CHF. Patient does not have CHF as per last echocardiogram and lack of clinical symptoms. Results made aware to patient and reassured. Impaired relaxation probably from obesity. 2. Obesity, low-calorie diet, exercise, weight loss advised.  Follow-up as  needed      Medication Adjustments/Labs and Tests Ordered: Current medicines are reviewed at length with the patient today.  Concerns regarding medicines are outlined above.  Orders Placed This Encounter  Procedures  . EKG 12-Lead   No orders of the defined types were placed in this encounter.   Patient Instructions  Medication Instructions:  Your physician recommends that you continue on your current medications as directed. Please refer to the Current Medication list given to you today.  *If you need a refill on your cardiac medications before your next appointment, please call your pharmacy*   Lab Work: None ordered If you have labs (blood work) drawn today and your tests are completely normal, you will receive your results only by: Marland Kitchen MyChart Message (if you have MyChart)  OR . A paper copy in the mail If you have any lab test that is abnormal or we need to change your treatment, we will call you to review the results.   Testing/Procedures: None ordered   Follow-Up: At [facility], you and your health needs are our priority.  As part of our continuing mission to provide you with exceptional heart care, we have created designated Provider Care Teams.  These Care Teams include your primary Cardiologist (physician) and Advanced Practice Providers (APPs -  Physician Assistants and Nurse Practitioners) who all work together to provide you with the care you need, when you need it.  We recommend signing up for the patient portal called "MyChart".  Sign up information is provided on this After Visit Summary.  MyChart is used to connect with patients for Virtual Visits (Telemedicine).  Patients are able to view lab/test results, encounter notes, upcoming appointments, etc.  Non-urgent messages can be sent to your provider as well.   To learn more about what you can do with MyChart, go to [email or web address].    Your next appointment:   Follow up as needed   The format for  your next appointment:   In Person  Provider:   Kate Sable, MD   Other Instructions      Signed, Kate Sable, MD  07/09/2020 1:09 PM    [place]

## 2020-07-09 NOTE — Patient Instructions (Signed)

## 2020-07-10 ENCOUNTER — Telehealth: Payer: Self-pay | Admitting: Family

## 2020-07-10 ENCOUNTER — Ambulatory Visit (INDEPENDENT_AMBULATORY_CARE_PROVIDER_SITE_OTHER): Payer: 59 | Admitting: Family

## 2020-07-10 ENCOUNTER — Inpatient Hospital Stay: Payer: 59 | Admitting: Family

## 2020-07-10 ENCOUNTER — Encounter: Payer: Self-pay | Admitting: Family

## 2020-07-10 ENCOUNTER — Ambulatory Visit: Payer: 59 | Admitting: Cardiology

## 2020-07-10 VITALS — BP 130/80 | HR 91 | Temp 98.3°F | Ht 62.0 in | Wt 212.0 lb

## 2020-07-10 DIAGNOSIS — K668 Other specified disorders of peritoneum: Secondary | ICD-10-CM | POA: Diagnosis not present

## 2020-07-10 DIAGNOSIS — R160 Hepatomegaly, not elsewhere classified: Secondary | ICD-10-CM

## 2020-07-10 DIAGNOSIS — I509 Heart failure, unspecified: Secondary | ICD-10-CM

## 2020-07-10 LAB — BASIC METABOLIC PANEL
BUN: 9 mg/dL (ref 6–23)
CO2: 27 mEq/L (ref 19–32)
Calcium: 9.3 mg/dL (ref 8.4–10.5)
Chloride: 105 mEq/L (ref 96–112)
Creatinine, Ser: 0.67 mg/dL (ref 0.40–1.20)
GFR: 102.26 mL/min (ref >60.00)
Glucose, Bld: 87 mg/dL (ref 70–99)
Potassium: 4.1 mEq/L (ref 3.5–5.1)
Sodium: 140 mEq/L (ref 135–145)

## 2020-07-10 NOTE — Progress Notes (Signed)
Subjective:    Patient ID: [name], female    DOB: [birth year], 50 y.o.   MRN: [id number]  CC: [name] is a 50 y.o. female who presents today for follow up.   HPI: Follow up hospitalization  Abdominal pain has largely resolved. She has feels 'discomfort' in right lower and left side of abdomen on occasional however she no longer requires pain medication.  SOB has resolved. She can take deep breaths without pain.   Her greatest concern today is that she feels she feels and hears fluid in her abdomen when she lays to the side. This has improved. She felt this 5 days at time of hospital discharge.  Complaints of  loose non bloody brown stools for past 5 days. 1-2 episodes per day. Discharged 5 days ago on miralax. Stopped oxycodone and miralax 2 days ago. Passing gas.   Has resumed eating regular foods, such as fried chicken, without abdominal pain. No vomiting, fever.   Incision is closed. No purulent discharge.   Right leg will swell at night, 'not bad.' No swelling today. She has had leg swelling in the past.    Scheduled to see General surgery for Post Op 07/16/20.    Originally presented to ED 07/02/20 and diagnosed with acute appendicitis; discharged same day Presented to ED 07/04/20 and discharged 07/06/20 S/p laparoscopic appendectomy with Dr cannon 07/02/20 Later than day she started to have soreness RUQ and 'ripping sensation'  In RUQ. Leukocytosis, oxygen desaturation. CTA negative for PE. CT a /p showed gallstone,  Enlarged liver, Scattered ground glass airspace. Radiology suspected free air residual from appendectomy. Hospitalized for observation and pain control Leukocytosis resolved 4 days ago Anemia  Seen yesterday by Charlestine Night.  HISTORY:  Past Medical History:  Diagnosis Date  . Akathisia 12/17/2014  . Bipolar affective ([place])   . Restless leg syndrome   . Schizoaffective disorder ([place])   . Schizoaffective disorder [facility])    Past Surgical History:   Procedure Laterality Date  . CESAREAN SECTION    . LAPAROSCOPIC APPENDECTOMY N/A 07/02/2020   Procedure: APPENDECTOMY LAPAROSCOPIC;  Surgeon: Fredirick Maudlin, MD;  Location: [facility] ORS;  Service: General;  Laterality: N/A;   Family History  Problem Relation Age of Onset  . Drug abuse Maternal Aunt     Allergies: Patient has no known allergies. Current Outpatient Medications on File Prior to Visit  Medication Sig Dispense Refill  . acetaminophen (TYLENOL) 500 MG tablet Take 2 tablets (1,000 mg total) by mouth every 6 (six) hours. 30 tablet 0  . amoxicillin-clavulanate (AUGMENTIN) 875-125 MG tablet Take 1 tablet by mouth 2 (two) times daily for 5 days. 10 tablet 0  . ibuprofen (ADVIL) 800 MG tablet Take 1 tablet (800 mg total) by mouth every 8 (eight) hours as needed. 30 tablet 0  . ondansetron (ZOFRAN-ODT) 4 MG disintegrating tablet Take 1 tablet (4 mg total) by mouth every 6 (six) hours as needed for nausea. 20 tablet 0  . oxyCODONE (OXY IR/ROXICODONE) 5 MG immediate release tablet Take 1 tablet (5 mg total) by mouth every 6 (six) hours as needed for severe pain. 15 tablet 0  . polyethylene glycol (MIRALAX / GLYCOLAX) 17 g packet Take 17 g by mouth daily as needed for mild constipation. 14 each 0  . traZODone (DESYREL) 100 MG tablet Take 1 tablet by mouth at bedtime as needed.     No current facility-administered medications on file prior to visit.    Social History  Tobacco Use  . Smoking status: Current Every Day Smoker    Packs/day: 0.50    Years: 10.00    Pack years: 5.00    Types: Cigarettes  . Smokeless tobacco: Never Used  Vaping Use  . Vaping Use: Former  Substance Use Topics  . Alcohol use: No  . Drug use: No    Review of Systems  Constitutional: Negative for chills and fever.  Respiratory: Negative for cough and shortness of breath.   Cardiovascular: Negative for chest pain, palpitations and leg swelling.  Gastrointestinal: Positive for abdominal pain and  diarrhea. Negative for abdominal distention, anal bleeding, constipation, nausea and vomiting.  Genitourinary: Negative for difficulty urinating and urgency.  Skin: Positive for wound (surgical).      Objective:    BP 130/80   Pulse 91   Temp 98.3 F (36.8 C) (Oral)   Ht 5\' 2"  (1.575 m)   Wt 212 lb (96.2 kg)   BMI 38.78 kg/m  BP Readings from Last 3 Encounters:  07/10/20 130/80  07/09/20 138/70  07/06/20 135/65   Wt Readings from Last 3 Encounters:  07/10/20 212 lb (96.2 kg)  07/09/20 215 lb 6 oz (97.7 kg)  07/04/20 210 lb (95.3 kg)    Physical Exam Vitals reviewed.  Constitutional:      Appearance: Normal appearance. She is well-developed and well-nourished.  Eyes:     Conjunctiva/sclera: Conjunctivae normal.  Cardiovascular:     Rate and Rhythm: Normal rate and regular rhythm.     Pulses: Normal pulses.     Heart sounds: Normal heart sounds.  Pulmonary:     Effort: Pulmonary effort is normal.     Breath sounds: Normal breath sounds. No wheezing, rhonchi or rales.  Abdominal:     General: Bowel sounds are normal. There is no distension or ascites.     Palpations: Abdomen is soft. Abdomen is not rigid. There is no fluid wave or mass.     Tenderness: There is no abdominal tenderness. There is no CVA tenderness, guarding or rebound.       Comments: Firm, obese. Surgical incision is well approximated, intact. No purulence, erythema.   Musculoskeletal:     Right lower leg: No edema.     Left lower leg: No edema.  Skin:    General: Skin is warm and dry.  Neurological:     Mental Status: She is alert.  Psychiatric:        Mood and Affect: Mood and affect normal.        Speech: Speech normal.        Behavior: Behavior normal.        Thought Content: Thought content normal.        Assessment & Plan:   Problem List Items Addressed This Visit      Cardiovascular and Mediastinum   RESOLVED: Congestive heart failure [facility])    She does not have CHF; consulted  with Cardiology Dr Charlestine Night 07/2020. Delete problem.         Other   Pneumoperitoneum - Primary    Hospitalization course reviewed with patient. SOB resolved. No pain with deep inspiration. Abdominal pain nearly resolved. Discussed most likely miralax culprit for loose stools and to advance diet, slowly, with bland foods and certainly let me know if loose  doesn't resolve. Benign abdominal exam and obvious signs of fluid, edema. Surgical site intact, and no signs to suggest infection. I have sent a message to Dr Celine Ahr regarding this symptom. Pending [place]  RUQ due to enlarged liver incidentally found.       Relevant Orders   Basic metabolic panel    Other Visit Diagnoses    Enlarged liver       Relevant Orders   US ABDOMEN LIMITED RUQ (LIVER/GB)       I am having Ambri P. Didio maintain her traZODone, acetaminophen, ondansetron, ibuprofen, oxyCODONE, polyethylene glycol, and amoxicillin-clavulanate.   No orders of the defined types were placed in this encounter.   Return precautions given.   Risks, benefits, and alternatives of the medications and treatment plan prescribed today were discussed, and patient expressed understanding.   Education regarding symptom management and diagnosis given to patient on AVS.  Continue to follow with Burnard Hawthorne, FNP for routine health maintenance.   [name] and I agreed with plan.   Mable Paris, FNP

## 2020-07-10 NOTE — Telephone Encounter (Signed)
Patient wanted to know if you could take her husband as a new patient

## 2020-07-10 NOTE — Assessment & Plan Note (Signed)
She does not have CHF; consulted with Cardiology Dr Charlestine Night 07/2020. Delete problem.

## 2020-07-10 NOTE — Assessment & Plan Note (Signed)
Hospitalization course reviewed with patient. SOB resolved. No pain with deep inspiration. Abdominal pain nearly resolved. Discussed most likely miralax culprit for loose stools and to advance diet, slowly, with bland foods and certainly let me know if loose  doesn't resolve. Benign abdominal exam and obvious signs of fluid, edema. Surgical site intact, and no signs to suggest infection. I have sent a message to Dr Celine Ahr regarding this symptom. Pending [place] RUQ due to enlarged liver incidentally found.

## 2020-07-10 NOTE — Patient Instructions (Addendum)
Let me know if stool doesn't become more formed and certainly if frequency increases, or abdominal pain were to worsen  Ordered ultrasound of liver Let us know if you dont hear back within a week in regards to an appointment being scheduled.    Stay safe

## 2020-07-10 NOTE — Telephone Encounter (Signed)
noted 

## 2020-07-13 ENCOUNTER — Telehealth: Payer: Self-pay | Admitting: Family

## 2020-07-13 NOTE — Progress Notes (Signed)
Hi Margaret-- I don't think it sounds like anything to be worried about.  She actually was readmitted due to uncontrolled pain and had CT scans that did not show anything of concern. We will check her out on Thursday, though. Thanks! --Lavella Hammock

## 2020-07-13 NOTE — Telephone Encounter (Signed)
Call pt Unfortunately my panel is closed at this time so I may not accept him

## 2020-07-13 NOTE — Telephone Encounter (Signed)
Pt has had 1st and 2nd moderna vaccine and she just had appendectomy surgery and will discuss getting booster with her pcp

## 2020-07-15 ENCOUNTER — Ambulatory Visit (INDEPENDENT_AMBULATORY_CARE_PROVIDER_SITE_OTHER): Payer: 59 | Admitting: Surgery

## 2020-07-15 ENCOUNTER — Encounter: Payer: Self-pay | Admitting: Surgery

## 2020-07-15 ENCOUNTER — Other Ambulatory Visit: Payer: Self-pay

## 2020-07-15 VITALS — BP 138/78 | HR 82 | Temp 98.3°F | Ht 62.5 in | Wt 207.2 lb

## 2020-07-15 DIAGNOSIS — K668 Other specified disorders of peritoneum: Secondary | ICD-10-CM

## 2020-07-15 DIAGNOSIS — Z09 Encounter for follow-up examination after completed treatment for conditions other than malignant neoplasm: Secondary | ICD-10-CM

## 2020-07-15 DIAGNOSIS — K353 Acute appendicitis with localized peritonitis, without perforation or gangrene: Secondary | ICD-10-CM

## 2020-07-15 NOTE — Progress Notes (Signed)
07/15/2020  HPI: [name] is a 50 y.o. female s/p laparoscopic appendectomy with Dr. Celine Ahr on 07/02/20, with subsequent admission on 1/29 with moderate residual pneumoperitoneum causing significant pain.  Two CT scans did not reveal any source for perforation, and her pain improved significantly with Toradol.  Today she presents because she feels as if there is fluid in her abdomen and she feels her intestines moving inside.  She denies any significant or worsening pain, nausea, or vomiting.  Vital signs: BP 138/78   Pulse 82   Temp 98.3 F (36.8 C) (Oral)   Ht 5' 2.5" (1.588 m)   Wt 207 lb 3.2 oz (94 kg)   SpO2 96%   BMI 37.29 kg/m    Physical Exam: Constitutional:  No acute distress Abdomen:  Soft, non-distended, non-tender to palpation.  Incisions are healing well.  There's still some tympany on percussion of the mid abdomen, but not as extensive as on her last admission  Assessment/Plan: This is a 50 y.o. female s/p laparoscopic appendectomy with residual pneumoperitoneum  --Discussed with the patient that currently her exam is reassuring, without any evidence of peritonitis or worsening exam.  There is still some residual air likely, which is probably what she's feeling.   --Patient has ultrasound ordered for tomorrow through her PCP for evaluation of enlarged liver noted in her CT scan.  This may be able to evaluate for fluid as well. --Follow up with Dr. Celine Ahr next week.   Melvyn Neth, [facility]

## 2020-07-15 NOTE — Patient Instructions (Addendum)
Please continue with the Ultrasound. Please see your follow up with Dr.Cannon next week.

## 2020-07-16 ENCOUNTER — Ambulatory Visit
Admission: RE | Admit: 2020-07-16 | Discharge: 2020-07-16 | Disposition: A | Discharge status: Home / Self Care | Payer: 59 | Source: Ambulatory Visit | Attending: Family | Admitting: Family

## 2020-07-16 ENCOUNTER — Encounter: Payer: Self-pay | Admitting: Physician Assistant

## 2020-07-16 ENCOUNTER — Encounter: Payer: Self-pay | Admitting: General Surgery

## 2020-07-16 DIAGNOSIS — R160 Hepatomegaly, not elsewhere classified: Secondary | ICD-10-CM

## 2020-07-17 ENCOUNTER — Ambulatory Visit
Admission: RE | Admit: 2020-07-17 | Discharge: 2020-07-17 | Disposition: A | Discharge status: Home / Self Care | Payer: 59 | Source: Ambulatory Visit | Attending: Family | Admitting: Family

## 2020-07-17 ENCOUNTER — Other Ambulatory Visit: Payer: Self-pay

## 2020-07-17 DIAGNOSIS — R160 Hepatomegaly, not elsewhere classified: Secondary | ICD-10-CM | POA: Diagnosis not present

## 2020-07-17 DIAGNOSIS — K8020 Calculus of gallbladder without cholecystitis without obstruction: Secondary | ICD-10-CM | POA: Diagnosis not present

## 2020-07-20 ENCOUNTER — Other Ambulatory Visit: Payer: Self-pay | Admitting: Family

## 2020-07-20 ENCOUNTER — Encounter: Payer: Self-pay | Admitting: Family

## 2020-07-20 DIAGNOSIS — K760 Fatty (change of) liver, not elsewhere classified: Secondary | ICD-10-CM

## 2020-07-21 ENCOUNTER — Other Ambulatory Visit: Payer: Self-pay

## 2020-07-21 ENCOUNTER — Telehealth: Payer: Self-pay

## 2020-07-21 ENCOUNTER — Ambulatory Visit (INDEPENDENT_AMBULATORY_CARE_PROVIDER_SITE_OTHER): Payer: 59 | Admitting: General Surgery

## 2020-07-21 ENCOUNTER — Encounter: Payer: Self-pay | Admitting: General Surgery

## 2020-07-21 VITALS — BP 141/87 | HR 78 | Temp 98.1°F | Ht 62.5 in | Wt 202.4 lb

## 2020-07-21 DIAGNOSIS — Z9049 Acquired absence of other specified parts of digestive tract: Secondary | ICD-10-CM

## 2020-07-21 NOTE — Progress Notes (Signed)
[name] is here today for a postoperative visit.  She is a 50 year old woman who underwent laparoscopic appendectomy on July 02, 2020.  She returned to the hospital on January 29, due to uncontrolled pain and shortness of breath.  Due to concern for potential missed bowel injury, she underwent CT scans on both January 29 and January 30, without any indication of an untoward event.  Her pain was ultimately controlled and she was sent home.  She returned to the clinic on February 9, as an unscheduled visit where she saw my partner, Dr. Hampton Abbot.  She continued to express concerns about feeling fluid sloshing in her abdomen as well as a gurgling sensation whenever she eats.  He offered her reassurance.  Her primary care provider, Mable Paris, also saw her in the interim, on July 10, 2020.  Due to the incidental findings of an enlarged liver on imaging, [name] had a right upper quadrant ultrasound performed.  This did not show any excess intra-abdominal fluid, but did show multiple gallstones without concern for cholecystitis.  She did appear to have hepatic steatosis.  She is here today for an evaluation with me.  She states that she continues to feel what she describes as gas bubbles whenever she takes a deep breath in.  When she eats, she feels something moving from her right lower quadrant up the right side of her abdomen across the midline and down the left.  She sometimes hears gurgling.  She says that she has spent a considerable amount of time on the Internet and she has been worried by what she has been reading.  She did state that she returned to work yesterday and felt like she significantly improved when she had other things to occupy her mind.  Today's Vitals   07/21/20 0947  BP: (!) 141/87  Pulse: 78  Temp: 98.1 F (36.7 C)  TempSrc: Oral  SpO2: 98%  Weight: 202 lb 6.4 oz (91.8 kg)  Height: 5' 2.5" (1.588 m)   Body mass index is 36.43 kg/m. Focused abdominal exam: Her  laparoscopic port sites are healing well.  There is no erythema, induration, or drainage present.  Bowel sounds were normal.  Abdomen is soft, nontender, and nondistended.  Impression and plan: This is a 50 year old woman who had an uncomplicated laparoscopic appendectomy.  After spending some time discussing her concerns with her, it seems that she has mostly been worried and potentially hyper focused on normal digestion sounds and sensations.  I reassured her that all seemed to be completely normal and expected after having had an operation.  I also reassured her that is normal to have a decreased appetite for several weeks following an operation.  I recommended that she decrease the amount of time she spends on the Internet.  As for her gallstones, she stated that she did not really want to have cholecystectomy if she could avoid it.  At this time, she seems to be completely asymptomatic from these and therefore no operative intervention is required.  I will see her on an as-needed basis.

## 2020-07-21 NOTE — Telephone Encounter (Signed)
Left message to call back for lab results.

## 2020-07-21 NOTE — Patient Instructions (Signed)
Please call with any questions or concerns. Try [employer] for gas. Continue to walk and exercise.

## 2020-07-28 ENCOUNTER — Other Ambulatory Visit (HOSPITAL_COMMUNITY): Payer: Self-pay | Admitting: Internal Medicine

## 2020-07-28 ENCOUNTER — Ambulatory Visit: Payer: 59 | Attending: Internal Medicine

## 2020-07-28 DIAGNOSIS — Z23 Encounter for immunization: Secondary | ICD-10-CM

## 2020-07-28 NOTE — Progress Notes (Signed)
° °  Covid-19 Vaccination Clinic  Name:  [name]    MRN: [id number] DOB: 1971-04-04  07/28/2020  [name] was observed post Covid-19 immunization for 15 minutes without incident. She was provided with Vaccine Information Sheet and instruction to access the V-Safe system.   [name] was instructed to call 911 with any severe reactions post vaccine:  Difficulty breathing   Swelling of face and throat   A fast heartbeat   A bad rash all over body   Dizziness and weakness   Immunizations Administered    Name Date Dose VIS Date Route   PFIZER Comrnaty(Gray TOP) Covid-19 Vaccine 07/28/2020  2:17 PM 0.3 mL 05/14/2020 Intramuscular   Manufacturer: [place]   Lot: [id number]   NDC: [phone]

## 2020-08-04 ENCOUNTER — Encounter: Payer: 59 | Admitting: Family

## 2020-08-04 ENCOUNTER — Ambulatory Visit: Payer: 59 | Admitting: Cardiology

## 2020-08-13 ENCOUNTER — Encounter: Payer: Self-pay | Admitting: Internal Medicine

## 2020-08-13 ENCOUNTER — Other Ambulatory Visit (HOSPITAL_COMMUNITY): Payer: Self-pay | Admitting: Internal Medicine

## 2020-08-13 ENCOUNTER — Other Ambulatory Visit (HOSPITAL_COMMUNITY)
Admission: RE | Admit: 2020-08-13 | Discharge: 2020-08-13 | Disposition: A | Discharge status: Home / Self Care | Payer: 59 | Source: Ambulatory Visit | Attending: Internal Medicine | Admitting: Internal Medicine

## 2020-08-13 ENCOUNTER — Ambulatory Visit (INDEPENDENT_AMBULATORY_CARE_PROVIDER_SITE_OTHER): Payer: 59 | Admitting: Internal Medicine

## 2020-08-13 ENCOUNTER — Other Ambulatory Visit: Payer: Self-pay

## 2020-08-13 ENCOUNTER — Other Ambulatory Visit: Payer: Self-pay | Admitting: *Deleted

## 2020-08-13 VITALS — BP 118/70 | HR 89 | Temp 97.8°F | Ht 62.0 in | Wt 203.8 lb

## 2020-08-13 DIAGNOSIS — N939 Abnormal uterine and vaginal bleeding, unspecified: Secondary | ICD-10-CM | POA: Insufficient documentation

## 2020-08-13 DIAGNOSIS — R1030 Lower abdominal pain, unspecified: Secondary | ICD-10-CM | POA: Insufficient documentation

## 2020-08-13 DIAGNOSIS — R319 Hematuria, unspecified: Secondary | ICD-10-CM

## 2020-08-13 DIAGNOSIS — G4700 Insomnia, unspecified: Secondary | ICD-10-CM

## 2020-08-13 DIAGNOSIS — K219 Gastro-esophageal reflux disease without esophagitis: Secondary | ICD-10-CM | POA: Diagnosis not present

## 2020-08-13 MED ORDER — TRAZODONE HCL 100 MG PO TABS: 100.0000 mg | ORAL_TABLET | Freq: Every evening | ORAL | 3 refills | Status: DC | PRN

## 2020-08-13 NOTE — Patient Outreach (Signed)
[facility]) Care Management  08/13/2020  [name] Feb 26, 1971 [id number]   Nurse Call Center  Screening.    Referral received 08/13/20 Initial outreach call : 08/13/20 Insurance: [employer] Referral reason: Nurse Call center call, Complaint of Abdominal Pain, recommendations to contact PCP,    Subjective Outreach call to patient , explained reason for the call for follow on her call to nurse call center.  Patient discussed concerns regarding pain, discomfort that she continues to have in lower abdomen, right side now left side and at times at shoulder.  She discussed stomach just not feeling right since her appendectomy in January 2022. She reports having more discomfort when she eats, she feels hungry but  having discomfort when she does eat.  Reports using prn tylenol and ibprofen as needed.  She also discussed now having some vaginal bleeding.  She discussed having visit at PCP office on today , and she now has referral appointments with GYN and Surgeon. She states that she has already reached out to GYN office requesting a sooner appointment. She reports also contacting GI office that she has an appointment with April 5 to be on wait list for a sooner visit.  She has virtual visit with her usual PCP on tomorrow and will further discuss concerns, pain management .   Patient declines further follow up from this Care Coordinator states that she has it under control now.  Encouraged to contact sooner if new concerns question or assistance with care coordination needed, she has my contact number.   Plan Will close case to [facility] care management, referral reasons addressed.  Will send patient  successful outreach letter.    Joylene Draft, RN, BSN  [place] Management Coordinator  [phone]- Mobile [phone]- Toll Free Main Office

## 2020-08-13 NOTE — Patient Instructions (Addendum)
You can try pepcid, prilosec or nexium over the counter  Referred [facility]  [facility]     Dysfunctional Uterine Bleeding Dysfunctional uterine bleeding is abnormal bleeding from the uterus. Dysfunctional uterine bleeding includes:  A menstrual period that comes earlier or later than usual.  A menstrual period that is lighter or heavier than usual, or has large blood clots.  Vaginal bleeding between menstrual periods.  Skipping one or more menstrual periods.  Vaginal bleeding after sex.  Vaginal bleeding after menopause. Follow these instructions at home: Eating and drinking  Eat well-balanced meals. Include foods that are high in iron, such as liver, meat, shellfish, green leafy vegetables, and eggs.  To prevent or treat constipation, your health care provider may recommend that you: ? Drink enough fluid to keep your urine pale yellow. ? Take over-the-counter or prescription medicines. ? Eat foods that are high in fiber, such as beans, whole grains, and fresh fruits and vegetables. ? Limit foods that are high in fat and processed sugars, such as fried or sweet foods.   Medicines  Take over-the-counter and prescription medicines only as told by your health care provider.  Do not change medicines without talking with your health care provider.  Aspirin or medicines that contain aspirin may make the bleeding worse. Do not take those medicines: ? During the week before your menstrual period. ? During your menstrual period.  If you were prescribed iron pills, take them as told by your health care provider. Iron pills help to replace iron that your body loses because of this condition. Activity  If you need to change your sanitary pad or tampon more than one time every 2 hours: ? Lie in bed with your feet raised (elevated). ? Place a cold pack on your lower abdomen. ? Rest as much as possible until the bleeding stops or slows down.  Do not try to lose weight  until the bleeding has stopped and your blood iron level is back to normal. General instructions  For two months, write down: ? When your menstrual period starts. ? When your menstrual period ends. ? When any abnormal vaginal bleeding occurs. ? What problems you notice.  Keep all follow up visits as told by your health care provider. This is important.   Contact a health care provider if you:  Feel light-headed or weak.  Have nausea and vomiting.  Cannot eat or drink without vomiting.  Feel dizzy or have diarrhea while you are taking medicines.  Are taking birth control pills or hormones, and you want to change them or stop taking them. Get help right away if:  You develop a fever or chills.  You need to change your sanitary pad or tampon more than one time per hour.  Your vaginal bleeding becomes heavier, or your flow contains clots more often.  You develop pain in your abdomen.  You lose consciousness.  You develop a rash. Summary  Dysfunctional uterine bleeding is abnormal bleeding from the uterus.  It includes menstrual bleeding of abnormal duration, volume, or regularity.  Bleeding after sex and after menopause are also considered dysfunctional uterine bleeding. This information is not intended to replace advice given to you by your health care provider. Make sure you discuss any questions you have with your health care provider. Document Revised: 11/01/2017 Document Reviewed: 11/01/2017 Elsevier Patient Education  2021 [place].  Gastroesophageal Reflux Disease, Adult Gastroesophageal reflux (GER) happens when acid from the stomach flows up into the tube that  connects the mouth and the stomach (esophagus). Normally, food travels down the esophagus and stays in the stomach to be digested. However, when a person has GER, food and stomach acid sometimes move back up into the esophagus. If this becomes a more serious problem, the person may be diagnosed with a  disease called gastroesophageal reflux disease (GERD). GERD occurs when the reflux:  Happens often.  Causes frequent or severe symptoms.  Causes problems such as damage to the esophagus. When stomach acid comes in contact with the esophagus, the acid may cause inflammation in the esophagus. Over time, GERD may create small holes (ulcers) in the lining of the esophagus. What are the causes? This condition is caused by a problem with the muscle between the esophagus and the stomach (lower esophageal sphincter, or LES). Normally, the LES muscle closes after food passes through the esophagus to the stomach. When the LES is weakened or abnormal, it does not close properly, and that allows food and stomach acid to go back up into the esophagus. The LES can be weakened by certain dietary substances, medicines, and medical conditions, including:  Tobacco use.  Pregnancy.  Having a hiatal hernia.  Alcohol use.  Certain foods and beverages, such as coffee, chocolate, onions, and peppermint. What increases the risk? You are more likely to develop this condition if you:  Have an increased body weight.  Have a connective tissue disorder.  Take NSAIDs, such as ibuprofen. What are the signs or symptoms? Symptoms of this condition include:  Heartburn.  Difficult or painful swallowing and the feeling of having a lump in the throat.  A bitter taste in the mouth.  Bad breath and having a large amount of saliva.  Having an upset or bloated stomach and belching.  Chest pain. Different conditions can cause chest pain. Make sure you see your health care provider if you experience chest pain.  Shortness of breath or wheezing.  Ongoing (chronic) cough or a nighttime cough.  Wearing away of tooth enamel.  Weight loss. How is this diagnosed? This condition may be diagnosed based on a medical history and a physical exam. To determine if you have mild or severe GERD, your health care provider  may also monitor how you respond to treatment. You may also have tests, including:  A test to examine your stomach and esophagus with a small camera (endoscopy).  A test that measures the acidity level in your esophagus.  A test that measures how much pressure is on your esophagus.  A barium swallow or modified barium swallow test to show the shape, size, and functioning of your esophagus. How is this treated? Treatment for this condition may vary depending on how severe your symptoms are. Your health care provider may recommend:  Changes to your diet.  Medicine.  Surgery. The goal of treatment is to help relieve your symptoms and to prevent complications. Follow these instructions at home: Eating and drinking  Follow a diet as recommended by your health care provider. This may involve avoiding foods and drinks such as: ? Coffee and tea, with or without caffeine. ? Drinks that contain alcohol. ? Energy drinks and sports drinks. ? Carbonated drinks or sodas. ? Chocolate and cocoa. ? Peppermint and mint flavorings. ? Garlic and onions. ? Horseradish. ? Spicy and acidic foods, including peppers, chili powder, curry powder, vinegar, hot sauces, and barbecue sauce. ? Citrus fruit juices and citrus fruits, such as oranges, lemons, and limes. ? Tomato-based foods, such as red sauce,  chili, salsa, and pizza with red sauce. ? Fried and fatty foods, such as donuts, french fries, potato chips, and high-fat dressings. ? High-fat meats, such as hot dogs and fatty cuts of red and white meats, such as rib eye steak, sausage, ham, and bacon. ? High-fat dairy items, such as whole milk, butter, and cream cheese.  Eat small, frequent meals instead of large meals.  Avoid drinking large amounts of liquid with your meals.  Avoid eating meals during the 2-3 hours before bedtime.  Avoid lying down right after you eat.  Do not exercise right after you eat.   Lifestyle  Do not use any products  that contain nicotine or tobacco. These products include cigarettes, chewing tobacco, and vaping devices, such as e-cigarettes. If you need help quitting, ask your health care provider.  Try to reduce your stress by using methods such as yoga or meditation. If you need help reducing stress, ask your health care provider.  If you are overweight, reduce your weight to an amount that is healthy for you. Ask your health care provider for guidance about a safe weight loss goal.   General instructions  Pay attention to any changes in your symptoms.  Take over-the-counter and prescription medicines only as told by your health care provider. Do not take aspirin, ibuprofen, or other NSAIDs unless your health care provider told you to take these medicines.  Wear loose-fitting clothing. Do not wear anything tight around your waist that causes pressure on your abdomen.  Raise (elevate) the head of your bed about 6 inches (15 cm). You can use a wedge to do this.  Avoid bending over if this makes your symptoms worse.  Keep all follow-up visits. This is important. Contact a health care provider if:  You have: ? New symptoms. ? Unexplained weight loss. ? Difficulty swallowing or it hurts to swallow. ? Wheezing or a persistent cough. ? A hoarse voice.  Your symptoms do not improve with treatment. Get help right away if:  You have sudden pain in your arms, neck, jaw, teeth, or back.  You suddenly feel sweaty, dizzy, or light-headed.  You have chest pain or shortness of breath.  You vomit and the vomit is green, yellow, or black, or it looks like blood or coffee grounds.  You faint.  You have stool that is red, bloody, or black.  You cannot swallow, drink, or eat. These symptoms may represent a serious problem that is an emergency. Do not wait to see if the symptoms will go away. Get medical help right away. Call your local emergency services (911 in the [place] Do not drive yourself to the  hospital. Summary  Gastroesophageal reflux happens when acid from the stomach flows up into the esophagus. GERD is a disease in which the reflux happens often, causes frequent or severe symptoms, or causes problems such as damage to the esophagus.  Treatment for this condition may vary depending on how severe your symptoms are. Your health care provider may recommend diet and lifestyle changes, medicine, or surgery.  Contact a health care provider if you have new or worsening symptoms.  Take over-the-counter and prescription medicines only as told by your health care provider. Do not take aspirin, ibuprofen, or other NSAIDs unless your health care provider told you to do so.  Keep all follow-up visits as told by your health care provider. This is important. This information is not intended to replace advice given to you by your health care provider. Make  sure you discuss any questions you have with your health care provider. Document Revised: 12/02/2019 Document Reviewed: 12/02/2019 Elsevier Patient Education  2021 [place] for Gastroesophageal Reflux Disease, Adult When you have gastroesophageal reflux disease (GERD), the foods you eat and your eating habits are very important. Choosing the right foods can help ease the discomfort of GERD. Consider working with a dietitian to help you make healthy food choices. What are tips for following this plan? Reading food labels  Look for foods that are low in saturated fat. Foods that have less than 5% of daily value (DV) of fat and 0 g of trans fats may help with your symptoms. Cooking  Cook foods using methods other than frying. This may include baking, steaming, grilling, or broiling. These are all methods that do not need a lot of fat for cooking.  To add flavor, try to use herbs that are low in spice and acidity. Meal planning  Choose healthy foods that are low in fat, such as fruits, vegetables, whole grains, low-fat  dairy products, lean meats, fish, and poultry.  Eat frequent, small meals instead of three large meals each day. Eat your meals slowly, in a relaxed setting. Avoid bending over or lying down until 2-3 hours after eating.  Limit high-fat foods such as fatty meats or fried foods.  Limit your intake of fatty foods, such as oils, butter, and shortening.  Avoid the following as told by your health care provider: ? Foods that cause symptoms. These may be different for different people. Keep a food diary to keep track of foods that cause symptoms. ? Alcohol. ? Drinking large amounts of liquid with meals. ? Eating meals during the 2-3 hours before bed.   Lifestyle  Maintain a healthy weight. Ask your health care provider what weight is healthy for you. If you need to lose weight, work with your health care provider to do so safely.  Exercise for at least 30 minutes on 5 or more days each week, or as told by your health care provider.  Avoid wearing clothes that fit tightly around your waist and chest.  Do not use any products that contain nicotine or tobacco. These products include cigarettes, chewing tobacco, and vaping devices, such as e-cigarettes. If you need help quitting, ask your health care provider.  Sleep with the head of your bed raised. Use a wedge under the mattress or blocks under the bed frame to raise the head of the bed.  Chew sugar-free gum after mealtimes. What foods should I eat? Eat a healthy, well-balanced diet of fruits, vegetables, whole grains, low-fat dairy products, lean meats, fish, and poultry. Each person is different. Foods that may trigger symptoms in one person may not trigger any symptoms in another person. Work with your health care provider to identify foods that are safe for you. The items listed above may not be a complete list of recommended foods and beverages. Contact a dietitian for more information.   What foods should I avoid? Limiting some of these  foods may help manage the symptoms of GERD. Everyone is different. Consult a dietitian or your health care provider to help you identify the exact foods to avoid, if any. Fruits Any fruits prepared with added fat. Any fruits that cause symptoms. For some people this may include citrus fruits, such as oranges, grapefruit, pineapple, and lemons. Vegetables Deep-fried vegetables. [place] fries. Any vegetables prepared with added fat. Any vegetables that cause symptoms. For some people,  this may include tomatoes and tomato products, chili peppers, onions and garlic, and horseradish. Grains Pastries or quick breads with added fat. Meats and other proteins High-fat meats, such as fatty beef or pork, hot dogs, ribs, ham, sausage, salami, and bacon. Fried meat or protein, including fried fish and fried chicken. Nuts and nut butters, in large amounts. Dairy Whole milk and chocolate milk. Sour cream. Cream. Ice cream. Cream cheese. Milkshakes. Fats and oils Butter. Margarine. Shortening. Ghee. Beverages Coffee and tea, with or without caffeine. Carbonated beverages. Sodas. Energy drinks. Fruit juice made with acidic fruits, such as orange or grapefruit. Tomato juice. Alcoholic drinks. Sweets and desserts Chocolate and cocoa. Donuts. Seasonings and condiments Pepper. Peppermint and spearmint. Added salt. Any condiments, herbs, or seasonings that cause symptoms. For some people, this may include curry, hot sauce, or vinegar-based salad dressings. The items listed above may not be a complete list of foods and beverages to avoid. Contact a dietitian for more information. Questions to ask your health care provider Diet and lifestyle changes are usually the first steps that are taken to manage symptoms of GERD. If diet and lifestyle changes do not improve your symptoms, talk with your health care provider about taking medicines. Where to find more information  International Foundation for Gastrointestinal  Disorders: aboutgerd.org Summary  When you have gastroesophageal reflux disease (GERD), food and lifestyle choices may be very helpful in easing the discomfort of GERD.  Eat frequent, small meals instead of three large meals each day. Eat your meals slowly, in a relaxed setting. Avoid bending over or lying down until 2-3 hours after eating.  Limit high-fat foods such as fatty meats or fried foods. This information is not intended to replace advice given to you by your health care provider. Make sure you discuss any questions you have with your health care provider. Document Revised: 12/02/2019 Document Reviewed: 12/02/2019 Elsevier Patient Education  [place].

## 2020-08-13 NOTE — Progress Notes (Signed)
Chief Complaint  Patient presents with  . Vaginal Bleeding    Vaginal bleeding, discharge, pain in lower L abdomen. Needs refill on Trazadone sent to [facility]    F/u  1. C/o lower ab pain left sided 4-5/10 daily worse with food since appendix removed 07/02/20 had 3 CT scan  and c/o of stomach burning and vaginal discharge pink to clear since 08/12/20 pm and lower abdomen pain  appt surgery [place] but wants 2nd opinion [facility]  appt GI Dr. Allen Norris 09/08/20  Referred ob/gyn for vaginal bleeding   Ct ab/pelvis IMPRESSION: 1. Early acute appendicitis. No evidence of perforation or abscess. 2. Cholelithiasis.   Electronically Signed   By: Titus Dubin M.D.   On: 07/02/2020 12:12 Ct ab/pelvis  IMPRESSION: 1. Moderate volume pneumoperitoneum likely postsurgical in etiology. No definite inflammatory changes to suggest staple/suture dehiscence or bowel perforation status post appendectomy. 2. Scattered patchy ground-glass airspace opacities may be due to expiratory phase of respiration; however, overlying infection such as COVID-19 not excluded. 3. No central or segmental pulmonary embolus. Limited evaluation due to timing of contrast and motion artifact. 4. Cholelithiasis with no acute cholecystitis.  These results were called by telephone at the time of interpretation on 07/04/2020 at 2:20 am to provider [facility] , who verbally acknowledged these results.  07/05/20  IMPRESSION: 1. Stable to possibly slightly decreased moderate volume pneumoperitoneum in a patient status post laparoscopic appendectomy. No extravasation of PO contrast or inflammatory changes within the abdomen/pelvis to suggest stable/suture dehiscence or bowel perforation. 2. Scattered patchy ground-glass airspace opacities. Could represent infection/inflammation. COVID-19 infection not excluded. 3. Cholelithiasis with no acute cholecystitis. 4. Hepatomegaly.   Electronically Signed   By:  Iven Finn M.D.   On: 07/05/2020 05:31 2. Chronic insomnia on trazadone 100 mg qhs needs refill   Review of Systems  Constitutional: Negative for weight loss.  HENT: Negative for hearing loss.   Eyes: Negative for blurred vision.  Respiratory: Negative for shortness of breath.   Cardiovascular: Negative for chest pain.  Gastrointestinal: Positive for abdominal pain. Negative for nausea and vomiting.  Genitourinary:       +vaginal bleeding   Musculoskeletal: Negative for falls and joint pain.  Skin: Negative for rash.  Neurological: Negative for headaches.  Psychiatric/Behavioral: Negative for depression.   Past Medical History:  Diagnosis Date  . Akathisia 12/17/2014  . Bipolar affective ([place])   . Restless leg syndrome   . Schizoaffective disorder ([place])   . Schizoaffective disorder [facility])    Past Surgical History:  Procedure Laterality Date  . CESAREAN SECTION    . LAPAROSCOPIC APPENDECTOMY N/A 07/02/2020   Procedure: APPENDECTOMY LAPAROSCOPIC;  Surgeon: Fredirick Maudlin, MD;  Location: [facility] ORS;  Service: General;  Laterality: N/A;   Family History  Problem Relation Age of Onset  . Drug abuse Maternal Aunt    Social History   Socioeconomic History  . Marital status: Married    Spouse name: [name]  . Number of children: 2  . Years of education: 34  . Highest education level: Some college, no degree  Occupational History  . Not on file  Tobacco Use  . Smoking status: Current Every Day Smoker    Packs/day: 0.50    Years: 10.00    Pack years: 5.00    Types: Cigarettes  . Smokeless tobacco: Never Used  Vaping Use  . Vaping Use: Former  Substance and Sexual Activity  . Alcohol use: No  . Drug use: No  .  Sexual activity: Yes  Other Topics Concern  . 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 [DISCONTINUED] traZODone (DESYREL) 100 MG tablet Take 1 tablet by mouth at bedtime as needed.   No Known Allergies Recent Results (from the past 2160 hour(s))  ECHOCARDIOGRAM COMPLETE     Status: None   Collection Time: 05/22/20 10:45 AM  Result Value Ref Range   Ao pk vel 1.16 m/s   AV Area VTI 2.08 cm2   AR max vel 1.83 cm2   AV Mean grad 3.3 mmHg   AV Peak grad 5.4 mmHg   S' Lateral 2.58 cm   AV Area mean vel 1.75 cm2   Area-P 1/2 5.31 cm2  CBC with Differential     Status: None   Collection Time: 07/02/20  6:57 AM  Result Value Ref Range   WBC 8.9 4.0 - 10.5 K/uL   RBC 4.79 3.87 - 5.11 MIL/uL   Hemoglobin 14.0 12.0 - 15.0 g/dL   HCT 42.3 36.0 - 46.0 %   MCV 88.3 80.0 - 100.0 fL   MCH 29.2 26.0 - 34.0 pg   MCHC 33.1 30.0 - 36.0 g/dL   RDW 13.9 11.5 - 15.5 %   Platelets 358 150 - 400 K/uL   nRBC 0.0 0.0 - 0.2 %   Neutrophils Relative % 49 %   Neutro Abs 4.4 1.7 - 7.7 K/uL   Lymphocytes Relative 38 %   Lymphs Abs 3.4 0.7 - 4.0 K/uL   Monocytes Relative 8 %   Monocytes Absolute 0.7 0.1 - 1.0 K/uL   Eosinophils Relative 4 %   Eosinophils Absolute 0.3 0.0 - 0.5 K/uL   Basophils Relative 1 %   Basophils Absolute 0.1 0.0 - 0.1 K/uL   Immature Granulocytes 0 %   Abs Immature Granulocytes 0.03 0.00 - 0.07 K/uL    Comment: Performed at [facility], [address] [place], [place] [address]  Comprehensive metabolic panel     Status: Abnormal   Collection Time: 07/02/20  6:57 AM  Result Value Ref Range   Sodium 138 135 - 145 mmol/L   Potassium 4.1 3.5 - 5.1 mmol/L   Chloride 106 98 - 111 mmol/L   CO2 21 (L) 22 - 32 mmol/L   Glucose, Bld 124 (H) 70 - 99 mg/dL    Comment: Glucose reference range applies only to samples taken after  fasting for at least 8 hours.   BUN 18 6 - 20 mg/dL   Creatinine, Ser 0.69 0.44 - 1.00 mg/dL   Calcium 9.3 8.9 - 10.3 mg/dL   Total Protein 7.7 6.5 - 8.1 g/dL   Albumin 4.3 3.5 - 5.0 g/dL   AST 23 15 - 41 U/L   ALT 32 0 - 44 U/L   Alkaline Phosphatase 83 38 - 126 U/L   Total Bilirubin 0.7 0.3 - 1.2 mg/dL   GFR, Estimated >60 >60 mL/min    Comment: (NOTE) Calculated using the CKD-EPI Creatinine Equation (2021)  Anion gap 11 5 - 15    Comment: Performed at [facility], [address] [place], [place] [address]  Lipase, blood     Status: None   Collection Time: 07/02/20  6:57 AM  Result Value Ref Range   Lipase 26 11 - 51 U/L    Comment: Performed at [facility], [address] [place], [place] [address]  hCG, quantitative, pregnancy     Status: Abnormal   Collection Time: 07/02/20  6:57 AM  Result Value Ref Range   hCG, Beta Chain, Quant, S 7 (H) <5 mIU/mL    Comment:          GEST. AGE      CONC.  (mIU/mL)   <=1 WEEK        5 - 50     2 WEEKS       50 - 500     3 WEEKS       100 - 10,000     4 WEEKS     1,000 - 30,000     5 WEEKS     3,500 - 115,000   6-8 WEEKS     12,000 - 270,000    12 WEEKS     15,000 - 220,000        FEMALE AND NON-PREGNANT FEMALE:     LESS THAN 5 mIU/mL Performed at [facility], [address] [place], [place] [address]   Urinalysis, Complete w Microscopic Urine, Clean Catch     Status: Abnormal   Collection Time: 07/02/20  6:58 AM  Result Value Ref Range   Color, Urine YELLOW (A) YELLOW   APPearance HAZY (A) CLEAR   Specific Gravity, Urine 1.023 1.005 - 1.030   pH 5.0 5.0 - 8.0   Glucose, UA NEGATIVE NEGATIVE mg/dL   Hgb urine dipstick NEGATIVE NEGATIVE   Bilirubin Urine NEGATIVE NEGATIVE   Ketones, ur NEGATIVE NEGATIVE mg/dL   Protein, ur NEGATIVE NEGATIVE mg/dL   Nitrite NEGATIVE NEGATIVE   Leukocytes,Ua NEGATIVE NEGATIVE   RBC / HPF 0-5 0 - 5 RBC/hpf   WBC, UA 0-5 0 - 5 WBC/hpf   Bacteria, UA RARE (A)  NONE SEEN   Squamous Epithelial / LPF 0-5 0 - 5   Mucus PRESENT    Hyaline Casts, UA PRESENT     Comment: Performed at [facility], [address] [place],  [address]  POC Urine Pregnancy, ED     Status: Normal   Collection Time: 07/02/20  6:58 AM  Result Value Ref Range   Preg Test, Ur Negative Negative  SARS Coronavirus 2 by RT PCR (hospital order, performed in [place] hospital lab) Nasopharyngeal Nasopharyngeal Swab     Status: None   Collection Time: 07/02/20 12:30 PM   Specimen: Nasopharyngeal Swab  Result Value Ref Range   SARS Coronavirus 2 NEGATIVE NEGATIVE    Comment: (NOTE) SARS-CoV-2 target nucleic acids are NOT DETECTED.  The SARS-CoV-2 RNA is generally detectable in upper and lower respiratory specimens during the acute phase of infection. The lowest concentration of SARS-CoV-2 viral copies this assay can detect is 250 copies / mL. A negative result does not preclude SARS-CoV-2 infection and should not be used as the sole basis for treatment or other patient management decisions.  A negative result may occur with improper specimen collection / handling, submission of specimen other than nasopharyngeal swab, presence of viral mutation(s) within the areas targeted by this assay, and inadequate number of  viral copies (<250 copies / mL). A negative result must be combined with clinical observations, patient history, and epidemiological information.  Fact Sheet for Patients:   StrictlyIdeas.no  Fact Sheet for Healthcare Providers: BankingDealers.co.za  This test is not yet approved or  cleared by the [place] FDA and has been authorized for detection and/or diagnosis of SARS-CoV-2 by FDA under an Emergency Use Authorization (EUA).  This EUA will remain in effect (meaning this test can be used) for the duration of the COVID-19 declaration under Section 564(b)(1) of the Act, 21 U.S.C. section  360bbb-3(b)(1), unless the authorization is terminated or revoked sooner.  Performed at [facility], [address] [place], [place] [address]   Surgical pathology     Status: None   Collection Time: 07/02/20  3:44 PM  Result Value Ref Range   SURGICAL PATHOLOGY      SURGICAL PATHOLOGY CASE: [id number] PATIENT: [place] [facility] Report     Specimen Submitted: A. Appendix  Clinical History: Acute appendicitis     DIAGNOSIS: A. APPENDIX; APPENDECTOMY: - ACUTE SUPPURATIVE APPENDICITIS.  GROSS DESCRIPTION: A. Labeled: Appendix Received: Formalin Collection time: 3:48 PM on 07/02/2020 Placed into formalin time: 3:50 PM on 07/02/2020 Size: 6.2 cm long by 0.7 cm in diameter with an attached portion of periappendiceal adipose tissue, 6.8 x 2.2 x 1.4 cm External surface: The serosa is tan-brown, smooth, and glistening with areas of finely adherent white fibrinous exudate. Perforation: None grossly appreciated. Fecalith: None grossly appreciated. Description: The proximal margin is inked blue.  The lumen contains brown softened fecal matter.  The mucosa is tan and diffusely flattened. The wall thickness ranges from 0.1 to 0.2 cm.  Block summary: 1 - one half bisected appendiceal tip and resection margin wit h representative cross-sections  Final Diagnosis performed by Bryan Lemma, MD.   Electronically signed 07/06/2020 11:56:25AM The electronic signature indicates that the named Attending Pathologist has evaluated the specimen Technical component performed at [facility], [address] [place], [place] [address] Lab: [phone] Dir: Rush Farmer, MD, MMM  Professional component performed at [facility], [facility], [address], [place], [place] [address] Lab: [phone] Dir: Dellia Nims. Rubinas, MD   Urinalysis, Complete w Microscopic     Status: Abnormal   Collection Time: 07/04/20 12:22 AM  Result Value Ref Range    Color, Urine YELLOW (A) YELLOW   APPearance HAZY (A) CLEAR   Specific Gravity, Urine 1.018 1.005 - 1.030   pH 5.0 5.0 - 8.0   Glucose, UA NEGATIVE NEGATIVE mg/dL   Hgb urine dipstick NEGATIVE NEGATIVE   Bilirubin Urine NEGATIVE NEGATIVE   Ketones, ur NEGATIVE NEGATIVE mg/dL   Protein, ur NEGATIVE NEGATIVE mg/dL   Nitrite NEGATIVE NEGATIVE   Leukocytes,Ua NEGATIVE NEGATIVE   RBC / HPF 0-5 0 - 5 RBC/hpf   WBC, UA 0-5 0 - 5 WBC/hpf   Bacteria, UA NONE SEEN NONE SEEN   Squamous Epithelial / LPF 6-10 0 - 5   Mucus PRESENT     Comment: Performed at [facility], [address] [place], [place] [address]  Lipase, blood     Status: None   Collection Time: 07/04/20 12:25 AM  Result Value Ref Range   Lipase 22 11 - 51 U/L    Comment: Performed at [facility], [address], [place], [place] [address]  Comprehensive metabolic panel     Status: Abnormal   Collection Time: 07/04/20 12:25 AM  Result Value Ref Range  Sodium 142 135 - 145 mmol/L   Potassium 4.0 3.5 - 5.1 mmol/L    Comment: HEMOLYSIS AT THIS LEVEL MAY AFFECT RESULT   Chloride 105 98 - 111 mmol/L   CO2 23 22 - 32 mmol/L   Glucose, Bld 125 (H) 70 - 99 mg/dL    Comment: Glucose reference range applies only to samples taken after fasting for at least 8 hours.   BUN 17 6 - 20 mg/dL   Creatinine, Ser 0.86 0.44 - 1.00 mg/dL   Calcium 9.3 8.9 - 10.3 mg/dL   Total Protein 7.9 6.5 - 8.1 g/dL   Albumin 4.0 3.5 - 5.0 g/dL   AST 29 15 - 41 U/L    Comment: HEMOLYSIS AT THIS LEVEL MAY AFFECT RESULT   ALT 24 0 - 44 U/L   Alkaline Phosphatase 68 38 - 126 U/L   Total Bilirubin 0.8 0.3 - 1.2 mg/dL    Comment: HEMOLYSIS AT THIS LEVEL MAY AFFECT RESULT   GFR, Estimated >60 >60 mL/min    Comment: (NOTE) Calculated using the CKD-EPI Creatinine Equation (2021)    Anion gap 14 5 - 15    Comment: Performed at [facility], [address] [place], [place] [address]  CBC     Status: Abnormal   Collection Time:  07/04/20 12:25 AM  Result Value Ref Range   WBC 20.1 (H) 4.0 - 10.5 K/uL   RBC 4.41 3.87 - 5.11 MIL/uL   Hemoglobin 13.1 12.0 - 15.0 g/dL   HCT 39.8 36.0 - 46.0 %   MCV 90.2 80.0 - 100.0 fL   MCH 29.7 26.0 - 34.0 pg   MCHC 32.9 30.0 - 36.0 g/dL   RDW 14.3 11.5 - 15.5 %   Platelets 349 150 - 400 K/uL   nRBC 0.0 0.0 - 0.2 %    Comment: Performed at [facility], [address] [place], [place] [address]  Blood Culture (routine x 2)     Status: None   Collection Time: 07/04/20 12:50 AM   Specimen: BLOOD  Result Value Ref Range   Specimen Description BLOOD BLOOD LEFT FOREARM    Special Requests      BOTTLES DRAWN AEROBIC AND ANAEROBIC Blood Culture adequate volume   Culture      NO GROWTH 5 DAYS Performed at [facility], [address], [place], [place] [address]    Report Status 07/09/2020 FINAL   SARS Coronavirus 2 by RT PCR (hospital order, performed in [place] hospital lab) Nasopharyngeal Nasopharyngeal Swab     Status: None   Collection Time: 07/04/20  5:12 AM   Specimen: Nasopharyngeal Swab  Result Value Ref Range   SARS Coronavirus 2 NEGATIVE NEGATIVE    Comment: (NOTE) SARS-CoV-2 target nucleic acids are NOT DETECTED.  The SARS-CoV-2 RNA is generally detectable in upper and lower respiratory specimens during the acute phase of infection. The lowest concentration of SARS-CoV-2 viral copies this assay can detect is 250 copies / mL. A negative result does not preclude SARS-CoV-2 infection and should not be used as the sole basis for treatment or other patient management decisions.  A negative result may occur with improper specimen collection / handling, submission of specimen other than nasopharyngeal swab, presence of viral mutation(s) within the areas targeted by this assay, and inadequate number of viral copies (<250 copies / mL). A negative result must be combined with clinical observations, patient history, and epidemiological  information.  Fact Sheet for Patients:   StrictlyIdeas.no  Fact Sheet for Healthcare Providers: BankingDealers.co.za  This test is not yet approved or  cleared by the [place] FDA and has been authorized for detection and/or diagnosis of SARS-CoV-2 by FDA under an Emergency Use Authorization (EUA).  This EUA will remain in effect (meaning this test can be used) for the duration of the COVID-19 declaration under Section 564(b)(1) of the Act, 21 U.S.C. section 360bbb-3(b)(1), unless the authorization is terminated or revoked sooner.  Performed at [facility], [address] [place], [place] [address]   POC SARS Coronavirus 2 Ag-ED - Nasal Swab (BD Veritor Kit)     Status: Normal   Collection Time: 07/04/20  5:36 AM  Result Value Ref Range   SARS Coronavirus 2 Ag Negative Negative  Lactic acid, plasma     Status: None   Collection Time: 07/04/20  6:04 AM  Result Value Ref Range   Lactic Acid, Venous 1.1 0.5 - 1.9 mmol/L    Comment: Performed at [facility], [address] [place], [place] [address]  Blood Culture (routine x 2)     Status: None   Collection Time: 07/04/20  6:04 AM   Specimen: BLOOD  Result Value Ref Range   Specimen Description BLOOD BLOOD LEFT FOREARM    Special Requests      BOTTLES DRAWN AEROBIC AND ANAEROBIC Blood Culture adequate volume   Culture      NO GROWTH 5 DAYS Performed at [facility], [address] [place], [place] [address]    Report Status 07/09/2020 FINAL   Procalcitonin - Baseline     Status: None   Collection Time: 07/04/20  6:04 AM  Result Value Ref Range   Procalcitonin <0.10 ng/mL    Comment:        Interpretation: PCT (Procalcitonin) <= 0.5 ng/mL: Systemic infection (sepsis) is not likely. Local bacterial infection is possible. (NOTE)       Sepsis PCT Algorithm           Lower Respiratory Tract                                      Infection PCT  Algorithm    ----------------------------     ----------------------------         PCT < 0.25 ng/mL                PCT < 0.10 ng/mL          Strongly encourage             Strongly discourage   discontinuation of antibiotics    initiation of antibiotics    ----------------------------     -----------------------------       PCT 0.25 - 0.50 ng/mL            PCT 0.10 - 0.25 ng/mL               OR       >80% decrease in PCT            Discourage initiation of                                            antibiotics      Encourage discontinuation           of  antibiotics    ----------------------------     -----------------------------         PCT >= 0.50 ng/mL              PCT 0.26 - 0.50 ng/mL               AND        <80% decrease in PCT             Encourage initiation of                                             antibiotics       Encourage continuation           of antibiotics    ----------------------------     -----------------------------        PCT >= 0.50 ng/mL                  PCT > 0.50 ng/mL               AND         increase in PCT                  Strongly encourage                                      initiation of antibiotics    Strongly encourage escalation           of antibiotics                                     -----------------------------                                           PCT <= 0.25 ng/mL                                                 OR                                        > 80% decrease in PCT                                      Discontinue / Do not initiate                                             antibiotics  Performed at [facility], [address] [place], [place] [address]   HIV Antibody (routine testing w rflx)     Status: None   Collection Time: 07/04/20  8:30 AM  Result Value Ref Range   HIV Screen 4th Generation wRfx  Non Reactive Non Reactive    Comment: Performed at [facility], [place] [address],  [place], [place] [address]  CBC     Status: Abnormal   Collection Time: 07/04/20 11:50 AM  Result Value Ref Range   WBC 15.9 (H) 4.0 - 10.5 K/uL   RBC 3.87 3.87 - 5.11 MIL/uL   Hemoglobin 11.4 (L) 12.0 - 15.0 g/dL   HCT 34.9 (L) 36.0 - 46.0 %   MCV 90.2 80.0 - 100.0 fL   MCH 29.5 26.0 - 34.0 pg   MCHC 32.7 30.0 - 36.0 g/dL   RDW 14.6 11.5 - 15.5 %   Platelets 297 150 - 400 K/uL   nRBC 0.0 0.0 - 0.2 %    Comment: Performed at [facility], [address] [place], [place] [address]  Lactic acid, plasma     Status: None   Collection Time: 07/04/20 11:50 AM  Result Value Ref Range   Lactic Acid, Venous 0.8 0.5 - 1.9 mmol/L    Comment: Performed at [facility], [address] [place], [place] [address]  Basic metabolic panel     Status: Abnormal   Collection Time: 07/04/20 11:50 AM  Result Value Ref Range   Sodium 138 135 - 145 mmol/L   Potassium 3.5 3.5 - 5.1 mmol/L   Chloride 104 98 - 111 mmol/L   CO2 23 22 - 32 mmol/L   Glucose, Bld 97 70 - 99 mg/dL    Comment: Glucose reference range applies only to samples taken after fasting for at least 8 hours.   BUN 13 6 - 20 mg/dL   Creatinine, Ser 0.68 0.44 - 1.00 mg/dL   Calcium 8.6 (L) 8.9 - 10.3 mg/dL   GFR, Estimated >60 >60 mL/min    Comment: (NOTE) Calculated using the CKD-EPI Creatinine Equation (2021)    Anion gap 11 5 - 15    Comment: Performed at [facility], [address] [place], [place] [address]  CBC     Status: Abnormal   Collection Time: 07/04/20  7:10 PM  Result Value Ref Range   WBC 13.7 (H) 4.0 - 10.5 K/uL   RBC 3.76 (L) 3.87 - 5.11 MIL/uL   Hemoglobin 11.0 (L) 12.0 - 15.0 g/dL   HCT 33.9 (L) 36.0 - 46.0 %   MCV 90.2 80.0 - 100.0 fL   MCH 29.3 26.0 - 34.0 pg   MCHC 32.4 30.0 - 36.0 g/dL   RDW 14.3 11.5 - 15.5 %   Platelets 296 150 - 400 K/uL   nRBC 0.0 0.0 - 0.2 %    Comment: Performed at [facility], [address] [place], [place] [address]  Lactic acid, plasma      Status: None   Collection Time: 07/04/20  7:10 PM  Result Value Ref Range   Lactic Acid, Venous 0.9 0.5 - 1.9 mmol/L    Comment: Performed at [facility], [address], [place], [place] [address]  Basic metabolic panel     Status: Abnormal   Collection Time: 07/05/20  3:36 AM  Result Value Ref Range   Sodium 137 135 - 145 mmol/L   Potassium 3.4 (L) 3.5 - 5.1 mmol/L   Chloride 103 98 - 111 mmol/L   CO2 23 22 - 32 mmol/L   Glucose, Bld 97 70 - 99 mg/dL    Comment: Glucose reference range applies only to samples taken after fasting for at least 8 hours.   BUN 15  6 - 20 mg/dL   Creatinine, Ser 0.70 0.44 - 1.00 mg/dL   Calcium 8.7 (L) 8.9 - 10.3 mg/dL   GFR, Estimated >60 >60 mL/min    Comment: (NOTE) Calculated using the CKD-EPI Creatinine Equation (2021)    Anion gap 11 5 - 15    Comment: Performed at [facility], [address] [place], [place] [address]  Magnesium     Status: None   Collection Time: 07/05/20  3:36 AM  Result Value Ref Range   Magnesium 1.9 1.7 - 2.4 mg/dL    Comment: Performed at [facility], [address] [place], [place] [address]  CBC WITH DIFFERENTIAL     Status: Abnormal   Collection Time: 07/05/20  3:36 AM  Result Value Ref Range   WBC 11.9 (H) 4.0 - 10.5 K/uL   RBC 3.78 (L) 3.87 - 5.11 MIL/uL   Hemoglobin 11.0 (L) 12.0 - 15.0 g/dL   HCT 34.0 (L) 36.0 - 46.0 %   MCV 89.9 80.0 - 100.0 fL   MCH 29.1 26.0 - 34.0 pg   MCHC 32.4 30.0 - 36.0 g/dL   RDW 14.3 11.5 - 15.5 %   Platelets 258 150 - 400 K/uL   nRBC 0.0 0.0 - 0.2 %   Neutrophils Relative % 66 %   Neutro Abs 7.8 (H) 1.7 - 7.7 K/uL   Lymphocytes Relative 26 %   Lymphs Abs 3.1 0.7 - 4.0 K/uL   Monocytes Relative 7 %   Monocytes Absolute 0.8 0.1 - 1.0 K/uL   Eosinophils Relative 1 %   Eosinophils Absolute 0.1 0.0 - 0.5 K/uL   Basophils Relative 0 %   Basophils Absolute 0.0 0.0 - 0.1 K/uL   Immature Granulocytes 0 %   Abs Immature Granulocytes 0.03 0.00 -  0.07 K/uL    Comment: Performed at [facility], [address] [place], [place] [address]  CBC     Status: Abnormal   Collection Time: 07/06/20  6:38 AM  Result Value Ref Range   WBC 10.0 4.0 - 10.5 K/uL   RBC 3.47 (L) 3.87 - 5.11 MIL/uL   Hemoglobin 10.1 (L) 12.0 - 15.0 g/dL   HCT 30.9 (L) 36.0 - 46.0 %   MCV 89.0 80.0 - 100.0 fL   MCH 29.1 26.0 - 34.0 pg   MCHC 32.7 30.0 - 36.0 g/dL   RDW 13.9 11.5 - 15.5 %   Platelets 276 150 - 400 K/uL   nRBC 0.0 0.0 - 0.2 %    Comment: Performed at [facility], [address] [place], [place] [address]  Basic metabolic panel     Status: Abnormal   Collection Time: 07/06/20  6:38 AM  Result Value Ref Range   Sodium 138 135 - 145 mmol/L   Potassium 3.3 (L) 3.5 - 5.1 mmol/L   Chloride 106 98 - 111 mmol/L   CO2 24 22 - 32 mmol/L   Glucose, Bld 128 (H) 70 - 99 mg/dL    Comment: Glucose reference range applies only to samples taken after fasting for at least 8 hours.   BUN 8 6 - 20 mg/dL   Creatinine, Ser 0.82 0.44 - 1.00 mg/dL   Calcium 8.5 (L) 8.9 - 10.3 mg/dL   GFR, Estimated >60 >60 mL/min    Comment: (NOTE) Calculated using the CKD-EPI Creatinine Equation (2021)    Anion gap 8 5 - 15    Comment: Performed at [facility], [address]  [place], [place] [address]  Basic metabolic panel     Status: None   Collection Time: 07/10/20 12:49 PM  Result Value Ref Range   Sodium 140 135 - 145 mEq/L   Potassium 4.1 3.5 - 5.1 mEq/L   Chloride 105 96 - 112 mEq/L   CO2 27 19 - 32 mEq/L   Glucose, Bld 87 70 - 99 mg/dL   BUN 9 6 - 23 mg/dL   Creatinine, Ser 0.67 0.40 - 1.20 mg/dL   GFR 102.26 >60.00 mL/min    Comment: Calculated using the CKD-EPI Creatinine Equation (2021)   Calcium 9.3 8.4 - 10.5 mg/dL   Objective  Body mass index is 37.27 kg/m. Wt Readings from Last 3 Encounters:  08/13/20 203 lb 12 oz (92.4 kg)  07/21/20 202 lb 6.4 oz (91.8 kg)  07/15/20 207 lb 3.2 oz (94 kg)   Temp Readings from  Last 3 Encounters:  08/13/20 97.8 F (36.6 C)  07/21/20 98.1 F (36.7 C) (Oral)  07/15/20 98.3 F (36.8 C) (Oral)   BP Readings from Last 3 Encounters:  08/13/20 118/70  07/21/20 (!) 141/87  07/15/20 138/78   Pulse Readings from Last 3 Encounters:  08/13/20 89  07/21/20 78  07/15/20 82    Physical Exam Vitals and nursing note reviewed. Exam conducted with a chaperone present.  Constitutional:      Appearance: Normal appearance. She is well-developed and well-groomed. She is obese.  HENT:     Head: Normocephalic and atraumatic.  Eyes:     Conjunctiva/sclera: Conjunctivae normal.     Pupils: Pupils are equal, round, and reactive to light.  Cardiovascular:     Rate and Rhythm: Normal rate and regular rhythm.     Heart sounds: Normal heart sounds. No murmur heard.   Pulmonary:     Effort: Pulmonary effort is normal.     Breath sounds: Normal breath sounds.  Abdominal:     Tenderness: There is abdominal tenderness in the left lower quadrant.    Genitourinary:    Pubic Area: No rash.      Labia:        Right: No rash.        Left: No rash.      Vagina: Normal.     Cervix: Discharge present.     Uterus: Normal.      Comments: Bloody discharge from cervix Skin:    General: Skin is warm and dry.  Neurological:     General: No focal deficit present.     Mental Status: She is alert and oriented to person, place, and time. Mental status is at baseline.     Gait: Gait normal.  Psychiatric:        Attention and Perception: Attention and perception normal.        Mood and Affect: Mood and affect normal.        Speech: Speech normal.        Behavior: Behavior normal. Behavior is cooperative.        Thought Content: Thought content normal.        Cognition and Memory: Cognition and memory normal.        Judgment: Judgment normal.     Assessment  Plan  Vaginal bleeding - Plan: Urinalysis, Routine w reflex microscopic, Urine Culture, Ambulatory referral to Obstetrics  / Gynecology, Cervicovaginal ancillary only( [place]) ROI sent [facility]  Hematuria, unspecified type - Plan: Urinalysis, Routine w reflex microscopic, Urine Culture  Insomnia, unspecified type -  Plan: traZODone (DESYREL) 100 MG tablet  Lower abdominal pain - Plan: Ambulatory referral to Obstetrics / Gynecology, Cervicovaginal ancillary only( [place]) Gastroesophageal reflux disease without esophagitis  F/u with GI Dr. Allen Norris 09/08/20  Wants referral surgery 2nd opinion [facility] Dr. Miquel Dunn See HPI Disc pepcid, prilosec, nexium otc for stomach burning likely GERD sx's   Provider: Dr. Olivia Mackie McLean-Scocuzza-Internal Medicine

## 2020-08-14 ENCOUNTER — Encounter: Payer: Self-pay | Admitting: Family

## 2020-08-14 ENCOUNTER — Telehealth (INDEPENDENT_AMBULATORY_CARE_PROVIDER_SITE_OTHER): Payer: 59 | Admitting: Family

## 2020-08-14 ENCOUNTER — Other Ambulatory Visit (HOSPITAL_COMMUNITY): Payer: Self-pay | Admitting: Internal Medicine

## 2020-08-14 ENCOUNTER — Other Ambulatory Visit: Payer: Self-pay | Admitting: Internal Medicine

## 2020-08-14 ENCOUNTER — Telehealth: Payer: Self-pay | Admitting: Family

## 2020-08-14 VITALS — Ht 62.5 in | Wt 203.0 lb

## 2020-08-14 DIAGNOSIS — B373 Candidiasis of vulva and vagina: Secondary | ICD-10-CM

## 2020-08-14 DIAGNOSIS — N760 Acute vaginitis: Secondary | ICD-10-CM | POA: Diagnosis not present

## 2020-08-14 DIAGNOSIS — B9689 Other specified bacterial agents as the cause of diseases classified elsewhere: Secondary | ICD-10-CM

## 2020-08-14 DIAGNOSIS — R1011 Right upper quadrant pain: Secondary | ICD-10-CM | POA: Diagnosis not present

## 2020-08-14 DIAGNOSIS — B3731 Acute candidiasis of vulva and vagina: Secondary | ICD-10-CM | POA: Insufficient documentation

## 2020-08-14 LAB — URINALYSIS, ROUTINE W REFLEX MICROSCOPIC
Bilirubin, UA: NEGATIVE
Glucose, UA: NEGATIVE
Ketones, UA: NEGATIVE
Leukocytes,UA: NEGATIVE
Nitrite, UA: NEGATIVE
Protein,UA: NEGATIVE
Specific Gravity, UA: 1.007 (ref 1.005–1.030)
Urobilinogen, Ur: 0.2 mg/dL (ref 0.2–1.0)
pH, UA: 5.5 (ref 5.0–7.5)

## 2020-08-14 LAB — MICROSCOPIC EXAMINATION
Bacteria, UA: NONE SEEN
Casts: NONE SEEN /lpf
RBC, Urine: NONE SEEN /hpf (ref 0–2)
WBC, UA: NONE SEEN /hpf (ref 0–5)

## 2020-08-14 LAB — CERVICOVAGINAL ANCILLARY ONLY
Bacterial Vaginitis (gardnerella): POSITIVE — AB
Candida Glabrata: NEGATIVE
Candida Vaginitis: NEGATIVE
Comment: NEGATIVE
Comment: NEGATIVE
Comment: NEGATIVE

## 2020-08-14 MED ORDER — FLUCONAZOLE 150 MG PO TABS: 150.0000 mg | ORAL_TABLET | Freq: Once | ORAL | 0 refills | Status: AC

## 2020-08-14 MED ORDER — METRONIDAZOLE 500 MG PO TABS: 500.0000 mg | ORAL_TABLET | Freq: Two times a day (BID) | ORAL | 0 refills | Status: DC

## 2020-08-14 NOTE — Telephone Encounter (Signed)
lft vm for pt to call ofc to [facility] image Thanks

## 2020-08-14 NOTE — Assessment & Plan Note (Addendum)
Overall improved. Differential includes cholelithiasis, GERD, post operative pain. However patient has remote RUQ pain which we question if related to known cholelithiasis. Pending labs, HIDA. She will keep upcoming appointment with Dr Allen Norris.

## 2020-08-14 NOTE — Assessment & Plan Note (Signed)
BV and yeast ; flagyl and diflucan have been sent in by Dr Aundra Dubin.

## 2020-08-14 NOTE — Progress Notes (Signed)
Virtual Visit via Video Note  I connected with@  on 08/14/20 at  9:00 AM EST by a video enabled telemedicine application and verified that I am speaking with the correct person using two identifiers.  Location patient: home Location provider:work  Persons participating in the virtual visit: patient, provider  I discussed the limitations of evaluation and management by telemedicine and the availability of in person appointments. The patient expressed understanding and agreed to proceed.   HPI: Left lower and right upper abdominal pain has improved over 5-6 weeks since surgery. Describes as an ache. She describes stomach gurgling after she eats and drink. No severe or cramping pain of RUQ.   Seen by [facility] yesterday for left lower abdominal pain. No fever, abdominal distension, constipation, nausea, vomiting.  No alcohol use.    She has h/o  Pancreatitis and worries about recurrence.   She also complains of  vaginal bleeding which she also noticed tarted post operatively 5 weeks ago . Describes pink tinge; it is not bright red. Increased vaginal discharge. No vaginal itching, dysuria. She hasnt had period in over a year.   She was seen by Dr Celine Ahr 07/21/20 whom reassured her that expectant course after surgery.  Appendectomy 07/02/20  Appointment scheduled  GYN 09/02/20.  Appt with Dr Allen Norris 09/08/20 Second opinion with [facility] Miquel Dunn at [facility], general surgeon, referral placed by Dr Aundra Dubin She canceled an appt with Dr Celine Ahr  ROS: See pertinent positives and negatives per HPI.    EXAM:  VITALS per patient if applicable: Ht 5' 2.5" (1.588 m)   Wt 203 lb (92.1 kg)   BMI 36.54 kg/m  BP Readings from Last 3 Encounters:  08/13/20 118/70  07/21/20 (!) 141/87  07/15/20 138/78   Wt Readings from Last 3 Encounters:  08/14/20 203 lb (92.1 kg)  08/13/20 203 lb 12 oz (92.4 kg)  07/21/20 202 lb 6.4 oz (91.8 kg)    GENERAL: alert, oriented, appears well and in no acute distress  HEENT:  atraumatic, conjunttiva clear, no obvious abnormalities on inspection of external nose and ears  NECK: normal movements of the head and neck  LUNGS: on inspection no signs of respiratory distress, breathing rate appears normal, no obvious gross SOB, gasping or wheezing  CV: no obvious cyanosis  MS: moves all visible extremities without noticeable abnormality  PSYCH/NEURO: pleasant and cooperative, no obvious depression or anxiety, speech and thought processing grossly intact  ASSESSMENT AND PLAN:  Discussed the following assessment and plan:  Problem List Items Addressed This Visit      Genitourinary   Vaginitis    BV and yeast ; flagyl and diflucan have been sent in by Dr Aundra Dubin.       Yeast vaginitis     Other   Abdominal pain - Primary    Overall improved. Differential includes cholelithiasis, GERD, post operative pain. However patient has remote RUQ pain which we question if related to known cholelithiasis. Pending labs, HIDA. She will keep upcoming appointment with Dr Allen Norris.        Relevant Orders   Comprehensive metabolic panel   Amylase   NM Hepato W/Eject Fract   CBC with Differential/Platelet   Lipase, blood      -we discussed possible serious and likely etiologies, options for evaluation and workup, limitations of telemedicine visit vs in person visit, treatment, treatment risks and precautions. Pt prefers to treat via telemedicine empirically rather then risking or undertaking an in person visit at this moment.  Marland Kitchen  I discussed the assessment and treatment plan with the patient. The patient was provided an opportunity to ask questions and all were answered. The patient agreed with the plan and demonstrated an understanding of the instructions.   The patient was advised to call back or seek an in-person evaluation if the symptoms worsen or if the condition fails to improve as anticipated.   Mable Paris, FNP

## 2020-08-14 NOTE — Patient Instructions (Signed)
Ensure to take probiotics while on antibiotics and also for 2 weeks after completion. This can either be by eating yogurt daily or taking a probiotic supplement over the counter such as Culturelle.It is important to re-colonize the gut with good bacteria and also to prevent any diarrheal infections associated with antibiotic use.    Start   Order HIDA scan

## 2020-08-15 LAB — URINE CULTURE

## 2020-08-17 NOTE — Telephone Encounter (Signed)
Patient returned referrals phone call. 

## 2020-08-17 NOTE — Telephone Encounter (Signed)
Thank you :)

## 2020-08-25 ENCOUNTER — Ambulatory Visit: Payer: 59 | Admitting: General Surgery

## 2020-09-08 ENCOUNTER — Encounter: Payer: Self-pay | Admitting: Gastroenterology

## 2020-09-08 ENCOUNTER — Ambulatory Visit (INDEPENDENT_AMBULATORY_CARE_PROVIDER_SITE_OTHER): Payer: 59 | Admitting: Gastroenterology

## 2020-09-08 ENCOUNTER — Other Ambulatory Visit: Payer: Self-pay

## 2020-09-08 ENCOUNTER — Ambulatory Visit: Payer: 59 | Attending: Family

## 2020-09-08 VITALS — BP 137/73 | HR 92 | Ht 62.0 in | Wt 208.4 lb

## 2020-09-08 DIAGNOSIS — K760 Fatty (change of) liver, not elsewhere classified: Secondary | ICD-10-CM

## 2020-09-08 NOTE — Progress Notes (Signed)
Gastroenterology Consultation  Referring Provider:     Burnard Hawthorne, FNP Primary Care Physician:  Burnard Hawthorne, FNP Primary Gastroenterologist:  Dr. Allen Norris     Reason for Consultation:     Fatty liver        HPI:   [name] is a 50 y.o. y/o female referred for consultation & management of fatty liver by Dr. Vidal Schwalbe, Yvetta Coder, FNP.  This patient comes here today for a history of having a abdominal ultrasound in February of this year that showed her to have gallstones without any evidence of cholecystitis and mild increased echogenicity of the liver suggestive of hepatic steatosis.  The patient had a CT scan in the end of January that showed hepatomegaly.  The patient was diagnosed with an acute appendicitis on January 27 and underwent laparoscopic surgery.  The patient's liver enzymes have shown:  Component     Latest Ref Rng & Units 03/27/2020 07/02/2020 07/04/2020          12:25 AM  AST     15 - 41 U/L 17 23 29   ALT     0 - 44 U/L 27 32 24  Alkaline Phosphatase     38 - 126 U/L 85 83 68  Total Bilirubin     0.3 - 1.2 mg/dL 0.4 0.7 0.8   The patient's blood work from January 31 of this year showed her hemoglobin at 10.1 which was down from 13.1 two days prior. The patient reports that she has some burning pain in the right lower abdomen near where her surgery was.  There is no report of any black stools bloody stools nausea vomiting fevers or chills.   Past Medical History:  Diagnosis Date  . Akathisia 12/17/2014  . Bipolar affective ([place])   . Restless leg syndrome   . Schizoaffective disorder ([place])   . Schizoaffective disorder [facility])     Past Surgical History:  Procedure Laterality Date  . CESAREAN SECTION    . LAPAROSCOPIC APPENDECTOMY N/A 07/02/2020   Procedure: APPENDECTOMY LAPAROSCOPIC;  Surgeon: Fredirick Maudlin, MD;  Location: [facility] ORS;  Service: General;  Laterality: N/A;    Prior to Admission medications   Medication Sig Start Date End Date  Taking? Authorizing Provider  acetaminophen (TYLENOL) 500 MG tablet Take 2 tablets (1,000 mg total) by mouth every 6 (six) hours. Patient not taking: No sig reported 07/02/20   Fredirick Maudlin, MD  COVID-19 mRNA Vac-TriS, Pfizer, SUSP injection USE AS DIRECTED 07/28/20 07/28/21  Carlyle Basques, MD  ibuprofen (ADVIL) 800 MG tablet Take 1 tablet (800 mg total) by mouth every 8 (eight) hours as needed. Patient not taking: No sig reported 07/02/20   Fredirick Maudlin, MD  metroNIDAZOLE (FLAGYL) 500 MG tablet Take 1 tablet (500 mg total) by mouth 2 (two) times daily. With food 08/14/20   McLean-Scocuzza, Nino Glow, MD  metroNIDAZOLE (FLAGYL) 500 MG tablet TAKE 1 TABLET BY MOUTH TWICE DAILY WITH FOOD 08/14/20 08/14/21  McLean-Scocuzza, Nino Glow, MD  ondansetron (ZOFRAN-ODT) 4 MG disintegrating tablet Take 1 tablet (4 mg total) by mouth every 6 (six) hours as needed for nausea. Patient not taking: No sig reported 07/02/20   Fredirick Maudlin, MD  polyethylene glycol (MIRALAX / GLYCOLAX) 17 g packet Take 17 g by mouth daily as needed for mild constipation. Patient not taking: No sig reported 07/06/20   Tylene Fantasia, PA-C  traZODone (DESYREL) 100 MG tablet Take 1 tablet (100 mg total) by mouth at bedtime  as needed. 08/13/20   McLean-Scocuzza, Nino Glow, MD  traZODone (DESYREL) 100 MG tablet TAKE 1 TABLET BY MOUTH AT BEDTIME AS NEEDED. 08/13/20 08/13/21  McLean-Scocuzza, Nino Glow, MD    Family History  Problem Relation Age of Onset  . Drug abuse Maternal Aunt      Social History   Tobacco Use  . Smoking status: Current Every Day Smoker    Packs/day: 0.50    Years: 10.00    Pack years: 5.00    Types: Cigarettes  . Smokeless tobacco: Never Used  Vaping Use  . Vaping Use: Former  Substance Use Topics  . Alcohol use: No  . Drug use: No    Allergies as of 09/08/2020  . (No Known Allergies)    Review of Systems:    All systems reviewed and negative except where noted in HPI.   Physical Exam:  There  were no vitals taken for this visit. No LMP recorded. Patient is perimenopausal. General:   Alert,  Well-developed, well-nourished, pleasant and cooperative in NAD Head:  Normocephalic and atraumatic. Eyes:  Sclera clear, no icterus.   Conjunctiva pink. Ears:  Normal auditory acuity. Neck:  Supple; no masses or thyromegaly. Lungs:  Respirations even and unlabored.  Clear throughout to auscultation.   No wheezes, crackles, or rhonchi. No acute distress. Heart:  Regular rate and rhythm; no murmurs, clicks, rubs, or gallops. Abdomen:  Normal bowel sounds.  No bruits.  Soft, non-tender and non-distended without masses, hepatosplenomegaly or hernias noted.  No guarding or rebound tenderness.  Positive Carnett sign.   Rectal:  Deferred.  Pulses:  Normal pulses noted. Extremities:  No clubbing or edema.  No cyanosis. Neurologic:  Alert and oriented x3;  grossly normal neurologically. Skin:  Intact without significant lesions or rashes.  No jaundice. Lymph Nodes:  No significant cervical adenopathy. Psych:  Alert and cooperative. Normal mood and affect.  Imaging Studies: No results found.  Assessment and Plan:   [name] is a 50 y.o. y/o female who comes in today with a history of right lower quadrant pain that is clearly musculoskeletal and who was found to have fatty liver with an enlarged liver on imaging. Despite having steatosis the patient does not appear to have steatohepatitis since her liver enzymes have been historically normal.  The patient has been told to start a Mediterranean diet which she states she has tried in the past and likes that diet.  She has also been told that a 7% weight loss would help decrease the amount of fat in her liver.  The patient will also be set up for a screening colonoscopy.  The patient has been explained the plan and agrees with it.    [name], MD. Marval Regal    Note: This dictation was prepared with Dragon dictation along with smaller phrase  technology. Any transcriptional errors that result from this process are unintentional.

## 2020-09-28 ENCOUNTER — Encounter: Payer: Self-pay | Admitting: Family

## 2020-09-30 ENCOUNTER — Encounter: Payer: Self-pay | Admitting: Family

## 2020-09-30 ENCOUNTER — Telehealth (INDEPENDENT_AMBULATORY_CARE_PROVIDER_SITE_OTHER): Payer: 59 | Admitting: Family

## 2020-09-30 ENCOUNTER — Telehealth: Payer: Self-pay | Admitting: Family

## 2020-09-30 VITALS — Ht 62.0 in

## 2020-09-30 DIAGNOSIS — F319 Bipolar disorder, unspecified: Secondary | ICD-10-CM | POA: Diagnosis not present

## 2020-09-30 NOTE — Assessment & Plan Note (Signed)
Long discussion with patient and daughter , Loma Sousa today and patient has not endorsed any thoughts of hurting herself or anyone else. Patient is appropriate over our virtual visit today. She has recent health stressors, concern over finances and stressful relationship with Freight forwarder. She has family support at home.  We jointly agreed that in setting of h/o bipolar, we need to re-establish with psychiatry asap. I have placed urgent referral and sent message to Dr Adele Schilder. Patient will also look into STD and bring paperwork to the office. She had last been on Abilify. Follow up in 2 weeks.

## 2020-09-30 NOTE — Telephone Encounter (Signed)
Patient called in stated that she can not go back to work

## 2020-09-30 NOTE — Progress Notes (Signed)
Virtual Visit via Video Note  I connected with@  on 09/30/20 at 12:00 PM EDT by a video enabled telemedicine application and verified that I am speaking with the correct person using two identifiers.  Location patient: home Location provider:work  Persons participating in the virtual visit: patient, provider  I discussed the limitations of evaluation and management by telemedicine and the availability of in person appointments. The patient expressed understanding and agreed to proceed.   HPI: Accompanied by daughter, Loma Sousa She noticed 5 days ago that she wasn't sleeping well.She feels overwhelmed, tearful . She describes her mother as mind is racing.  She had not gone to work this week. She is frustrated by management at work. She is worried about finances.  She slept well last night. She has no thoughts of hurting herself or hurting anyone else.   She has h/o bipolar and followed with Dr Adele Schilder in the past.  She is compliant with trazodone 100mg .   Last seen by Dr Adele Schilder 01/2019  For GAD, bipolar- she had been on cogentin, abilify in the past.   ROS: See pertinent positives and negatives per HPI.    EXAM:  VITALS per patient if applicable: Ht 5\' 2"  (1.575 m)   BMI 38.12 kg/m  BP Readings from Last 3 Encounters:  09/08/20 137/73  08/13/20 118/70  07/21/20 (!) 141/87   Wt Readings from Last 3 Encounters:  09/08/20 208 lb 6.4 oz (94.5 kg)  08/14/20 203 lb (92.1 kg)  08/13/20 203 lb 12 oz (92.4 kg)    GENERAL: alert, oriented, appears well and in no acute distress. Speaking in full complete and understandable sentences. She is tearful.Appropriately dressed.    HEENT: atraumatic, conjunttiva clear, no obvious abnormalities on inspection of external nose and ears  NECK: normal movements of the head and neck  LUNGS: on inspection no signs of respiratory distress, breathing rate appears normal, no obvious gross SOB, gasping or wheezing  CV: no obvious cyanosis  MS:  moves all visible extremities without noticeable abnormality  PSYCH/NEURO: pleasant and cooperative, no obvious depression or anxiety, speech and thought processing grossly intact  ASSESSMENT AND PLAN:  Discussed the following assessment and plan:  Problem List Items Addressed This Visit      Other   Bipolar affective ([place]) - Primary    Long discussion with patient and daughter , Loma Sousa today and patient has not endorsed any thoughts of hurting herself or anyone else. Patient is appropriate over our virtual visit today. She has recent health stressors, concern over finances and stressful relationship with Freight forwarder. She has family support at home.  We jointly agreed that in setting of h/o bipolar, we need to re-establish with psychiatry asap. I have placed urgent referral and sent message to Dr Adele Schilder. Patient will also look into STD and bring paperwork to the office. She had last been on Abilify. Follow up in 2 weeks.       Relevant Orders   Ambulatory referral to Psychiatry      -we discussed possible serious and likely etiologies, options for evaluation and workup, limitations of telemedicine visit vs in person visit, treatment, treatment risks and precautions. Pt prefers to treat via telemedicine empirically rather then risking or undertaking an in person visit at this moment.  .   I discussed the assessment and treatment plan with the patient. The patient was provided an opportunity to ask questions and all were answered. The patient agreed with the plan and demonstrated an understanding of the instructions.  The patient was advised to call back or seek an in-person evaluation if the symptoms worsen or if the condition fails to improve as anticipated.   Mable Paris, FNP

## 2020-10-01 ENCOUNTER — Other Ambulatory Visit: Payer: Self-pay

## 2020-10-01 ENCOUNTER — Emergency Department
Admission: EM | Admit: 2020-10-01 | Discharge: 2020-10-02 | Disposition: A | Discharge status: Home / Self Care | Payer: 59 | Attending: Emergency Medicine | Admitting: Emergency Medicine

## 2020-10-01 ENCOUNTER — Encounter: Payer: Self-pay | Admitting: Family

## 2020-10-01 ENCOUNTER — Encounter: Payer: Self-pay | Admitting: Emergency Medicine

## 2020-10-01 DIAGNOSIS — F17210 Nicotine dependence, cigarettes, uncomplicated: Secondary | ICD-10-CM | POA: Insufficient documentation

## 2020-10-01 DIAGNOSIS — Z760 Encounter for issue of repeat prescription: Secondary | ICD-10-CM | POA: Insufficient documentation

## 2020-10-01 DIAGNOSIS — Z0000 Encounter for general adult medical examination without abnormal findings: Secondary | ICD-10-CM

## 2020-10-01 DIAGNOSIS — F419 Anxiety disorder, unspecified: Secondary | ICD-10-CM | POA: Diagnosis not present

## 2020-10-01 DIAGNOSIS — A419 Sepsis, unspecified organism: Secondary | ICD-10-CM | POA: Diagnosis present

## 2020-10-01 DIAGNOSIS — F319 Bipolar disorder, unspecified: Secondary | ICD-10-CM | POA: Diagnosis present

## 2020-10-01 DIAGNOSIS — F310 Bipolar disorder, current episode hypomanic: Secondary | ICD-10-CM | POA: Diagnosis not present

## 2020-10-01 NOTE — Telephone Encounter (Signed)
Patient;s daughter called in stated that the referral that [facility] sent them to is not taking any new patients and would like to get an appointment to the [facility] behavioral health [phone]

## 2020-10-01 NOTE — ED Triage Notes (Signed)
Pt in via POV, states, "I have an appointment with my psychiatrist on May 11 but I need some medication before then."  Patient rambling in triage, reports having trouble concentrating, being unable to work this past week due to "I have some things going on."    Pt reports being on Abilify in the past but has been off of it since Covid.  Denies any SI/HI.  Vitals WDL, NAD noted.

## 2020-10-02 ENCOUNTER — Other Ambulatory Visit: Payer: Self-pay

## 2020-10-02 ENCOUNTER — Ambulatory Visit (HOSPITAL_COMMUNITY)
Admission: EM | Admit: 2020-10-02 | Discharge: 2020-10-02 | Disposition: A | Discharge status: Home / Self Care | Payer: 59

## 2020-10-02 DIAGNOSIS — F310 Bipolar disorder, current episode hypomanic: Secondary | ICD-10-CM | POA: Diagnosis not present

## 2020-10-02 DIAGNOSIS — F419 Anxiety disorder, unspecified: Secondary | ICD-10-CM | POA: Diagnosis not present

## 2020-10-02 DIAGNOSIS — F319 Bipolar disorder, unspecified: Secondary | ICD-10-CM | POA: Diagnosis not present

## 2020-10-02 DIAGNOSIS — Z760 Encounter for issue of repeat prescription: Secondary | ICD-10-CM | POA: Diagnosis not present

## 2020-10-02 DIAGNOSIS — F17210 Nicotine dependence, cigarettes, uncomplicated: Secondary | ICD-10-CM | POA: Diagnosis not present

## 2020-10-02 MED ORDER — ARIPIPRAZOLE 10 MG PO TABS
10.0000 mg | ORAL_TABLET | ORAL | Status: AC
  Administered 2020-10-02: 10 mg via ORAL
  Filled 2020-10-02 (×2): qty 1

## 2020-10-02 MED ORDER — ARIPIPRAZOLE 10 MG PO TABS: 10.0000 mg | ORAL_TABLET | Freq: Every day | ORAL | 0 refills | Status: DC

## 2020-10-02 NOTE — BH Assessment (Signed)
TTS triage: Patient presents to [facility] with her daughter, [name], requesting an evaluation. Patient states she feels she is approaching a manic episode. She states she is diagnosed with schizoaffective disorder, bipolar type and has been noncompliant with medications for 2 years. Pt accessed ED last night. She denies SI/HI/AVH. Patient's daughter reports that patient has expressed paranoia, is delusional, not sleeping, and has a history of hospitalizations.  Patient is URGENT

## 2020-10-02 NOTE — Telephone Encounter (Signed)
LMTCB

## 2020-10-02 NOTE — Consult Note (Signed)
[place] [facility] Consult   Reason for Consult: Medication Refill Referring Physician: Dr. Karma Greaser Patient Identification: [name] MRN:  [id number] Principal Diagnosis: <principal problem not specified> Diagnosis:  Active Problems:   Encounter for medical examination to establish care   Bipolar affective ([place])   Sepsis ([place])   Total Time spent with patient: 20 minutes  Subjective:   [name] is a 50 y.o. female patient presented to [facility] ED via [place] with her family. The patient came in requesting to be placed back on her Abilify medication. She states she has a history of bipolar and schizoaffective disorders. The patient voiced that she has been noticeable "off my game since I have not taken my medication."  The patient states she has an appointment with her psychiatrist on 05.11.22, and she needs something to hold her off until she is seen. The patient states she took off from work, and now she wants to return to work on Monday 05.02.22. Dr. Karma Greaser agrees to prescribe the patient medication until she meets with her psychiatrist.   The patient was seen face-to-face by this provider; the chart was reviewed and consulted with Dr. Karma Greaser on 10/01/2020 due to the patient's care. EDP discussed that the patient does not meet the criteria to be admitted to the psychiatric inpatient unit.  On evaluation, the patient is alert and oriented x 4, anxious but cooperative, and mood-congruent with affect. The patient does not appear to be responding to internal or external stimuli. Neither is the patient presenting with any delusional thinking. The patient denies auditory or visual hallucinations. The patient denies any suicidal, homicidal, or self-harm ideations. The patient is not presenting with any psychotic or paranoid behaviors. During an encounter with the patient, she was able to answer questions appropriately.   HPI: Per Dr. Karma Greaser, [name] is a 50 y.o. female with  medical and psychiatric history as listed below who presents for medication refill.  She said that for couple weeks she has been gradually worsening in terms of her anxiety, difficulty sleeping, difficulty coping with things at work, etc.  She has been in frequent communication with her primary care doctor to be referred back to a psychiatrist that she saw previously in [place].  She found out today that she does have an appointment but it is not for another 2 weeks.  She is afraid that she will not be able to make it that long and she very much wants to return to work.  She tried going to [facility] today but said there was some miscommunications about what time she should show up and that she was not able to get any help with her today.  Her family recommended that she come in tonight to see if she can get back on her Abilify which she took previously and which helped her manage her symptoms.  She adamantly denies suicidal ideation and homicidal ideation and she wants help.  She has not had any recent medical complaints or concerns including denying fever, sore throat, nausea, vomiting, and abdominal pain.    Past Psychiatric History:  Bipolar affective ([place]) Schizoaffective disorder ([place]) Restless leg syndrome   Risk to Self:   Risk to Others:   Prior Inpatient Therapy:   Prior Outpatient Therapy:    Past Medical History:  Past Medical History:  Diagnosis Date  . Akathisia 12/17/2014  . Bipolar affective ([place])   . Restless leg syndrome   . Schizoaffective disorder ([place])   . Schizoaffective disorder (  [facility])     Past Surgical History:  Procedure Laterality Date  . CESAREAN SECTION    . LAPAROSCOPIC APPENDECTOMY N/A 07/02/2020   Procedure: APPENDECTOMY LAPAROSCOPIC;  Surgeon: Fredirick Maudlin, MD;  Location: [facility] ORS;  Service: General;  Laterality: N/A;   Family History:  Family History  Problem Relation Age of Onset  . Drug abuse Maternal Aunt    Family Psychiatric  History:  Social  History:  Social History   Substance and Sexual Activity  Alcohol Use No     Social History   Substance and Sexual Activity  Drug Use No    Social History   Socioeconomic History  . Marital status: Married    Spouse name: [name]  . Number of children: 2  . Years of education: 63  . Highest education level: Some college, no degree  Occupational History  . Not on file  Tobacco Use  . Smoking status: Current Every Day Smoker    Packs/day: 0.50    Years: 10.00    Pack years: 5.00    Types: Cigarettes  . Smokeless tobacco: Never Used  Vaping Use  . Vaping Use: Former  Substance and Sexual Activity  . Alcohol use: No  . Drug use: No  . Sexual activity: Yes  Other Topics Concern  . 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  No current facility-administered medications for this encounter.   Current Outpatient Medications  Medication Sig Dispense Refill  . ARIPiprazole (ABILIFY) 10 MG tablet Take 1 tablet (10 mg total) by mouth daily. 30 tablet 0  . COVID-19 mRNA Vac-TriS, Pfizer, SUSP injection USE AS DIRECTED (Patient not taking: No sig reported) .3 mL 0  . metroNIDAZOLE (FLAGYL) 500 MG tablet Take 1 tablet (500 mg total) by mouth 2 (two) times daily. With food (Patient not taking: No sig reported) 14 tablet 0  . ondansetron (ZOFRAN-ODT) 4 MG disintegrating tablet Take 1 tablet (4 mg total) by mouth every 6 (six) hours as needed for nausea. (Patient not taking: No sig reported) 20 tablet 0  . polyethylene  glycol (MIRALAX / GLYCOLAX) 17 g packet Take 17 g by mouth daily as needed for mild constipation. (Patient not taking: No sig reported) 14 each 0  . traZODone (DESYREL) 100 MG tablet Take 1 tablet (100 mg total) by mouth at bedtime as needed. 90 tablet 3    Musculoskeletal: Strength & Muscle Tone: within normal limits Gait & Station: normal Patient leans: Right Psychiatric Specialty Exam:  Presentation  General Appearance: Appropriate for Environment  Eye Contact:Good  Speech:Clear and Coherent  Speech Volume:Normal  Handedness:Right  Mood and Affect  Mood:Anxious; Depressed  Affect:Appropriate; Congruent  Thought Process  Thought Processes:Coherent  Descriptions of Associations:Intact  Orientation:Full (Time, Place and Person)  Thought Content:Computation; Logical  History of Schizophrenia/Schizoaffective disorder:No data recorded Duration of Psychotic Symptoms:No data recorded Hallucinations:Hallucinations: Auditory; Visual  Ideas of Reference:Delusions  Suicidal Thoughts:Suicidal Thoughts: No  Homicidal  Thoughts:Homicidal Thoughts: No   Sensorium  Memory:Immediate Fair; Recent Fair; Remote Fair  Judgment:Good  Insight:Good   Executive Functions  Concentration:Good  Attention Span:Good  [place]  Language:Good   Psychomotor Activity  Psychomotor Activity:No data recorded  Assets  Assets:Communication Skills; Desire for Improvement; Physical Health; Resilience; Social Support; Talents/Skills   Sleep  Sleep:Sleep: Good   Physical Exam: Physical Exam Constitutional:      Appearance: Normal appearance.  HENT:     Head: Normocephalic.     Nose: Nose normal.     Mouth/Throat:     Mouth: Mucous membranes are moist.  Cardiovascular:     Rate and Rhythm: Normal rate.     Pulses: Normal pulses.  Pulmonary:     Effort: Pulmonary effort is normal.  Musculoskeletal:        General: Normal range of motion.      Cervical back: Normal range of motion.  Skin:    General: Skin is warm.  Neurological:     General: No focal deficit present.     Mental Status: She is oriented to person, place, and time.  Psychiatric:        Attention and Perception: Attention and perception normal.        Mood and Affect: Mood and affect normal.        Speech: Speech normal.        Behavior: Behavior normal. Behavior is cooperative.        Thought Content: Thought content normal.        Cognition and Memory: Cognition normal.        Judgment: Judgment normal.    ROS Blood pressure (!) 143/77, pulse 87, resp. rate 16, height 5\' 2"  (1.575 m), weight 90.7 kg, SpO2 99 %. Body mass index is 36.58 kg/m.  Treatment Plan Summary: Plan The patient does not meet criteria for psychiatric inpatient admission.  Disposition: No evidence of imminent risk to self or others at present.   Patient does not meet criteria for psychiatric inpatient admission. Supportive therapy provided about ongoing stressors.  Caroline Sauger, NP 10/02/2020 3:38 AM

## 2020-10-02 NOTE — Discharge Instructions (Addendum)
You have been seen in the Emergency Department (ED) today for a psychiatric complaint.  You have been evaluated by psychiatry and the ED physician, and we believe you are safe to be discharged from the hospital.    Please return to the ED immediately if you have ANY thoughts of hurting yourself or anyone else, so that we may help you.  Please avoid alcohol and drug use.  Follow up with your doctor and/or therapist as soon as possible regarding today's ED visit.   Please follow up any other recommendations and clinic appointments provided by the psychiatry team that saw you in the Emergency Department.

## 2020-10-02 NOTE — Discharge Instructions (Addendum)
You have been referred to the [facility] Intensive Outpatient Program (IOP).  If you don't hear from program director, Velva Harman, by 10am on Monday, please contact the office and ask to speak with her.  [facility]  [address] [place], [place], [place] [address] Phone: [phone]  Patient is instructed prior to discharge to: Take all medications as prescribed by his/her mental healthcare provider. Report any adverse effects and or reactions from the medicines to his/her outpatient provider promptly. Keep all scheduled appointments, to ensure that you are getting refills on time and to avoid any interruption in your medication.  If you are unable to keep an appointment call to reschedule.  Be sure to follow-up with resources and follow-up appointments provided.  Patient has been instructed & cautioned: To not engage in alcohol and or illegal drug use while on prescription medicines. In the event of worsening symptoms, patient is instructed to call the crisis hotline, 911 and or go to the nearest ED for appropriate evaluation and treatment of symptoms. To follow-up with his/her primary care provider for your other medical issues, concerns and or health care needs.

## 2020-10-02 NOTE — BH Assessment (Signed)
Comprehensive Clinical Assessment (CCA) Note  10/02/2020 [name] [id number]   Disposition: Per Beatriz Stallion, NP patient does not meet inpatient criteria.  Intensive Outpatient Treatment is recommended.  Patient has been referred to [facility] IOP program.   The patient demonstrates the following risk factors for suicide: Chronic risk factors for suicide include: psychiatric disorder of Bipolar Disorder. Acute risk factors for suicide include: family or marital conflict and social withdrawal/isolation. Protective factors for this patient include: positive social support, responsibility to others (children, family), coping skills and life satisfaction. Considering these factors, the overall suicide risk at this point appears to be low. Patient is appropriate for outpatient follow up.  Patient is a 50 year old female with a history of Bipolar Disorder who presents voluntarily with her daughter to [facility] for assessment.  Patient was just seen at [facility] last night for worsening mental health symptoms, which family feel are signs patient is experiencing another manic episode.  They are noticing patient has racing thoughts and "big plans," along with paranoia.  Patient has had periods of stability on medications, however she will then opt to discontinue to "manage it by myself."  Patient has been off medications for over two years, after she made the decision at that time to discontinue.  Patient's daughter is concerned that the Abilify patient was prescribed last night at [facility] "isn't enough."  She shared that she is seeing signs that patient is beginning to decompensate and may be experiencing onset of a manic episode. Patient's daughter is tearful as she describes past episodes during which patient typically begins to experience SI, feeling she is a burden on the family.  I don't want to need medicine to live my life.  I want to be normal."    Patient is resistant to treatment  recommendation for referral to IOP with [facility].  She is focused on getting back to work and not leaving so much on her co-worker.  Patient does appear to be experiencing manic symptoms that could worsen without intervention.  Speech is pressured, thought process is tangential and patient is beginning to experience paranoia.  She has been discussing some "financial plan" involving scanning all important documents. She is very focused on this plan, however her husband was not open to hearing the many details.  This ws upsetting for patient and she is displaying limited insight into her current symptoms.  She is denying SI, HI, AVH.or SA history.  She is experiencing mild paranoia.  Patient has had difficulty sleeping, however has taken her mother's xanax to help with sleep the last two nights.  Patient's daughter is an Therapist, sports and is quite supportive of patient.  She has arranged for patient to stay with her mother during the day, as she is concerned about patient being alone at this point.  This has been upsetting for patient, especially with her limited insight.  Ultimately, patient has expressed that she prefers to return to work on Monday, however agrees to take Monday off to focus on setting up IOP treatment.  She states she will consider starting IOP, especially if she is able to get leave time approved.      Chief Complaint:  Chief Complaint  Patient presents with  . Urgent Emergent Evaluation   Visit Diagnosis: Bipolar Disorder   CCA Screening, Triage and Referral (STR)  Patient Reported Information How did you hear about [place]? Family/Friend  Referral name: No data recorded Referral phone number: No data recorded  Whom do you see for routine medical problems? No data recorded Practice/Facility Name: No data recorded Practice/Facility Phone Number: No data recorded Name of Contact: No data recorded Contact Number: No data recorded Contact Fax Number: No data recorded Prescriber Name: No  data recorded Prescriber Address (if known): No data recorded  What Is the Reason for Your Visit/Call Today? potential  How Long Has This Been Causing You Problems? > than 6 months  What Do You Feel Would Help You the Most Today? Treatment for Depression or other mood problem   Have You Recently Been in Any Inpatient Treatment (Hospital/Detox/Crisis Center/28-Day Program)? No  Name/Location of Program/Hospital:No data recorded How Long Were You There? No data recorded When Were You Discharged? No data recorded  Have You Ever Received Services From [facility] Before? Yes  Who Do You See at [facility]? ED visits   Have You Recently Had Any Thoughts About Hurting Yourself? No  Are You Planning to Commit Suicide/Harm Yourself At This time? No   Have you Recently Had Thoughts About [place]? No  Explanation: No data recorded  Have You Used Any Alcohol or Drugs in the Past 24 Hours? Yes  How Long Ago Did You Use Drugs or Alcohol? No data recorded What Did You Use and How Much? took mother's xanax "last two nights to help with sleep."   Do You Currently Have a Therapist/Psychiatrist? Yes  Name of Therapist/Psychiatrist: Dr. Adele Schilder - appt 5/11 - initial appointment   Have You Been Recently Discharged From Any Office Practice or Programs? No  Explanation of Discharge From Practice/Program: No data recorded    CCA Screening Triage Referral Assessment Type of Contact: Face-to-Face  Is this Initial or Reassessment? No data recorded Date Telepsych consult ordered in [facility]:  No data recorded Time Telepsych consult ordered in [facility]:  No data recorded  Patient Reported Information Reviewed? Yes  Patient Left Without Being Seen? No data recorded Reason for Not Completing Assessment: No data recorded  Collateral Involvement: Patient't daughter, Loma Sousa, provided collateral.   Does Patient Have a [place]? No data recorded Name and Contact  of Legal Guardian: No data recorded If Minor and Not Living with Parent(s), Who has Custody? No data recorded Is CPS involved or ever been involved? Never  Is APS involved or ever been involved? Never   Patient Determined To Be At Risk for Harm To Self or Others Based on Review of Patient Reported Information or Presenting Complaint? No  Method: No data recorded Availability of Means: No data recorded Intent: No data recorded Notification Required: No data recorded Additional Information for Danger to Others Potential: No data recorded Additional Comments for Danger to Others Potential: No data recorded Are There Guns or Other Weapons in Your Home? No data recorded Types of Guns/Weapons: No data recorded Are These Weapons Safely Secured?                            No data recorded Who Could Verify You Are Able To Have These Secured: No data recorded Do You Have any Outstanding Charges, Pending Court Dates, Parole/Probation? No data recorded Contacted To Inform of Risk of Harm To Self or Others: No data recorded  Location of Assessment: [facility]   Does Patient Present under Involuntary Commitment? No  IVC Papers Initial File Date: No data recorded  [place] of Residence:    Patient Currently Receiving the Following Services:  Medication Management   Determination of Need: Urgent (48 hours)   Options For Referral: Intensive Outpatient Therapy     CCA Biopsychosocial Intake/Chief Complaint:  Patient presents with daughter after being seen in ED last night at [facility].  Patient re started Abilify, however daughter concerned she may need a different medication.  Patient presents with pressured speech and tangential thought process during assessment.  Daughter is concerned she may be moving in to a manic episode, as she is seeing signs from past episodes.  Current Symptoms/Problems: See above   Patient Reported Schizophrenia/Schizoaffective Diagnosis in  Past: No   Strengths: Patient works full time, has family support  Preferences: No data recorded Abilities: No data recorded  Type of Services Patient Feels are Needed: Patient is resistant to treatment, as she prefers to manage symptoms without medications.   Initial Clinical Notes/Concerns: No data recorded  Mental Health Symptoms Depression:  None   Duration of Depressive symptoms: No data recorded  Mania:  Racing thoughts; Irritability; Increased Energy; Change in energy/activity   Anxiety:   Tension; Worrying   Psychosis:  Delusions   Duration of Psychotic symptoms: Less than six months   Trauma:  None   Obsessions:  None   Compulsions:  None   Inattention:  N/A   Hyperactivity/Impulsivity:  N/A   Oppositional/Defiant Behaviors:  N/A   Emotional Irregularity:  Mood lability   Other Mood/Personality Symptoms:  No data recorded   Mental Status Exam Appearance and self-care  Stature:  Average   Weight:  Average weight   Clothing:  Casual   Grooming:  Normal   Cosmetic use:  Age appropriate   Posture/gait:  Tense   Motor activity:  Restless   Sensorium  Attention:  Distractible   Concentration:  Scattered   Orientation:  Object; Person; Place; Time   Recall/memory:  Defective in Short-term   Affect and Mood  Affect:  Labile   Mood:  Irritable; Negative   Relating  Eye contact:  Normal   Facial expression:  Anxious; Angry   Attitude toward examiner:  Defensive; Irritable   Thought and Language  Speech flow: Flight of Ideas; Pressured   Thought content:  Delusions; Appropriate to Mood and Circumstances   Preoccupation:  None   Hallucinations:  None   Organization:  No data recorded  [employer] of Knowledge:  Average   Intelligence:  Average   Abstraction:  Normal   Judgement:  Fair   Reality Testing:  Adequate   Insight:  Fair   Decision Making:  Impulsive; Vacilates   Social Functioning  Social  Maturity:  Impulsive   Social Judgement:  Naive   Stress  Stressors:  Family conflict; Work   Coping Ability:  Overwhelmed; Exhausted   Skill Deficits:  Decision making; Self-control; Interpersonal   Supports:  Family; Friends/Service system     Religion: Religion/Spirituality Are You A Religious Person?: No  Leisure/Recreation: Leisure / Recreation Do You Have Hobbies?: No  Exercise/Diet: Exercise/Diet Do You Exercise?: No Have You Gained or Lost A Significant Amount of Weight in the Past Six Months?: No Do You Follow a Special Diet?: No Do You Have Any Trouble Sleeping?: Yes Explanation of Sleeping Difficulties: has taken mother's xanax the last two nights to help with sleep - has taken trazadone for sleep in the past   CCA Employment/Education Employment/Work Situation: Employment / Work Situation Employment situation: Employed 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 What is the longest time patient has a held a job?: 5 years  Where was the patient employed at that time?: [employer]  Has patient ever been in the [employer]?: No  Education: Education Is Patient Currently Attending School?: No Last Grade Completed:  (NA) Name of High School: NA Did Teacher, adult education From [employer]?: Yes Did You Attend College?: Yes What Type of College Degree Do you Have?: finance Did [place]?: No Did You Have An Individualized Education Program (IIEP): No Did You Have Any Difficulty At School?: No Patient's Education Has Been Impacted by Current Illness: No   CCA Family/Childhood History Family and Relationship History: Family history Marital status: Married What types of issues is patient dealing with in the relationship?: She believes she is a  burden to her family due to her hospitalizations Bipolar illness. Are you sexually active?:  (NA) What is your sexual orientation?: NA Has your sexual activity been affected by drugs, alcohol, medication, or emotional stress?: NA Does patient have children?: Yes How many children?: 2 How is patient's relationship with their children?: 2 daughters ages 26 and 65 - good relationships  Childhood History:  Childhood History By whom was/is the patient raised?: Both parents Additional childhood history information: Parents were divorced when she was 71 years old.  Description of patient's relationship with caregiver when they were a child: "great"  Patient's description of current relationship with people who raised him/her: NA How were you disciplined when you got in trouble as a child/adolescent?: NA Does patient have siblings?:  (NA) Did patient suffer any verbal/emotional/physical/sexual abuse as a child?: No Did patient suffer from severe childhood neglect?: No Has patient ever been sexually abused/assaulted/raped as an adolescent or adult?: No Was the patient ever a victim of a crime or a disaster?: No Witnessed domestic violence?: No Has patient been affected by domestic violence as an adult?: No  Child/Adolescent Assessment:     CCA Substance Use Alcohol/Drug Use: Alcohol / Drug Use Pain Medications:  (na) Prescriptions:  (overdosed on meds ) Over the Counter:  (na) History of alcohol / drug use?: No history of alcohol / drug abuse Longest period of sobriety (when/how long):  (na) Negative Consequences of Use:  (na) Withdrawal Symptoms:  (na)      ASAM's:  Six Dimensions of Multidimensional Assessment  Dimension 1:  Acute Intoxication and/or Withdrawal Potential:      Dimension 2:  Biomedical Conditions and Complications:      Dimension 3:  Emotional, Behavioral, or Cognitive Conditions and Complications:     Dimension 4:  Readiness to Change:     Dimension 5:   Relapse, Continued use, or Continued Problem Potential:     Dimension 6:  Recovery/Living Environment:     ASAM Severity Score:    ASAM Recommended Level of Treatment:     Substance use Disorder (SUD)    Recommendations for Services/Supports/Treatments:    DSM5 Diagnoses: Patient Active Problem List   Diagnosis Date Noted  . Vaginitis 08/14/2020  . Yeast vaginitis 08/14/2020  . Steatosis of liver 07/20/2020  . S/P laparoscopic appendectomy 07/07/2020  . Abdominal pain 07/04/2020  . Sepsis ([place])   . Pneumoperitoneum   . Acute appendicitis 07/02/2020  . Bipolar affective ([place])   . Encounter for medical examination to establish care 03/27/2020  . Obesity, unspecified 07/28/2017  .  Restless legs 12/17/2014    Patient Centered Plan: Patient is on the following Treatment Plan(s):  Bipolar   Referrals to Alternative Service(s): Referred to Alternative Service(s):   Place:   Date:   Time:    Referred to Alternative Service(s):   Place:   Date:   Time:    Referred to Alternative Service(s):   Place:   Date:   Time:    Referred to Alternative Service(s):   Place:   Date:   Time:     Fransico Meadow, [facility]

## 2020-10-02 NOTE — Progress Notes (Signed)
[name] received her AVS, questions answered and she was escorted to the lobby.

## 2020-10-02 NOTE — ED Provider Notes (Signed)
Behavioral Health Urgent Care Medical Screening Exam  Patient Name: [name] MRN: [id number] Date of Evaluation: 10/02/20 Chief Complaint: Chief Complaint/Presenting Problem: Patient presents with daughter after being seen in ED last night at [facility].  Patient re started Abilify, however daughter concerned she may need a different medication.  Patient presents with pressured speech and tangential thought process during assessment.  Daughter is concerned she may be moving in to a manic episode, as she is seeing signs from past episodes. Diagnosis:  Final diagnoses:  Bipolar affective disorder, current episode hypomanic ([place])    History of Present illness: [name] is a 50 y.o. female.  Patient presents voluntarily for walk-in assessment, accompanied by her daughter, [name].  [name] request that [name] remain present during assessment.  Patient alert and oriented, answers appropriately.  Patient participates actively in assessment.  She reports "I feel like I have been overthinking things, I just started Abilify again on last night."  She is insightful regarding diagnosis, states she has been "focused too much on work and stressing about job."  She endorses paranoia states "I am really thinking a lot."  With prompting from daughter she is able to describe recently feeling paranoid that people in grocery store were talking about her or that coworkers were speculating about her mental illness.  [name] has been diagnosed with bipolar affective disorder.  She has taken only trazodone for the past 2 years.  She reports two years ago she was stable on Abilify but stopped taking this medication as she felt like "I could do it on my own."  She reports feeling that daily medications are reminded that she suffers from mental illness, is interested in discussing long-acting injectable with outpatient psychiatry.  She is not currently followed by outpatient counseling but reports plan to see  outpatient counseling moving forward.  She denies suicidal and homicidal ideations.  She endorses 2 prior suicide attempts, last attempt 5 years ago.  She contracts verbally for safety with this Probation officer, she denies behaviors of self-harm.  She denies auditory visual hallucinations.  [name] is not able to contract verbally for safety with this Probation officer.    She resides in [place] with her husband.  She denies access to weapons.  She is employed as a Development worker, community.  She denies alcohol and substance use.  She reports she has taken alprazolam belonging to her mother 2 times this week as she did not have access to her trazodone while staying overnight at mother's home.  She endorses average appetite.  Patient reports last week she had difficulty sleeping, reports slept well last night.  Patient offered support and encouragement.  Patient's daughter [name] reports [name] has recently experienced paranoia believing that strangers in the grocery store or talking about her also felt that a song played on guitar by her father ([name]'s husband)  was sending a message.This prompted [name] visiting emergency department to restart Abilify last evening.  [name] ([name]'s daughter) denies safety concerns at this time, discussed follow-up including returning to emergency department for increased symptoms, [name] verbalized understanding.  Psychiatric Specialty Exam  Presentation  General Appearance:Appropriate for Environment; Casual  Eye Contact:Good  Speech:Clear and Coherent  Speech Volume:Increased  Handedness:Right   Mood and Affect  Mood:Anxious  Affect:Congruent   Thought Process  Thought Processes:Coherent; Goal Directed  Descriptions of Associations:Intact  Orientation:Full (Time, Place and Person)  Thought Content:Logical    Hallucinations:None  Ideas of Reference:Paranoia  Suicidal Thoughts:No  Homicidal Thoughts:No   Sensorium  Memory:Immediate Fair;  Recent  Fair; Remote Fair  Judgment:Fair  Insight:Fair   Executive Functions  Concentration:Good  Attention Span:Good  [place]  Language:Good   Psychomotor Activity  Psychomotor Activity:Normal   Assets  Assets:Communication Skills; Desire for Improvement; Financial Resources/Insurance; Housing; Intimacy; Leisure Time; Physical Health; Resilience; Social Support; Talents/Skills; Transportation   Sleep  Sleep:Good  Number of hours: No data recorded  No data recorded  Physical Exam: Physical Exam Vitals and nursing note reviewed.  Constitutional:      Appearance: She is well-developed.  HENT:     Head: Normocephalic.     Nose: Nose normal.  Cardiovascular:     Rate and Rhythm: Normal rate.  Pulmonary:     Effort: Pulmonary effort is normal.  Musculoskeletal:        General: Normal range of motion.  Neurological:     Mental Status: She is alert and oriented to person, place, and time.  Psychiatric:        Attention and Perception: Attention and perception normal.        Mood and Affect: Affect normal. Mood is anxious.        Speech: Speech normal.        Behavior: Behavior normal. Behavior is cooperative.        Thought Content: Thought content is paranoid.        Cognition and Memory: Cognition and memory normal.        Judgment: Judgment normal.    Review of Systems  Constitutional: Negative.   HENT: Negative.   Eyes: Negative.   Respiratory: Negative.   Cardiovascular: Negative.   Gastrointestinal: Negative.   Genitourinary: Negative.   Musculoskeletal: Negative.   Skin: Negative.   Neurological: Negative.   Endo/Heme/Allergies: Negative.   Psychiatric/Behavioral: The patient is nervous/anxious.    Blood pressure 131/77, pulse 84, temperature 97.9 F (36.6 C), temperature source Oral, resp. rate 18, height 5\' 2"  (1.575 m), weight 210 lb (95.3 kg), SpO2 98 %. Body mass index is 38.41 kg/m.  Musculoskeletal: Strength &  Muscle Tone: within normal limits Gait & Station: unsteady Patient leans: N/A   [place] MSE Discharge Disposition for Follow up and Recommendations: Based on my evaluation the patient does not appear to have an emergency medical condition and can be discharged with resources and follow up care in outpatient services for Medication Management, Partial Hospitalization Program and Individual Therapy  Patient reviewed with Dr. Serafina Mitchell. Follow-up with outpatient psychiatry, discussed upcoming outpatient appointment with Dr. Adele Schilder on 10/14/2020. Continue current medications: -Abilify 10 mg daily -Trazodone 100 mg nightly as needed/sleep Follow-up with partial hospitalization, resources provided.   Lucky Rathke, FNP 10/02/2020, 2:48 PM

## 2020-10-02 NOTE — ED Notes (Signed)
E signature pad not working. Pt educated on discharge instructions and verbalized understanding.  

## 2020-10-02 NOTE — Telephone Encounter (Signed)
Noted  

## 2020-10-02 NOTE — ED Notes (Signed)
See triage note. Pt states she has been off her abilify for about 2 years. She has been experiencing some confusion and is experiencing irritation with her family which is reportedly unusual. Pt denies SI/HI. She was referred to psychiatry by her primary care but her initial appointment is not until May 11th.

## 2020-10-02 NOTE — Telephone Encounter (Signed)
In regards to work, she was going to bring in STD or FMLA paperwork. If she needs work note for this week, certainly we can provide that she is unable to be at work however long term, she will need STD, FMLA  Looks like she in ED now.

## 2020-10-02 NOTE — ED Provider Notes (Signed)
[facility] Emergency Department Provider Note  ____________________________________________   Event Date/Time   First MD Initiated Contact with Patient 10/01/20 2328     (approximate)  I have reviewed the triage vital signs and the nursing notes.   HISTORY  Chief Complaint Medication Refill    HPI [name] is a 50 y.o. female with medical and psychiatric history as listed below who presents for medication refill.  She said that for couple weeks she has been gradually worsening in terms of her anxiety, difficulty sleeping, difficulty coping with things at work, etc.  She has been in frequent communication with her primary care doctor to be referred back to a psychiatrist that she saw previously in [place].  She found out today that she does have an appointment but it is not for another 2 weeks.  She is afraid that she will not be able to make it that long and she very much wants to return to work.  She tried going to [place] today but said there was some miscommunications about what time she should show up and that she was not able to get any help with her today.  Her family recommended that she come in tonight to see if she can get back on her Abilify which she took previously and which helped her manage her symptoms.  She adamantly denies suicidal ideation and homicidal ideation and she wants help.  She has not had any recent medical complaints or concerns including denying fever, sore throat, nausea, vomiting, and abdominal pain.         Past Medical History:  Diagnosis Date  . Akathisia 12/17/2014  . Bipolar affective ([place])   . Restless leg syndrome   . Schizoaffective disorder ([employer])   . Schizoaffective disorder [facility])     Patient Active Problem List   Diagnosis Date Noted  . Vaginitis 08/14/2020  . Yeast vaginitis 08/14/2020  . Steatosis of liver 07/20/2020  . S/P laparoscopic appendectomy 07/07/2020  . Abdominal pain 07/04/2020  . Sepsis  ([place])   . Pneumoperitoneum   . Acute appendicitis 07/02/2020  . Bipolar affective ([place])   . Encounter for medical examination to establish care 03/27/2020  . Obesity, unspecified 07/28/2017  . Restless legs 12/17/2014    Past Surgical History:  Procedure Laterality Date  . CESAREAN SECTION    . LAPAROSCOPIC APPENDECTOMY N/A 07/02/2020   Procedure: APPENDECTOMY LAPAROSCOPIC;  Surgeon: Fredirick Maudlin, MD;  Location: [facility] ORS;  Service: General;  Laterality: N/A;    Prior to Admission medications   Medication Sig Start Date End Date Taking? Authorizing Provider  ARIPiprazole (ABILIFY) 10 MG tablet Take 1 tablet (10 mg total) by mouth daily. 10/02/20 11/01/20 Yes Hinda Kehr, MD  COVID-19 mRNA Vac-TriS, Pfizer, SUSP injection USE AS DIRECTED Patient not taking: No sig reported 07/28/20 07/28/21  Carlyle Basques, MD  metroNIDAZOLE (FLAGYL) 500 MG tablet Take 1 tablet (500 mg total) by mouth 2 (two) times daily. With food Patient not taking: No sig reported 08/14/20   McLean-Scocuzza, Nino Glow, MD  ondansetron (ZOFRAN-ODT) 4 MG disintegrating tablet Take 1 tablet (4 mg total) by mouth every 6 (six) hours as needed for nausea. Patient not taking: No sig reported 07/02/20   Fredirick Maudlin, MD  polyethylene glycol (MIRALAX / GLYCOLAX) 17 g packet Take 17 g by mouth daily as needed for mild constipation. Patient not taking: No sig reported 07/06/20   Tylene Fantasia, PA-C  traZODone (DESYREL) 100 MG tablet Take 1 tablet (  100 mg total) by mouth at bedtime as needed. 08/13/20   McLean-Scocuzza, Nino Glow, MD    Allergies Patient has no known allergies.  Family History  Problem Relation Age of Onset  . Drug abuse Maternal Aunt     Social History Social History   Tobacco Use  . Smoking status: Current Every Day Smoker    Packs/day: 0.50    Years: 10.00    Pack years: 5.00    Types: Cigarettes  . Smokeless tobacco: Never Used  Vaping Use  . Vaping Use: Former  Substance Use Topics  .  Alcohol use: No  . Drug use: No    Review of Systems Constitutional: No fever/chills Eyes: No visual changes. ENT: No sore throat. Cardiovascular: Denies chest pain. Respiratory: Denies shortness of breath. Gastrointestinal: No abdominal pain.  No nausea, no vomiting.  No diarrhea.  No constipation. Genitourinary: Negative for dysuria. Musculoskeletal: Negative for neck pain.  Negative for back pain. Integumentary: Negative for rash. Neurological: Negative for headaches, focal weakness or numbness. Psychiatric:  Anxiety, some difficulty sleeping, but denies SI and HI.  ____________________________________________   PHYSICAL EXAM:  VITAL SIGNS: ED Triage Vitals  Enc Vitals Group     BP 10/01/20 2210 (!) 146/84     Pulse Rate 10/01/20 2210 95     Resp 10/01/20 2210 16     Temp --      Temp src --      SpO2 10/01/20 2210 98 %     Weight 10/01/20 2211 90.7 kg (200 lb)     Height 10/01/20 2211 1.575 m (5\' 2" )     Head Circumference --      Peak Flow --      Pain Score 10/01/20 2211 0     Pain Loc --      Pain Edu? --      Excl. in [place]? --     Constitutional: Alert and oriented.  Eyes: Conjunctivae are normal.  Head: Atraumatic. Nose: No congestion/rhinnorhea. Mouth/Throat: Patient is wearing a mask. Neck: No stridor.  No meningeal signs.   Cardiovascular: Normal rate, regular rhythm. Good peripheral circulation. Respiratory: Normal respiratory effort.  No retractions. Gastrointestinal: Soft and nontender. No distention.  Musculoskeletal: No lower extremity tenderness nor edema. No gross deformities of extremities. Neurologic:  Normal speech and language. No gross focal neurologic deficits are appreciated.  Skin:  Skin is warm, dry and intact. Psychiatric: Mood and affect are anxious but the patient is generally appropriate under the circumstances.  Her thinking and speech are not tangential and she has good insight and judgment into her circumstances.  She adamantly  denies suicidal ideation and homicidal ideation and wants help    ____________________________________________   INITIAL IMPRESSION / MDM / [place] / ED COURSE  As part of my medical decision making, I reviewed the following data within the [place] notes reviewed and incorporated, Old chart reviewed, A consult was requested and obtained from this/these consultant(s) Psychiatry and Notes from prior ED visits   Differential diagnosis includes, but is not limited to, bipolar disorder, anxiety, nonspecific adjustment disorder.  Patient is calm, cooperative, and reasonable.  She has been trying to get psychiatric help through her regular primary care provider and has an appointment scheduled in about 2 weeks.  I was able to verify that she was on Abilify in the past although I cannot verify the dose.  I talked to her about psychiatric consultation and she agreed that  she would like to speak with someone.  Kennyth Lose from psychiatry talk to her and she agreed that the patient does not need inpatient treatment and certainly does not meet criteria for IVC.  She is comfortable with the plan for me to prescribe Abilify for the patient to bridge her until she can see the psychiatrist.  I wrote for a low-dose of 10 mg/day and I gave her first dose tonight.  I gave strict return precautions and the patient understands and agrees with the plan.           ____________________________________________  FINAL CLINICAL IMPRESSION(S) / ED DIAGNOSES  Final diagnoses:  Anxiety  Bipolar affective disorder, remission status unspecified ([place])     MEDICATIONS GIVEN DURING THIS VISIT:  Medications  ARIPiprazole (ABILIFY) tablet 10 mg (10 mg Oral Given 10/02/20 0053)     ED Discharge Orders         Ordered    ARIPiprazole (ABILIFY) 10 MG tablet  Daily        10/02/20 0026          *Please note:  [name] was evaluated in Emergency Department on 10/02/2020  for the symptoms described in the history of present illness. She was evaluated in the context of the global COVID-19 pandemic, which necessitated consideration that the patient might be at risk for infection with the SARS-CoV-2 virus that causes COVID-19. Institutional protocols and algorithms that pertain to the evaluation of patients at risk for COVID-19 are in a state of rapid change based on information released by regulatory bodies including the CDC and federal and state organizations. These policies and algorithms were followed during the patient's care in the ED.  Some ED evaluations and interventions may be delayed as a result of limited staffing during and after the pandemic.*  Note:  This document was prepared using Dragon voice recognition software and may include unintentional dictation errors.   Hinda Kehr, MD 10/02/20 [phone]

## 2020-10-03 ENCOUNTER — Inpatient Hospital Stay
Admission: RE | Admit: 2020-10-03 | Discharge: 2020-10-07 | DRG: 885 | Disposition: A | Discharge status: Home / Self Care | Payer: 59 | Source: Intra-hospital | Attending: Psychiatry | Admitting: Psychiatry

## 2020-10-03 ENCOUNTER — Emergency Department
Admission: EM | Admit: 2020-10-03 | Discharge: 2020-10-03 | Disposition: A | Discharge status: Other Acute Inpatient Hospital | Payer: 59 | Attending: Emergency Medicine | Admitting: Emergency Medicine

## 2020-10-03 ENCOUNTER — Encounter: Payer: Self-pay | Admitting: Psychiatry

## 2020-10-03 ENCOUNTER — Other Ambulatory Visit: Payer: Self-pay

## 2020-10-03 DIAGNOSIS — R45851 Suicidal ideations: Secondary | ICD-10-CM | POA: Diagnosis not present

## 2020-10-03 DIAGNOSIS — F17210 Nicotine dependence, cigarettes, uncomplicated: Secondary | ICD-10-CM | POA: Insufficient documentation

## 2020-10-03 DIAGNOSIS — F319 Bipolar disorder, unspecified: Secondary | ICD-10-CM | POA: Diagnosis present

## 2020-10-03 DIAGNOSIS — G4700 Insomnia, unspecified: Secondary | ICD-10-CM | POA: Diagnosis present

## 2020-10-03 DIAGNOSIS — Z9119 Patient's noncompliance with other medical treatment and regimen: Secondary | ICD-10-CM | POA: Diagnosis not present

## 2020-10-03 DIAGNOSIS — Z79899 Other long term (current) drug therapy: Secondary | ICD-10-CM | POA: Diagnosis not present

## 2020-10-03 DIAGNOSIS — F3110 Bipolar disorder, current episode manic without psychotic features, unspecified: Secondary | ICD-10-CM | POA: Diagnosis present

## 2020-10-03 DIAGNOSIS — F309 Manic episode, unspecified: Secondary | ICD-10-CM | POA: Diagnosis not present

## 2020-10-03 DIAGNOSIS — R000 Tachycardia, unspecified: Secondary | ICD-10-CM | POA: Insufficient documentation

## 2020-10-03 DIAGNOSIS — F3112 Bipolar disorder, current episode manic without psychotic features, moderate: Secondary | ICD-10-CM | POA: Diagnosis not present

## 2020-10-03 LAB — COMPREHENSIVE METABOLIC PANEL
ALT: 22 U/L (ref 0–44)
AST: 22 U/L (ref 15–41)
Albumin: 4.4 g/dL (ref 3.5–5.0)
Alkaline Phosphatase: 88 U/L (ref 38–126)
Anion gap: 10 (ref 5–15)
BUN: 14 mg/dL (ref 6–20)
CO2: 19 mmol/L — ABNORMAL LOW (ref 22–32)
Calcium: 8.9 mg/dL (ref 8.9–10.3)
Chloride: 111 mmol/L (ref 98–111)
Creatinine, Ser: 0.7 mg/dL (ref 0.44–1.00)
GFR, Estimated: >60 mL/min (ref >60)
Glucose, Bld: 118 mg/dL — ABNORMAL HIGH (ref 70–99)
Potassium: 4 mmol/L (ref 3.5–5.1)
Sodium: 140 mmol/L (ref 135–145)
Total Bilirubin: 0.5 mg/dL (ref 0.3–1.2)
Total Protein: 8 g/dL (ref 6.5–8.1)

## 2020-10-03 LAB — URINE DRUG SCREEN, QUALITATIVE (ARMC ONLY)
Amphetamines, Ur Screen: NOT DETECTED
Barbiturates, Ur Screen: NOT DETECTED
Benzodiazepine, Ur Scrn: NOT DETECTED
Cannabinoid 50 Ng, Ur ~~LOC~~: NOT DETECTED
Cocaine Metabolite,Ur ~~LOC~~: NOT DETECTED
MDMA (Ecstasy)Ur Screen: NOT DETECTED
Methadone Scn, Ur: NOT DETECTED
Opiate, Ur Screen: NOT DETECTED
Phencyclidine (PCP) Ur S: NOT DETECTED
Tricyclic, Ur Screen: NOT DETECTED

## 2020-10-03 LAB — CBC
HCT: 41.1 % (ref 36.0–46.0)
Hemoglobin: 13.6 g/dL (ref 12.0–15.0)
MCH: 29.2 pg (ref 26.0–34.0)
MCHC: 33.1 g/dL (ref 30.0–36.0)
MCV: 88.4 fL (ref 80.0–100.0)
Platelets: 349 10*3/uL (ref 150–400)
RBC: 4.65 MIL/uL (ref 3.87–5.11)
RDW: 14.5 % (ref 11.5–15.5)
WBC: 11.7 10*3/uL — ABNORMAL HIGH (ref 4.0–10.5)
nRBC: 0 % (ref 0.0–0.2)

## 2020-10-03 LAB — ACETAMINOPHEN LEVEL: Acetaminophen (Tylenol), Serum: <10 ug/mL — ABNORMAL LOW (ref 10–30)

## 2020-10-03 LAB — SALICYLATE LEVEL: Salicylate Lvl: <7 mg/dL — ABNORMAL LOW (ref 7.0–30.0)

## 2020-10-03 LAB — ETHANOL: Alcohol, Ethyl (B): <10 mg/dL (ref <10)

## 2020-10-03 MED ORDER — LORAZEPAM 1 MG PO TABS: 1.0000 mg | ORAL_TABLET | Freq: Once | ORAL | Status: DC

## 2020-10-03 MED ORDER — TRAZODONE HCL 50 MG PO TABS
50.0000 mg | ORAL_TABLET | Freq: Every evening | ORAL | Status: DC | PRN
  Administered 2020-10-04 – 2020-10-05 (×2): 50 mg via ORAL
  Filled 2020-10-03 (×2): qty 1

## 2020-10-03 MED ORDER — LORAZEPAM 2 MG PO TABS
2.0000 mg | ORAL_TABLET | Freq: Three times a day (TID) | ORAL | Status: DC | PRN
  Administered 2020-10-03: 2 mg via ORAL
  Filled 2020-10-03 (×2): qty 1

## 2020-10-03 MED ORDER — HYDROXYZINE HCL 25 MG PO TABS
25.0000 mg | ORAL_TABLET | Freq: Three times a day (TID) | ORAL | Status: DC | PRN
  Administered 2020-10-05: 25 mg via ORAL
  Filled 2020-10-03: qty 1

## 2020-10-03 MED ORDER — ACETAMINOPHEN 325 MG PO TABS: 650.0000 mg | ORAL_TABLET | Freq: Four times a day (QID) | ORAL | Status: DC | PRN

## 2020-10-03 MED ORDER — MAGNESIUM HYDROXIDE 400 MG/5ML PO SUSP: 30.0000 mL | Freq: Every day | ORAL | Status: DC | PRN

## 2020-10-03 MED ORDER — ZIPRASIDONE MESYLATE 20 MG IM SOLR: 20.0000 mg | INTRAMUSCULAR | Status: DC | PRN

## 2020-10-03 MED ORDER — OLANZAPINE 5 MG PO TBDP: 5.0000 mg | ORAL_TABLET | Freq: Three times a day (TID) | ORAL | Status: DC | PRN

## 2020-10-03 MED ORDER — LORAZEPAM 1 MG PO TABS
1.0000 mg | ORAL_TABLET | Freq: Once | ORAL | Status: AC
  Administered 2020-10-03: 1 mg via ORAL
  Filled 2020-10-03: qty 1

## 2020-10-03 MED ORDER — ALUM & MAG HYDROXIDE-SIMETH 200-200-20 MG/5ML PO SUSP: 30.0000 mL | ORAL | Status: DC | PRN

## 2020-10-03 MED ORDER — ARIPIPRAZOLE 5 MG PO TABS
5.0000 mg | ORAL_TABLET | Freq: Every day | ORAL | Status: DC
  Administered 2020-10-04: 5 mg via ORAL
  Filled 2020-10-03: qty 1

## 2020-10-03 MED ORDER — LORAZEPAM 1 MG PO TABS
1.0000 mg | ORAL_TABLET | ORAL | Status: AC | PRN
  Administered 2020-10-06: 1 mg via ORAL
  Filled 2020-10-03: qty 1

## 2020-10-03 NOTE — ED Notes (Signed)
Hourly rounding reveals patient in room. No complaints, stable, in no acute distress. Q15 minute rounds and monitoring via [place] and Officer to continue.   

## 2020-10-03 NOTE — ED Notes (Signed)
Pt refusing to take PO Ativan. Repeatedly asks why she is beng held here and is unhappy she cannot speak to the [facility] doctor.   Pt is asking to speak with the EDP again.

## 2020-10-03 NOTE — ED Notes (Signed)
Patient is stable in NAD. She is transferred to [facility] via NT and officer. Patient belongings transferred to BMU. Report given to [facility] RN BMU. No issues.

## 2020-10-03 NOTE — ED Notes (Signed)
RN and NT explained to patient she cannot leave until she speaks with psychiatry.  Pt accepting and agreeable to taking PO Ativan.

## 2020-10-03 NOTE — ED Notes (Signed)
PLEASE CALL SPOUSE JEFF Townley AT NUMBER LISTED IN CHART BEFORE D/C PATIENT. SPOUSE REQUEST PSYCHIATRY CALL

## 2020-10-03 NOTE — ED Notes (Signed)
Pt becoming more agitated and asking for her clothes because she is ready to leave. EDP made aware.

## 2020-10-03 NOTE — ED Notes (Signed)
Pt dressed out with this RN and Faith NT. Belongings include: Yellow ring.  Purse Sandals Pink pants Pink shirt Purple bra

## 2020-10-03 NOTE — Plan of Care (Signed)
  Problem: Education: Goal: Knowledge of [facility] Education information/materials will improve Outcome: Progressing Goal: Emotional status will improve Outcome: Progressing Goal: Mental status will improve Outcome: Progressing Goal: Verbalization of understanding the information provided will improve Outcome: Progressing   Problem: Activity: Goal: Interest or engagement in activities will improve Outcome: Progressing Goal: Sleeping patterns will improve Outcome: Progressing   Problem: Coping: Goal: Ability to verbalize frustrations and anger appropriately will improve Outcome: Progressing Goal: Ability to demonstrate self-control will improve Outcome: Progressing   Problem: Health Behavior/Discharge Planning: Goal: Identification of resources available to assist in meeting health care needs will improve Outcome: Progressing Goal: Compliance with treatment plan for underlying cause of condition will improve Outcome: Progressing   Problem: Physical Regulation: Goal: Ability to maintain clinical measurements within normal limits will improve Outcome: Progressing   Problem: Safety: Goal: Periods of time without injury will increase Outcome: Progressing   Problem: Education: Goal: Utilization of techniques to improve thought processes will improve Outcome: Progressing Goal: Knowledge of the prescribed therapeutic regimen will improve Outcome: Progressing   Problem: Activity: Goal: Interest or engagement in leisure activities will improve Outcome: Progressing Goal: Imbalance in normal sleep/wake cycle will improve Outcome: Progressing   Problem: Coping: Goal: Coping ability will improve Outcome: Progressing Goal: Will verbalize feelings Outcome: Progressing   Problem: Health Behavior/Discharge Planning: Goal: Ability to make decisions will improve Outcome: Progressing Goal: Compliance with therapeutic regimen will improve Outcome: Progressing    Problem: Role Relationship: Goal: Will demonstrate positive changes in social behaviors and relationships Outcome: Progressing   Problem: Safety: Goal: Ability to disclose and discuss suicidal ideas will improve Outcome: Progressing Goal: Ability to identify and utilize support systems that promote safety will improve Outcome: Progressing   Problem: Self-Concept: Goal: Will verbalize positive feelings about self Outcome: Progressing Goal: Level of anxiety will decrease Outcome: Progressing

## 2020-10-03 NOTE — ED Notes (Addendum)
Pt given dinner tray.

## 2020-10-03 NOTE — ED Notes (Signed)
Pt yelling at [place].  Pt remains agitated that she is under IVC and states she has no idea why.  Pt requested Ativan to help her stay calm.  Pt told this writer she will need Ativan around the clock. PO PRN Ativan is ordered at this time.

## 2020-10-03 NOTE — ED Triage Notes (Signed)
Pt to ED with husband who expressed pt has been threatening suicide this morning and fighting with husband. Pt has not seen psychiatrist in over  Year has not taken meds, hx bipolar and schizophrenia.  Pt not answering all questions appropriately, seen talking in room to self.  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   Dr Tamala Julian to triage to evaluate pt d/t pt stating she is going to leave  When asked if pt is having pain, pt states "no im ready to go to work, give me some damn scrubs"

## 2020-10-03 NOTE — ED Notes (Signed)
Pt agitated after hearing she will be staying in the hospital for inpatient treatment.  Per security, pt is cursing and talking about wanting to leave. EDP made aware.

## 2020-10-03 NOTE — Tx Team (Signed)
Initial Treatment Plan 10/03/2020 10:26 PM [name] [id number]    PATIENT STRESSORS: Marital or family conflict   PATIENT STRENGTHS: Ability for insight Active sense of humor Average or above average intelligence Capable of independent living Occupational psychologist fund of knowledge Motivation for treatment/growth Physical Health Religious Affiliation Special hobby/interest Supportive family/friends   PATIENT IDENTIFIED PROBLEMS:       depression               DISCHARGE CRITERIA:  Ability to meet basic life and health needs Adequate post-discharge living arrangements Improved stabilization in mood, thinking, and/or behavior Medical problems require only outpatient monitoring Motivation to continue treatment in a less acute level of care Need for constant or close observation no longer present Reduction of life-threatening or endangering symptoms to within safe limits Safe-care adequate arrangements made Verbal commitment to aftercare and medication compliance  PRELIMINARY DISCHARGE PLAN: Outpatient therapy Return to previous living arrangement  PATIENT/FAMILY INVOLVEMENT: This treatment plan has been presented to and reviewed with the patient, [name], and/or family member.  The patient and family have been given the opportunity to ask questions and make suggestions.  Libby Maw, RN 10/03/2020, 10:26 PM

## 2020-10-03 NOTE — BH Assessment (Signed)
Comprehensive Clinical Assessment (CCA) Note  10/03/2020 [name] [id number]  [name] is a 50 year old female who presents to the ER via her husband due concerns in the recent changes in her mood and the state of her mental health. Per the report of the husband Brittany Proud H.) his wife hasn't had her medications for her bipolar in approximately a year. She has history of doing well and stop taking them because of the belief she doesn't need them. The same thing took place approximately two years ago and it led to her having to be hospitalized. She having some the of the same symptoms and he doesn't want it to worsen to the point it takes more time to get her stable. He mentioned, once in the past she was "so manic and out of control, it took weeks for her to get back right. And that's what I don't want to happened again." Current symptoms he states his wife is having, are rapid changes in her mood. She could be loving and affection towards him and when goes into another room and returns in matter of minutes, she curses at him and tell him to leave her alone. When he asked what's wrong, she states, "you know what the hell you done." This morning (10/03/2020), her parents came to their home and she told them "to get the fuck out." Husband further explained, the patient doesn't curse and these are the behaviors she has when she isn't doing well. He shared, she isn't sleeping, he believes she's hearing voices and when she's talking to herself, he thinks she responding to the voices. She has expressed ideas of starting different business and get upset when he tells her no. He reports, she has accused him of talking to her family members in [place] and other relatives they haven't spoken with in years because the relationships became strained.  Per the patient, she had an argument with her husband because he doesn't want to start a business with her. 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 She also reports, of having concerns that they are going to break up, because he's been in communication with her cousin in [place], whose wife left him. She also reports of seeing an email he sent the relative stating he was going to an adult club when he travels to [place]. He denied it when she confronted him about it.  During the interview, the patient was fidgety and restless. When answering questions, it was difficult to finish her thoughts before talking about something else, even though it was somewhat related to what was being discussed. Patient also reports of having trouble sleeping and have a lot of ideas on what she and her family can do. However, they are resistance to change and it upsets her. Throughout the interview she denied SI/HI and AV/H.   Chief Complaint:  Chief Complaint  Patient presents with  . Psychiatric Evaluation   Visit Diagnosis: Bipolar    CCA Screening, Triage and Referral (STR)  Patient Reported Information How did you hear about [place]? Family/Friend  Referral name: Ayjah Show  Referral phone number: [phone]   Whom do you see for routine medical problems? No data recorded Practice/Facility Name: No data recorded Practice/Facility Phone Number: No data recorded Name of Contact: No data recorded Contact Number: No data recorded Contact Fax Number: No data recorded Prescriber Name: No data recorded Prescriber Address (if known): No data recorded  What Is the Reason for Your Visit/Call  Today? Per family, patient is currently manic.  How Long Has This Been Causing You Problems? 1 wk - 1 month  What Do You Feel Would Help You the Most Today? Treatment for Depression or other mood problem   Have You Recently Been in Any Inpatient Treatment (Hospital/Detox/Crisis Center/28-Day Program)? No  Name/Location of Program/Hospital:No data recorded How Long Were You There? No data recorded When Were You Discharged? No data  recorded  Have You Ever Received Services From [facility] Before? Yes  Who Do You See at [facility]? Mental Health and Medical Treatment   Have You Recently Had Any Thoughts About Hurting Yourself? No  Are You Planning to Commit Suicide/Harm Yourself At This time? No   Have you Recently Had Thoughts About [place]? No  Explanation: No data recorded  Have You Used Any Alcohol or Drugs in the Past 24 Hours? No  How Long Ago Did You Use Drugs or Alcohol? No data recorded What Did You Use and How Much? took mother's xanax "last two nights to help with sleep."   Do You Currently Have a Therapist/Psychiatrist? No  Name of Therapist/Psychiatrist: None   Have You Been Recently Discharged From Any Office Practice or Programs? No  Explanation of Discharge From Practice/Program: No data recorded    CCA Screening Triage Referral Assessment Type of Contact: Face-to-Face  Is this Initial or Reassessment? No data recorded Date Telepsych consult ordered in [facility]:  No data recorded Time Telepsych consult ordered in [facility]:  No data recorded  Patient Reported Information Reviewed? Yes  Patient Left Without Being Seen? No data recorded Reason for Not Completing Assessment: No data recorded  Collateral Involvement: Spoke with the husband   Does Patient Have a [place]? No data recorded Name and Contact of Legal Guardian: No data recorded If Minor and Not Living with Parent(s), Who has Custody? n/a  Is CPS involved or ever been involved? Never  Is APS involved or ever been involved? Never   Patient Determined To Be At Risk for Harm To Self or Others Based on Review of Patient Reported Information or Presenting Complaint? No  Method: No data recorded Availability of Means: No data recorded Intent: No data recorded Notification Required: No data recorded Additional Information for Danger to Others Potential: No data recorded Additional Comments  for Danger to Others Potential: No data recorded Are There Guns or Other Weapons in Your Home? No data recorded Types of Guns/Weapons: No data recorded Are These Weapons Safely Secured?                            No data recorded Who Could Verify You Are Able To Have These Secured: No data recorded Do You Have any Outstanding Charges, Pending Court Dates, Parole/Probation? No data recorded Contacted To Inform of Risk of Harm To Self or Others: No data recorded  Location of Assessment: [facility] ED   Does Patient Present under Involuntary Commitment? Yes  IVC Papers Initial File Date: 10/03/2020   [place] of Residence: [place]   Patient Currently Receiving the Following Services: Not Receiving Services   Determination of Need: Urgent (48 hours)   Options For Referral: Other: Comment (Pending Psych Consult)   CCA Biopsychosocial Intake/Chief Complaint:  Per the family, patient is manic.  Current Symptoms/Problems: Unable to sleep, rapid changes in mood, having AV/H.   Patient Reported Schizophrenia/Schizoaffective Diagnosis in Past: No   Strengths: When stable, she function  without any problems.  Preferences: She states she want to return home  Abilities: Able to care for her basic needs   Type of Services Patient Feels are Needed: She believes she don't need services   Initial Clinical Notes/Concerns: Patient manic   Mental Health Symptoms Depression:  Change in energy/activity; Difficulty Concentrating; Irritability; Sleep (too much or little); Tearfulness   Duration of Depressive symptoms: Less than two weeks   Mania:  Change in energy/activity; Increased Energy; Irritability; Overconfidence; Racing thoughts; Recklessness   Anxiety:   Difficulty concentrating; Restlessness; Sleep   Psychosis:  Delusions   Duration of Psychotic symptoms: Less than six months   Trauma:  None   Obsessions:  None   Compulsions:  Disrupts with routine/functioning; "Driven" to  perform behaviors/acts   Inattention:  Disorganized   Hyperactivity/Impulsivity:  Blurts out answers; Difficulty waiting turn; Fidgets with hands/feet   Oppositional/Defiant Behaviors:  None   Emotional Irregularity:  None   Other Mood/Personality Symptoms:  No data recorded   Mental Status Exam Appearance and self-care  Stature:  Average   Weight:  Overweight   Clothing:  Neat/clean; Age-appropriate   Grooming:  Normal   Cosmetic use:  None   Posture/gait:  Normal   Motor activity:  -- (Within normal range)   Sensorium  Attention:  Normal   Concentration:  Normal   Orientation:  X5   Recall/memory:  Defective in Recent   Affect and Mood  Affect:  Anxious; Constricted   Mood:  Anxious   Relating  Eye contact:  Normal   Facial expression:  Responsive   Attitude toward examiner:  Cooperative   Thought and Language  Speech flow: Flight of Ideas   Thought content:  Ideas of Reference   Preoccupation:  None   Hallucinations:  Auditory (Per husband)   Organization:  No data recorded  [employer] of Knowledge:  Average   Intelligence:  Average   Abstraction:  Functional   Judgement:  Fair   Reality Testing:  Distorted   Insight:  Fair   Decision Making:  Impulsive (Per husband)   Social Functioning  Social Maturity:  Irresponsible   Social Judgement:  Normal   Stress  Stressors:  Family conflict; Other (Comment) (Stop taking medications)   Coping Ability:  Normal   Skill Deficits:  Decision making (Due to mental illness)   Supports:  Friends/Service system; Family     Religion:    Leisure/Recreation: Leisure / Recreation Do You Have Hobbies?: No  Exercise/Diet: Exercise/Diet Do You Exercise?: No Have You Gained or Lost A Significant Amount of Weight in the Past Six Months?: No Do You Follow a Special Diet?: No Do You Have Any Trouble Sleeping?: Yes Explanation of Sleeping Difficulties: Trouble falling and  staying asleep   CCA Employment/Education Employment/Work Situation: Employment / Work Situation Employment situation: Employed Where is patient currently employed?: [employer] How long has patient been employed?: 2 years Patient's job has been impacted by current illness: Yes Describe how patient's job has been impacted: She had to take FMLA What is the longest time patient has a held a job?: Unable to quantify Where was the patient employed at that time?: n/a Has patient ever been in the [employer]?: No  Education: Education Is Patient Currently Attending School?: No   CCA Family/Childhood History Family and Relationship History: Family history Marital status: Married Number of Years Married: 89 What types of issues is patient dealing with in the relationship?: Recent strained due to symptoms  of bipolar Additional relationship information: None reported Are you sexually active?: Yes What is your sexual orientation?: Heterosexual Has your sexual activity been affected by drugs, alcohol, medication, or emotional stress?: None reported Does patient have children?: Yes How many children?: 2 How is patient's relationship with their children?: States it's good  Childhood History:  Childhood History By whom was/is the patient raised?: Both parents Additional childhood history information: None reported Description of patient's relationship with caregiver when they were a child: States it's good Patient's description of current relationship with people who raised him/her: States it's good How were you disciplined when you got in trouble as a child/adolescent?: Reports of no problems Does patient have siblings?: No Did patient suffer any verbal/emotional/physical/sexual abuse as a child?: No Did patient suffer from severe childhood neglect?: No Has patient ever been sexually abused/assaulted/raped as an adolescent or adult?: No Was the patient ever a victim of a crime or a  disaster?: No Witnessed domestic violence?: No Has patient been affected by domestic violence as an adult?: No  Child/Adolescent Assessment:     CCA Substance Use Alcohol/Drug Use: Alcohol / Drug Use Pain Medications: See PTA Prescriptions: See PTA Over the Counter: See PTA History of alcohol / drug use?: No history of alcohol / drug abuse Longest period of sobriety (when/how long): n/a                         ASAM's:  Six Dimensions of Multidimensional Assessment  Dimension 1:  Acute Intoxication and/or Withdrawal Potential:      Dimension 2:  Biomedical Conditions and Complications:      Dimension 3:  Emotional, Behavioral, or Cognitive Conditions and Complications:     Dimension 4:  Readiness to Change:     Dimension 5:  Relapse, Continued use, or Continued Problem Potential:     Dimension 6:  Recovery/Living Environment:     ASAM Severity Score:    ASAM Recommended Level of Treatment:     Substance use Disorder (SUD)    Recommendations for Services/Supports/Treatments:    DSM5 Diagnoses: Patient Active Problem List   Diagnosis Date Noted  . Vaginitis 08/14/2020  . Yeast vaginitis 08/14/2020  . Steatosis of liver 07/20/2020  . S/P laparoscopic appendectomy 07/07/2020  . Abdominal pain 07/04/2020  . Sepsis ([place])   . Pneumoperitoneum   . Acute appendicitis 07/02/2020  . Bipolar affective ([place])   . Encounter for medical examination to establish care 03/27/2020  . Obesity, unspecified 07/28/2017  . Restless legs 12/17/2014    Patient Centered Plan: Patient is on the following Treatment Plan(s):  Bipolar   Referrals to Alternative Service(s): Referred to Alternative Service(s):   Place:   Date:   Time:    Referred to Alternative Service(s):   Place:   Date:   Time:    Referred to Alternative Service(s):   Place:   Date:   Time:    Referred to Alternative Service(s):   Place:   Date:   Time:     Gunnar Fusi MS, LCAS, [facility],  [facility] Therapeutic Triage Specialist 10/03/2020 1:43 PM

## 2020-10-03 NOTE — BH Assessment (Signed)
Patient is to be admitted to [facility] by Dr. Tresea Mall.  Attending Physician will be Dr. Domingo Cocking.   Patient has been assigned to room 323, by [facility] Charge Nurse Jeanine Luz..   ER staff is aware of the admission:  Olivia Mackie, ER Secretary    Dr. Kerman Passey, ER MD   Amy B., Patient's Nurse   Loma Sousa, Patient Access  Call unit after 8pm for arrival time.

## 2020-10-03 NOTE — ED Provider Notes (Signed)
[facility] Emergency Department Provider Note  ____________________________________________   Event Date/Time   First MD Initiated Contact with Patient 10/03/20 1048     (approximate)  I have reviewed the triage vital signs and the nursing notes.   HISTORY  Chief Complaint Psychiatric Evaluation   HPI [name] is a 50 y.o. female with past medical history of RLS, bipolar affective disorder and schizoaffective disorder who presents to being brought to the ED by family she reportedly made statements To kill herself.  Patient was seen yesterday by her family medicine physician and restarted on Abilify with concern she has some decompensated mania.  At that time she was noted to have some pressured speech.  On arrival to emergency room patient is calm but somewhat tangential that she does not remember making suicidal statements to family.  She denies taking any illicit drugs.  Denies any hallucinations.  Denies any SI or HI to this examiner.  States she feels like her physical health is fine and does not have any recent fevers, chills, cough, nausea, vomiting, diarrhea, dysuria, rash or any other acute sick symptoms.         Past Medical History:  Diagnosis Date  . Akathisia 12/17/2014  . Bipolar affective ([place])   . Restless leg syndrome   . Schizoaffective disorder ([place])   . Schizoaffective disorder [facility])     Patient Active Problem List   Diagnosis Date Noted  . Vaginitis 08/14/2020  . Yeast vaginitis 08/14/2020  . Steatosis of liver 07/20/2020  . S/P laparoscopic appendectomy 07/07/2020  . Abdominal pain 07/04/2020  . Sepsis ([place])   . Pneumoperitoneum   . Acute appendicitis 07/02/2020  . Bipolar affective ([place])   . Encounter for medical examination to establish care 03/27/2020  . Obesity, unspecified 07/28/2017  . Restless legs 12/17/2014    Past Surgical History:  Procedure Laterality Date  . CESAREAN SECTION    . LAPAROSCOPIC  APPENDECTOMY N/A 07/02/2020   Procedure: APPENDECTOMY LAPAROSCOPIC;  Surgeon: Fredirick Maudlin, MD;  Location: [facility] ORS;  Service: General;  Laterality: N/A;    Prior to Admission medications   Medication Sig Start Date End Date Taking? Authorizing Provider  ARIPiprazole (ABILIFY) 10 MG tablet Take 1 tablet (10 mg total) by mouth daily. 10/02/20 11/01/20  Hinda Kehr, MD  traZODone (DESYREL) 100 MG tablet Take 1 tablet (100 mg total) by mouth at bedtime as needed. 08/13/20   McLean-Scocuzza, Nino Glow, MD    Allergies Patient has no known allergies.  Family History  Problem Relation Age of Onset  . Drug abuse Maternal Aunt     Social History Social History   Tobacco Use  . Smoking status: Current Every Day Smoker    Packs/day: 0.50    Years: 10.00    Pack years: 5.00    Types: Cigarettes  . Smokeless tobacco: Never Used  Vaping Use  . Vaping Use: Former  Substance Use Topics  . Alcohol use: No  . Drug use: No    Review of Systems  Review of Systems  Constitutional: Negative for chills and fever.  HENT: Negative for sore throat.   Eyes: Negative for pain.  Respiratory: Negative for cough and stridor.   Cardiovascular: Negative for chest pain.  Gastrointestinal: Negative for vomiting.  Genitourinary: Negative for dysuria.  Musculoskeletal: Negative for myalgias.  Skin: Negative for rash.  Neurological: Negative for seizures, loss of consciousness and headaches.  Psychiatric/Behavioral: The patient has insomnia.   All other systems reviewed  and are negative.     ____________________________________________   PHYSICAL EXAM:  VITAL SIGNS: ED Triage Vitals  Enc Vitals Group     BP 10/03/20 1047 140/83     Pulse Rate 10/03/20 1047 (!) 114     Resp 10/03/20 1047 20     Temp 10/03/20 1047 98.4 F (36.9 C)     Temp Source 10/03/20 1047 Oral     SpO2 10/03/20 1047 97 %     Weight 10/03/20 1048 206 lb (93.4 kg)     Height 10/03/20 1048 5\' 2"  (1.575 m)     Head  Circumference --      Peak Flow --      Pain Score 10/03/20 1047 0     Pain Loc --      Pain Edu? --      Excl. in [place]? --    Vitals:   10/03/20 1047  BP: 140/83  Pulse: (!) 114  Resp: 20  Temp: 98.4 F (36.9 C)  SpO2: 97%   Physical Exam Vitals and nursing note reviewed.  Constitutional:      General: She is not in acute distress.    Appearance: She is well-developed.  HENT:     Head: Normocephalic and atraumatic.     Right Ear: External ear normal.     Left Ear: External ear normal.     Nose: Nose normal.  Eyes:     Conjunctiva/sclera: Conjunctivae normal.  Cardiovascular:     Rate and Rhythm: Regular rhythm. Tachycardia present.     Heart sounds: No murmur heard.   Pulmonary:     Effort: Pulmonary effort is normal. No respiratory distress.     Breath sounds: Normal breath sounds.  Abdominal:     Palpations: Abdomen is soft.     Tenderness: There is no abdominal tenderness.  Musculoskeletal:     Cervical back: Neck supple.  Skin:    General: Skin is warm and dry.  Neurological:     Mental Status: She is alert.  Psychiatric:        Speech: Speech is rapid and pressured.        Thought Content: Thought content includes suicidal ( per report from family to RN) ideation.        Cognition and Memory: Cognition is impaired.     Patient is somewhat nonsensical in his review of talking about "making a business for preserving breath or difficulties and and not being able to go to hotel." ____________________________________________   LABS (all labs ordered are listed, but only abnormal results are displayed)  Labs Reviewed  CBC - Abnormal; Notable for the following components:      Result Value   WBC 11.7 (*)    All other components within normal limits  RESP PANEL BY RT-PCR (FLU A&B, COVID) ARPGX2  COMPREHENSIVE METABOLIC PANEL  ETHANOL  SALICYLATE LEVEL  ACETAMINOPHEN LEVEL  URINE DRUG SCREEN, QUALITATIVE ([place])  POC URINE PREG, ED    ____________________________________________  EKG  ____________________________________________  RADIOLOGY  ED MD interpretation:    Official radiology report(s): No results found.  ____________________________________________   PROCEDURES  Procedure(s) performed (including Critical Care):  Procedures   ____________________________________________   INITIAL IMPRESSION / ASSESSMENT AND PLAN / ED COURSE      Patient presents with above-stated history and exam after initially being seen by PCP yesterday and apparently this ED by Dr. Karma Greaser last night for reportedly making some suicidal statements to family.  On arrival she is  little bit disorganized making some nonsensical statements but denies any SI HI or acute physical complaints.  Suspect likely decompensated bipolar disorder lower suspicion for acute ingestion or significant contributing organic pathology although we will send routine psych screening labs.  TTS and psych consulted.  Patient IVC did due to concerns for her safety with significant disorganization present on arrival.   The patient has been placed in psychiatric observation due to the need to provide a safe environment for the patient while obtaining psychiatric consultation and evaluation, as well as ongoing medical and medication management to treat the patient's condition.  The patient has been placed under full IVC at this time.       ____________________________________________   FINAL CLINICAL IMPRESSION(S) / ED DIAGNOSES  Final diagnoses:  Mania ([place])    Medications - No data to display   ED Discharge Orders    None       Note:  This document was prepared using Dragon voice recognition software and may include unintentional dictation errors.   Lucrezia Starch, MD 10/03/20 [phone]

## 2020-10-03 NOTE — ED Notes (Signed)
Pt continuing to speak in sentences that do not make sense. Pt states "I cannot wear scrubs to a hotel and I do not have any bruises except for when I played ping pong and it sticked"

## 2020-10-03 NOTE — Progress Notes (Signed)
Patient transferred from [facility] ED after receiving Ativan. She says she is tired and just wants to go to bed. Skin search done with Cleo, LPN. Patient has scars on her abdomen from an appendectomy. No contraband found. Patient denies SI and contracts for safety. Resting in bed with eyes closed

## 2020-10-03 NOTE — ED Notes (Signed)
Report to include Situation, Background, Assessment, and Recommendations received from Amy RN. Patient alert and oriented, warm and dry, in no acute distress. Patient denies SI, HI, AVH and pain. Patient made aware of Q15 minute rounds and Rover and Officer presence for their safety. Patient instructed to come to me with needs or concerns.   

## 2020-10-04 DIAGNOSIS — F3112 Bipolar disorder, current episode manic without psychotic features, moderate: Secondary | ICD-10-CM | POA: Diagnosis not present

## 2020-10-04 MED ORDER — OLANZAPINE 5 MG PO TABS
5.0000 mg | ORAL_TABLET | Freq: Four times a day (QID) | ORAL | Status: DC | PRN
  Administered 2020-10-04: 5 mg via ORAL
  Filled 2020-10-04: qty 1

## 2020-10-04 MED ORDER — ARIPIPRAZOLE 5 MG PO TABS
5.0000 mg | ORAL_TABLET | Freq: Once | ORAL | Status: AC
  Administered 2020-10-04: 5 mg via ORAL
  Filled 2020-10-04: qty 1

## 2020-10-04 MED ORDER — ARIPIPRAZOLE 10 MG PO TABS
10.0000 mg | ORAL_TABLET | Freq: Every day | ORAL | Status: DC
  Administered 2020-10-05 – 2020-10-07 (×3): 10 mg via ORAL
  Filled 2020-10-04 (×3): qty 1

## 2020-10-04 NOTE — Plan of Care (Signed)
  Problem: Education: Goal: Knowledge of [facility] Education information/materials will improve Outcome: Progressing Goal: Emotional status will improve Outcome: Progressing Goal: Mental status will improve Outcome: Progressing Goal: Verbalization of understanding the information provided will improve Outcome: Progressing   Problem: Activity: Goal: Interest or engagement in activities will improve Outcome: Progressing Goal: Sleeping patterns will improve Outcome: Progressing   Problem: Coping: Goal: Ability to verbalize frustrations and anger appropriately will improve Outcome: Progressing Goal: Ability to demonstrate self-control will improve Outcome: Progressing   Problem: Health Behavior/Discharge Planning: Goal: Identification of resources available to assist in meeting health care needs will improve Outcome: Progressing Goal: Compliance with treatment plan for underlying cause of condition will improve Outcome: Progressing   Problem: Physical Regulation: Goal: Ability to maintain clinical measurements within normal limits will improve Outcome: Progressing   Problem: Safety: Goal: Periods of time without injury will increase Outcome: Progressing   Problem: Education: Goal: Utilization of techniques to improve thought processes will improve Outcome: Progressing Goal: Knowledge of the prescribed therapeutic regimen will improve Outcome: Progressing   Problem: Activity: Goal: Interest or engagement in leisure activities will improve Outcome: Progressing Goal: Imbalance in normal sleep/wake cycle will improve Outcome: Progressing   Problem: Coping: Goal: Coping ability will improve Outcome: Progressing Goal: Will verbalize feelings Outcome: Progressing   Problem: Health Behavior/Discharge Planning: Goal: Ability to make decisions will improve Outcome: Progressing Goal: Compliance with therapeutic regimen will improve Outcome: Progressing    Problem: Role Relationship: Goal: Will demonstrate positive changes in social behaviors and relationships Outcome: Progressing   Problem: Safety: Goal: Ability to disclose and discuss suicidal ideas will improve Outcome: Progressing Goal: Ability to identify and utilize support systems that promote safety will improve Outcome: Progressing   Problem: Self-Concept: Goal: Will verbalize positive feelings about self Outcome: Progressing Goal: Level of anxiety will decrease Outcome: Progressing

## 2020-10-04 NOTE — H&P (Signed)
Psychiatric Admission Assessment Adult  Patient Identification: [name] MRN:  [id number] Date of Evaluation:  10/04/2020 Chief Complaint:  Bipolar affective disorder, current episode manic ([place]) [F31.10] Principal Diagnosis: <principal problem not specified> Diagnosis:  Active Problems:   Bipolar affective disorder, current episode manic ([place])  [name] is a 50yo F with psych history of bipolar disorder, who was admitted to [facility] unit due to acute mania in settings of being off psychotropic medications.  History of Present Illness: Per chart, patient was brought to the ER on 4/29 by her family and requested to be placed back on her Abilify medication. Patient seen by psych NP in ER and the initial decision was to give her a refill for Abilify and follow-up with outpatient psychiatrist on 5/11, the patient was discharged home. However, the patient was brought back to ER the next day on 4/30 by her family because she reportedly made statements to kill self while at home. Patient was reevaluated and recommended for an inpatient psych admission.  Patient seen in [facility] unit today. Patient reports feeling upset due to being admitted and states she has no reason to be here. She reports she was ready to take her Abilify at home and planned to see [name], her psychiatrist, on 5/11. She denies making suicidal statements and denies current suicidal thoughts, plans. She reports feeling anxious due to believe that her admission is a part of conspiracy against her by her husband, who, in her belief, is planning to sell her family house while she is in the hospital. 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 She denies homicidal thoughts, Denies auditory or visual hallucinations. She reports good sleep and appetite. Denies any physical complaints. She denies substance use. She is asking for immediate discharge and after  she realized she is staying in the hospital today, she stopped talking and asked to leave her alone.  Associated Signs/Symptoms: Mania: increased energy; racing thoughts; irritable mood; flight of ideas; grandiosity; distractibility; fast speech; talkativeness; increased goal directed activity.  Past Psychiatric History:  Previous psych diagnosis: bipolar disorder Patient had previous [facility] admissions Outpatient psychiatrist: Dr. Adele Patrick in [place], per patient, she has a scheduled apointment on 5/11. Past psychotropic medications: Zyprexa, Lamictal, Clonazepam, Abilify.  Social History: Married Has two children Works No childhood trauma hx Denies substance use  Total Time spent with patient: 30 minutes  Alcohol Screening: 1. How often do you have a drink containing alcohol?: Never 2. How many drinks containing alcohol do you have on a typical day when you are drinking?: 1 or 2 3. How often do you have six or more drinks on one occasion?: Never AUDIT-C Score: 0 4. How often during the last year have you found that you were not able to stop drinking once you had started?: Never 5. How often during the last year have you failed to do what was normally expected from you because of drinking?: Never 6. How often during the last year have you needed a first drink in the morning to get yourself going after a heavy drinking session?: Never 7. How often during the last year have you had a feeling of guilt of remorse after drinking?: Never 8. How often during the last year have you been unable to remember what happened the night before because you had been drinking?: Never 9. Have you or someone else been injured as a result  of your drinking?: No 10. Has a relative or friend or a doctor or another health worker been concerned about your drinking or suggested you cut down?: No Alcohol Use Disorder Identification Test Final Score (AUDIT): 0  Past Medical History:  Past Medical History:   Diagnosis Date  . Akathisia 12/17/2014  . Bipolar affective ([place])   . Restless leg syndrome   . Schizoaffective disorder ([place])   . Schizoaffective disorder [facility])     Past Surgical History:  Procedure Laterality Date  . CESAREAN SECTION    . LAPAROSCOPIC APPENDECTOMY N/A 07/02/2020   Procedure: APPENDECTOMY LAPAROSCOPIC;  Surgeon: Brittany Maudlin, MD;  Location: [facility] ORS;  Service: General;  Laterality: N/A;   Family History:  Family History  Problem Relation Age of Onset  . Drug abuse Maternal Aunt    Family Psychiatric  History: unknown Tobacco Screening:   Social History:  Social History   Substance and Sexual Activity  Alcohol Use No     Social History   Substance and Sexual Activity  Drug Use No        Allergies:  No Known Allergies Lab Results:  Results for orders placed or performed during the hospital encounter of 10/03/20 (from the past 48 hour(s))  Comprehensive metabolic panel     Status: Abnormal   Collection Time: 10/03/20 10:56 AM  Result Value Ref Range   Sodium 140 135 - 145 mmol/L   Potassium 4.0 3.5 - 5.1 mmol/L   Chloride 111 98 - 111 mmol/L   CO2 19 (L) 22 - 32 mmol/L   Glucose, Bld 118 (H) 70 - 99 mg/dL    Comment: Glucose reference range applies only to samples taken after fasting for at least 8 hours.   BUN 14 6 - 20 mg/dL   Creatinine, Ser 0.70 0.44 - 1.00 mg/dL   Calcium 8.9 8.9 - 10.3 mg/dL   Total Protein 8.0 6.5 - 8.1 g/dL   Albumin 4.4 3.5 - 5.0 g/dL   AST 22 15 - 41 U/L   ALT 22 0 - 44 U/L   Alkaline Phosphatase 88 38 - 126 U/L   Total Bilirubin 0.5 0.3 - 1.2 mg/dL   GFR, Estimated >60 >60 mL/min    Comment: (NOTE) Calculated using the CKD-EPI Creatinine Equation (2021)    Anion gap 10 5 - 15    Comment: Performed at [facility], [address] [place], [place] [address]  Ethanol     Status: None   Collection Time: 10/03/20 10:56 AM  Result Value Ref Range   Alcohol, Ethyl (B) <10 <10 mg/dL    Comment:  (NOTE) Lowest detectable limit for serum alcohol is 10 mg/dL.  For medical purposes only. Performed at [facility], [address] [place], [place] [address]   Salicylate level     Status: Abnormal   Collection Time: 10/03/20 10:56 AM  Result Value Ref Range   Salicylate Lvl <0.9 (L) 7.0 - 30.0 mg/dL    Comment: Performed at [facility], [place], [place] [address]  Acetaminophen level     Status: Abnormal   Collection Time: 10/03/20 10:56 AM  Result Value Ref Range   Acetaminophen (Tylenol), Serum <10 (L) 10 - 30 ug/mL    Comment: (NOTE) Therapeutic concentrations vary significantly. A range of 10-30 ug/mL  may be an effective concentration for many patients. However, some  are best treated at concentrations outside of this range. Acetaminophen concentrations >150 ug/mL at 4 hours after  ingestion  and >50 ug/mL at 12 hours after ingestion are often associated with  toxic reactions.  Performed at [facility], [address] [place], [place] [address]   cbc     Status: Abnormal   Collection Time: 10/03/20 10:56 AM  Result Value Ref Range   WBC 11.7 (H) 4.0 - 10.5 K/uL   RBC 4.65 3.87 - 5.11 MIL/uL   Hemoglobin 13.6 12.0 - 15.0 g/dL   HCT 41.1 36.0 - 46.0 %   MCV 88.4 80.0 - 100.0 fL   MCH 29.2 26.0 - 34.0 pg   MCHC 33.1 30.0 - 36.0 g/dL   RDW 14.5 11.5 - 15.5 %   Platelets 349 150 - 400 K/uL   nRBC 0.0 0.0 - 0.2 %    Comment: Performed at [facility], [address], [place], [place] [address]  Urine Drug Screen, Qualitative     Status: None   Collection Time: 10/03/20 10:56 AM  Result Value Ref Range   Tricyclic, Ur Screen NONE DETECTED NONE DETECTED   Amphetamines, Ur Screen NONE DETECTED NONE DETECTED   MDMA (Ecstasy)Ur Screen NONE DETECTED NONE DETECTED   Cocaine Metabolite,Ur [place] NONE DETECTED NONE DETECTED   Opiate, Ur Screen NONE DETECTED NONE DETECTED   Phencyclidine (PCP) Ur S NONE DETECTED NONE  DETECTED   Cannabinoid 50 Ng, Ur [place] NONE DETECTED NONE DETECTED   Barbiturates, Ur Screen NONE DETECTED NONE DETECTED   Benzodiazepine, Ur Scrn NONE DETECTED NONE DETECTED   Methadone Scn, Ur NONE DETECTED NONE DETECTED    Comment: (NOTE) Tricyclics + metabolites, urine    Cutoff 1000 ng/mL Amphetamines + metabolites, urine  Cutoff 1000 ng/mL MDMA (Ecstasy), urine              Cutoff 500 ng/mL Cocaine Metabolite, urine          Cutoff 300 ng/mL Opiate + metabolites, urine        Cutoff 300 ng/mL Phencyclidine (PCP), urine         Cutoff 25 ng/mL Cannabinoid, urine                 Cutoff 50 ng/mL Barbiturates + metabolites, urine  Cutoff 200 ng/mL Benzodiazepine, urine              Cutoff 200 ng/mL Methadone, urine                   Cutoff 300 ng/mL  The urine drug screen provides only a preliminary, unconfirmed analytical test result and should not be used for non-medical purposes. Clinical consideration and professional judgment should be applied to any positive drug screen result due to possible interfering substances. A more specific alternate chemical method must be used in order to obtain a confirmed analytical result. Gas chromatography / mass spectrometry (GC/MS) is the preferred confirm atory method. Performed at [facility] [address] [place], [place] [address]     Blood Alcohol level:  Lab Results  Component Value Date   [facility] <10 [id number]    Metabolic Disorder Labs:  Lab Results  Component Value Date   HGBA1C 6.2 03/27/2020   No results found for: PROLACTIN Lab Results  Component Value Date   CHOL 188 03/27/2020   TRIG 170.0 (H) 03/27/2020   HDL 49.00 03/27/2020   CHOLHDL 4 03/27/2020   VLDL 34.0 03/27/2020   LDLCALC 105 (H) 03/27/2020   LDLCALC 99 12/18/2014    Current Medications: Current Facility-Administered Medications  Medication Dose Route  Frequency Provider Last Rate Last Admin  . acetaminophen (TYLENOL) tablet 650 mg  650 mg  Oral Q6H PRN Larita Fife, MD      . alum & mag hydroxide-simeth (MAALOX/MYLANTA) 200-200-20 MG/5ML suspension 30 mL  30 mL Oral Q4H PRN Larita Fife, MD      . Derrill Memo ON 10/05/2020] ARIPiprazole (ABILIFY) tablet 10 mg  10 mg Oral Daily Aislee Landgren, MD      . ARIPiprazole (ABILIFY) tablet 5 mg  5 mg Oral Once Larita Fife, MD      . hydrOXYzine (ATARAX/VISTARIL) tablet 25 mg  25 mg Oral TID PRN Larita Fife, MD      . OLANZapine zydis (ZYPREXA) disintegrating tablet 5 mg  5 mg Oral Q8H PRN Larita Fife, MD       And  . LORazepam (ATIVAN) tablet 1 mg  1 mg Oral PRN Larita Fife, MD       And  . ziprasidone (GEODON) injection 20 mg  20 mg Intramuscular PRN Kahleb Mcclane, Delrae Rend, MD      . magnesium hydroxide (MILK OF MAGNESIA) suspension 30 mL  30 mL Oral Daily PRN Brydan Downard, Delrae Rend, MD      . traZODone (DESYREL) tablet 50 mg  50 mg Oral QHS PRN Larita Fife, MD       PTA Medications: Medications Prior to Admission  Medication Sig Dispense Refill Last Dose  . ARIPiprazole (ABILIFY) 10 MG tablet Take 1 tablet (10 mg total) by mouth daily. 30 tablet 0   . traZODone (DESYREL) 100 MG tablet Take 1 tablet (100 mg total) by mouth at bedtime as needed. 90 tablet 3     Musculoskeletal: Strength & Muscle Tone: within normal limits Gait & Station: normal Patient leans: N/A  Psychiatric Specialty Exam: Appearance:  CF, appearing stated age, appears well-nourished;  wearing appropriate to the situation casual clothes, with fair grooming and hygiene. Normal level of alertness and appropriate facial expression.  Attitude/Behavior: irritable, limitedly-cooperative, appropriate eye contact.  Motor: WNL; dyskinesias not evident. Gait appears in full range.  Speech: pressured  Mood: upset  Affect: labile  Thought process: patient appears coherent, somewhat disorganized, illogical, tangential.  Thought content: patient denies suicidal thoughts, denies homicidal thoughts. Patient expresses delusions.  Thought  perception: patient denies auditory and visual hallucinations. Did not appear internally stimulated.  Cognition: patient is alert and oriented in self, place, date.  Insight: poor, in regards of understanding of presence, nature, cause, and significance of mental or emotional problem.  Judgement: poor, in regards of ability to make good decisions concerning the appropriate thing to do in various situations, including ability to form opinions regarding their mental health condition.   Physical Exam: Physical Exam Vitals and nursing note reviewed.  Constitutional:      Appearance: Normal appearance.  HENT:     Head: Normocephalic and atraumatic.  Eyes:     Extraocular Movements: Extraocular movements intact.     Pupils: Pupils are equal, round, and reactive to light.  Cardiovascular:     Rate and Rhythm: Normal rate and regular rhythm.  Pulmonary:     Effort: Pulmonary effort is normal.     Breath sounds: Normal breath sounds.  Abdominal:     General: Abdomen is flat.     Palpations: Abdomen is soft.  Musculoskeletal:        General: Normal range of motion.     Cervical back: Normal range of motion.  Neurological:     General: No focal deficit present.  Mental Status: She is alert and oriented to person, place, and time.    Review of Systems  Constitutional: Negative for chills and fever.  HENT: Negative for hearing loss.   Eyes: Negative for blurred vision.  Respiratory: Negative for shortness of breath.   Cardiovascular: Negative for chest pain.  Gastrointestinal: Negative for constipation and diarrhea.  Neurological: Negative for focal weakness.   Blood pressure (!) 149/86, pulse 91, temperature 97.9 F (36.6 C), temperature source Oral, resp. rate 18, height 5\' 2"  (1.575 m), weight 93.4 kg, SpO2 99 %. Body mass index is 37.66 kg/m.  Treatment Plan Summary: Ms. Dauer is a 50yo F with psych history of bipolar disorder, who was admitted to [facility] unit due to acute mania  in settings of being off psychotropic medications. Patient seen, chart review. Patient expresses symptoms of acute mania; she has no insight in her current mental state and expresses poor judjment. Patient continues to meet criteria for an inpatient psych admission. -inpatient psychiatric admission will be continued. -patient will be integrated in the milieu.   -patient will be encouraged to attend groups.   -Medications: Abilify was reportedly effective in the past and was restarted yesterday and increased today to 10 mg to assist with manic symptoms.   -We will attempt to collect collateral information. -Disposition will be determined after the patient is stabilized. Patient has an outpatient [place] care set up.   Daily contact with patient to assess and evaluate symptoms and progress in treatment and Medication management  Observation Level/Precautions:  15 minute checks  Laboratory:  n/a  Psychotherapy:    Medications:    Consultations:    Discharge Concerns:    Estimated LOS:  Other:     Physician Treatment Plan for Primary Diagnosis: <principal problem not specified> Long Term Goal(s): Improvement in symptoms so as ready for discharge  Short Term Goals: Ability to identify changes in lifestyle to reduce recurrence of condition will improve, Ability to verbalize feelings will improve, Ability to disclose and discuss suicidal ideas, Ability to demonstrate self-control will improve, Ability to identify and develop effective coping behaviors will improve, Ability to maintain clinical measurements within normal limits will improve, Compliance with prescribed medications will improve and Ability to identify triggers associated with substance abuse/mental health issues will improve  Physician Treatment Plan for Secondary Diagnosis: Active Problems:   Bipolar affective disorder, current episode manic ([place])  Long Term Goal(s): Improvement in symptoms so as ready for discharge  Short Term  Goals: Ability to identify changes in lifestyle to reduce recurrence of condition will improve, Ability to verbalize feelings will improve, Ability to disclose and discuss suicidal ideas, Ability to demonstrate self-control will improve, Ability to identify and develop effective coping behaviors will improve, Ability to maintain clinical measurements within normal limits will improve, Compliance with prescribed medications will improve and Ability to identify triggers associated with substance abuse/mental health issues will improve  I certify that inpatient services furnished can reasonably be expected to improve the patient's condition.    Larita Fife, MD 5/1/20229:21 AM

## 2020-10-04 NOTE — BHH Counselor (Signed)
Adult Comprehensive Assessment  Patient ID: NGOC DETJEN, female   DOB: 12/20/1970, 50 y.o.   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 Family Relationships: Patient denies stressors Financial / Lack of resources (include bankruptcy): Patient denies stressors Housing / Lack of housing: Patient denies stressors Physical health (include injuries & life threatening diseases): Patient denies stressors Social relationships: Patient is currently having issues with her husband Substance abuse: Patient reports taking 2 Xanax's the other night to sleep when she was at her parent's house. Bereavement / Loss: Patient denies stressors.  Living/Environment/Situation:  Living Arrangements: Spouse/significant other Living conditions (as described by patient or guardian): Patient reports she normally lives with her husband but for the past 4 days she has been staying at her mom and dad's home because her husband is afraid she will hurt herself. Who else lives in the home?: Patient's husband How long has patient lived in current situation?: 25 years What is atmosphere in current home: Chaotic,Comfortable  Family History:  Marital status: Married Number of Years Married: 38 What types of issues is patient dealing with in the relationship?: Recent strained due to symptoms of bipolar Additional relationship information: None reported Are you sexually active?: Yes What is your sexual orientation?:  Heterosexual Has your sexual activity been affected by drugs, alcohol, medication, or emotional stress?: None reported Does patient have children?: Yes How many children?: 2 How is patient's relationship with their children?: Patient states that it is good but does not want them involved in this.  Childhood History:  By whom was/is the patient raised?: Both parents Additional childhood history information: None reported Description of patient's relationship with caregiver when they were a child: Patient states it's good Patient's description of current relationship with people who raised him/her: Patient states it's good How were you disciplined when you got in trouble as a child/adolescent?: Patient reports that she had spankings. Does patient have siblings?: No Did patient suffer any verbal/emotional/physical/sexual abuse as a child?: No Did patient suffer from severe childhood neglect?: No Has patient ever been sexually abused/assaulted/raped as an adolescent or adult?: No Was the patient ever a victim of a crime or a disaster?: No Witnessed domestic violence?: No Has patient been affected by domestic violence as an adult?: No  Education:  Highest grade of school patient has completed: Patient reports some college Currently a Ship broker?: No Learning disability?: No  Employment/Work Situation:   Employment situation: Employed 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 What is the longest time patient has a held a job?: Patient did not answer Where was the patient employed at that time?: N/A Has patient ever been in the [employer]?: No  Financial Resources:   Financial resources: Private  insurance,Income from spouse Does patient have a Wellsite geologist  payee or guardian?: No  Alcohol/Substance Abuse:   What has been your use of drugs/alcohol within the last 12 months?: Patient endorses xanax use If attempted suicide, did drugs/alcohol play a role in this?: No Alcohol/Substance Abuse Treatment Hx: Denies past history Has alcohol/substance abuse ever caused legal problems?: No  Social Support System:   Patient's Community Support System: Good Describe Community Support System: Patient's parents, daughters and her husband Type of faith/religion: Darrick Meigs How does patient's faith help to cope with current illness?: Patient reports it helps her tremendously  Leisure/Recreation:   Do You Have Hobbies?: No  Strengths/Needs:   What is the patient's perception of their strengths?: 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 Patient states they can use these personal strengths during their treatment to contribute to their recovery: Patient reports it keeps her going Patient states these barriers may affect/interfere with their treatment: Patient denied any barriers Patient states these barriers may affect their return to the community: Patient denied any barried to returning to the community. Other important information patient would like considered in planning for their treatment: N.A  Discharge Plan:   Currently receiving community mental health services: Yes (From Whom) (Patient reports [facility].) Patient states concerns and preferences for aftercare planning are: Patient reports that she has an assessment with [facility] on 5/2 and will be doing therapy with them. Patient states they will know when they are safe and ready for discharge when: "I am ready now" Does patient have access to transportation?: Yes (Patient's mother) Does patient have financial barriers related to discharge medications?: No Patient description of barriers related to  discharge medications: N/A Will patient be returning to same living situation after discharge?: Yes  Summary/Recommendations:   Summary and Recommendations (to be completed by the evaluator): Patient is a 50 year old female from [place], [place]. Patient arrived to the hospital due to her family because she reportedly made statements to kill self while at home. Patient's husband reports that she was screaming and yelling at him. She reports feeling anxious due to believe that her admission is a part of conspiracy against her by her husband, who, in her belief, is planning to sell her family house while she is in the hospital. 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 Recommendations include: crisis stabilization, therapeutic milieu, encourage group attendance and participation, medication management for detox/mood stabilization and development of comprehensive mental wellness/sobriety plan.  Trecia Rogers. 10/04/2020

## 2020-10-04 NOTE — BHH Suicide Risk Assessment (Signed)
[facility] Admission Suicide Risk Assessment   Nursing information obtained from:  Patient Demographic factors:  Caucasian Current Mental Status:  Suicidal ideation indicated by others Loss Factors:  NA Historical Factors:  Impulsivity Risk Reduction Factors:  Positive social support,Living with another person, especially a relative  Total Time spent with patient: 30 minutes Principal Problem: <principal problem not specified> Diagnosis:  Active Problems:   Bipolar affective disorder, current episode manic ([place])  Subjective Data:  [name] is a 50yo F with psych history of bipolar disorder, who was admitted to [facility] unit due to acute mania in settings of being off psychotropic medications. Patient seen, chart review. Patient expresses symptoms of acute mania; she has no insight in her current mental state and expresses poor judjment. Patient continues to meet criteria for an inpatient psych admission.   CLINICAL FACTORS:   Bipolar Disorder:   Mixed State  Assets  Assets:Communication Skills; Desire for Improvement; Financial Resources/Insurance; Housing; Intimacy; Leisure Time; Physical Health; Resilience; Social Support; Talents/Skills; Transportation  COGNITIVE FEATURES THAT CONTRIBUTE TO RISK:  None    SUICIDE RISK:   Mild:  Suicidal ideation of limited frequency, intensity, duration, and specificity.  There are no identifiable plans, no associated intent, mild dysphoria and related symptoms, good self-control (both objective and subjective assessment), few other risk factors, and identifiable protective factors, including available and accessible social support.  PLAN OF CARE:  -inpatient psychiatric admission will be continued. -patient will be integrated in the milieu.   -patient will be encouraged to attend groups.   -Medications: Abilify was reportedly effective in the past and was restarted yesterday and increased today to 10 mg to assist with manic symptoms.   -We will attempt  to collect collateral information. -Disposition will be determined after the patient is stabilized. Patient has an outpatient [facility] care set up.  I certify that inpatient services furnished can reasonably be expected to improve the patient's condition.   Larita Fife, MD 10/04/2020, 9:53 AM

## 2020-10-04 NOTE — Progress Notes (Signed)
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 She says he is very domineering and controlling. She also said she has a lot of friends who have left their husbands. Encouraged patient to get back on her medication, get into therapy, and then decide whether she wants to leave her husband. Encouraged her to get marital therapy and she said "he won't go". Patient seems unsteady on her feet. Encouraged her to get some rest and did some teaching about bipolar and emphasized that bipolar is a lifelong condition and will require lifelong medication compliance to manage it. Patient agreed to stay in bed and get some rest. Informed patient that she was awake most of the night last night looking for coffee to wake her up after being given ativan in the ED and that she needed her rest. Denies SI

## 2020-10-04 NOTE — Progress Notes (Signed)
D: Pt alert and oriented. Pt rates depression 0/10, hopelessness 0/10, and anxiety 0/10. Pt goal: "myself and getting home". Pt reports energy level as normal and concentration as being good. Pt reports sleep last night as being good. Pt did receive medications for sleep and did find them helpful. Pt denies experiencing any pain at this time. Pt denies experiencing any SI/HI, or AVH at this time.   Pt was given a PRN medication after having a outburst with the txing MD in regards to wanting to discharging and not understanding why she is here. Pt also has concerns with husband and work while here.  A: Scheduled medications administered to pt, per MD orders. Support and encouragement provided. Frequent verbal contact made. Routine safety checks conducted q15 minutes.   R: No adverse drug reactions noted. Pt verbally contracts for safety at this time. Pt complaint with medications and treatment plan. Pt interacts well with others on the unit. Pt remains safe at this time. Will continue to monitor.

## 2020-10-04 NOTE — Progress Notes (Signed)
Patient keeping her bra at the bedside. Seems disoriented at times to time and situation. Was out of bed asking for coffee so she could wake up and after being oriented to the fact that it was 3am she continued to get up at intervals and resist sleep. Came into the nurses station and said "I thought I was at work". Wandered the halls at intervals seeming confused after being oriented to the unit

## 2020-10-05 ENCOUNTER — Other Ambulatory Visit: Payer: 59

## 2020-10-05 DIAGNOSIS — F3112 Bipolar disorder, current episode manic without psychotic features, moderate: Secondary | ICD-10-CM | POA: Diagnosis not present

## 2020-10-05 MED ORDER — ARIPIPRAZOLE ER 400 MG IM SRER
300.0000 mg | INTRAMUSCULAR | Status: DC
  Administered 2020-10-06: 300 mg via INTRAMUSCULAR
  Filled 2020-10-05 (×4): qty 2

## 2020-10-05 NOTE — Progress Notes (Signed)
Recreation Therapy Notes  INPATIENT RECREATION TR PLAN  Patient Details Name: [name] MRN: [id number] DOB: December 13, 1970 Today's Date: 10/05/2020  Rec Therapy Plan Is patient appropriate for Therapeutic Recreation?: Yes Treatment times per week: at least 3 Estimated Length of Stay: 5-7 days TR Treatment/Interventions: Group participation (Comment)  Discharge Criteria Treatment plan/goals/alternatives discussed and agreed upon by:: Patient/family  Discharge Summary     Anish Vana 10/05/2020, 1:53 PM

## 2020-10-05 NOTE — BHH Group Notes (Signed)
LCSW Group Therapy Note   10/05/2020 2:29 PM  Type of Therapy and Topic:  Group Therapy:  Overcoming Obstacles   Participation Level:  Active   Description of Group:    In this group patients will be encouraged to explore what they see as obstacles to their own wellness and recovery. They will be guided to discuss their thoughts, feelings, and behaviors related to these obstacles. The group will process together ways to cope with barriers, with attention given to specific choices patients can make. Each patient will be challenged to identify changes they are motivated to make in order to overcome their obstacles. This group will be process-oriented, with patients participating in exploration of their own experiences as well as giving and receiving support and challenge from other group members.   Therapeutic Goals: 1. Patient will identify personal and current obstacles as they relate to admission. 2. Patient will identify barriers that currently interfere with their wellness or overcoming obstacles.  3. Patient will identify feelings, thought process and behaviors related to these barriers. 4. Patient will identify two changes they are willing to make to overcome these obstacles:      Summary of Patient Progress Patient was present for the entirety of the group session. Patient was an active listener and participated in the topic of discussion, provided helpful advice to others, and added nuance to topic of conversation. Patient shared that she want to refrain from returning to "old habits."       Therapeutic Modalities:   Cognitive Behavioral Therapy Solution Focused Therapy Motivational Interviewing Relapse Prevention Therapy  Paulla Dolly, MSW, [place] 10/05/2020 2:29 PM

## 2020-10-05 NOTE — Progress Notes (Signed)
Recreation Therapy Notes  Date: 10/05/2020  Time: 10:00 am   Location: Craft room   Behavioral response: Appropriate  Intervention Topic: Self-esteem    Discussion/Intervention:  Group content today was focused on self-esteem. Patient defined self-esteem and where it comes form. The group described reasons self-esteem is important. Individuals stated things that impact self-esteem and positive ways to improve self-esteem. The group participated in the intervention "Collage of Me" where patients were able to create a collage of positive things that makes them who they are. Clinical Observations/Feedback: Patient came to group and defined self-esteem as how you feel about yourself in a positive way. Participant explained that peers and family affect self-esteem. She expressed that she can improve her self-esteem by trying to look her best everyday.  Patient stated that self-esteem is important to because it can affect others around you. Individual was social with staff and peers while participating in the intervention. [name] LRT/CTRS          [name] 10/05/2020 12:12 PM

## 2020-10-05 NOTE — Progress Notes (Signed)
[facility] MD Progress Note  10/05/2020 7:26 PM [name]  MRN:  [id number] Subjective: Follow-up for this 50 year old woman with a history of bipolar disorder.  Patient seen and chart reviewed.  Patient appears to be doing better today than she was on admission by yesterday's note.  She was willing to talk with me quite a bit and showed some degree of insight.  Admitted that she had a history of mental health problems.  Initially claimed that she had been taking her Abilify at home but then admitted she had not been on medicine in a couple of years.  Patient is still highly animated and emotional.  Tearful and pressured.  Denies suicidal ideation but appears to still be paranoid about her family.  She specifically asked me if she could consider getting the long-acting Abilify shot.  She said that she thinks that will help her stay compliant with medicine in the future Principal Problem: <principal problem not specified> Diagnosis: Active Problems:   Bipolar affective disorder, current episode manic ([place])  Total Time spent with patient: 30 minutes  Past Psychiatric History: Past history of bipolar disorder but recently noncompliant.  Did well on Abilify in the past  Past Medical History:  Past Medical History:  Diagnosis Date  . Akathisia 12/17/2014  . Bipolar affective ([place])   . Restless leg syndrome   . Schizoaffective disorder ([place])   . Schizoaffective disorder [facility])     Past Surgical History:  Procedure Laterality Date  . CESAREAN SECTION    . LAPAROSCOPIC APPENDECTOMY N/A 07/02/2020   Procedure: APPENDECTOMY LAPAROSCOPIC;  Surgeon: Fredirick Maudlin, MD;  Location: [facility] ORS;  Service: General;  Laterality: N/A;   Family History:  Family History  Problem Relation Age of Onset  . Drug abuse Maternal Aunt    Family Psychiatric  History: See previous Social History:  Social History   Substance and Sexual Activity  Alcohol Use No     Social History   Substance and Sexual Activity   Drug Use No    Social History   Socioeconomic History  . Marital status: Married    Spouse name: [name]  . Number of children: 2  . Years of education: 99  . Highest education level: Some college, no degree  Occupational History  . Not on file  Tobacco Use  . Smoking status: Current Every Day Smoker    Packs/day: 0.50    Years: 10.00    Pack years: 5.00    Types: Cigarettes  . Smokeless tobacco: Never Used  Vaping Use  . Vaping Use: Former  Substance and Sexual Activity  . Alcohol use: No  . Drug use: No  . Sexual activity: Yes  Other Topics Concern  . 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 acetaminophen (TYLENOL) tablet 650  mg  650 mg Oral Q6H PRN Larita Fife, MD      . alum & mag hydroxide-simeth (MAALOX/MYLANTA) 200-200-20 MG/5ML suspension 30 mL  30 mL Oral Q4H PRN Paliy, Alisa, MD      . ARIPiprazole (ABILIFY) tablet 10 mg  10 mg Oral Daily Larita Fife, MD   10 mg at 10/05/20 0742  . ARIPiprazole ER (ABILIFY MAINTENA) injection 300 mg  300 mg Intramuscular Q28 days Verginia Toohey T, MD      . hydrOXYzine (ATARAX/VISTARIL) tablet 25 mg  25 mg Oral TID PRN Larita Fife, MD   25 mg at 10/05/20 0112  . OLANZapine zydis (ZYPREXA) disintegrating tablet 5 mg  5 mg Oral Q8H PRN Larita Fife, MD       And  . LORazepam (ATIVAN) tablet 1 mg  1 mg Oral PRN Larita Fife, MD       And  . ziprasidone (GEODON) injection 20 mg  20 mg  Intramuscular PRN Paliy, Delrae Rend, MD      . magnesium hydroxide (MILK OF MAGNESIA) suspension 30 mL  30 mL Oral Daily PRN Paliy, Alisa, MD      . OLANZapine (ZYPREXA) tablet 5 mg  5 mg Oral Q6H PRN Larita Fife, MD   5 mg at 10/04/20 1300  . traZODone (DESYREL) tablet 50 mg  50 mg Oral QHS PRN Larita Fife, MD   50 mg at 10/04/20 2317    Lab Results: No results found for this or any previous visit (from the past 48 hour(s)).  Blood Alcohol level:  Lab Results  Component Value Date   ETH <10 96/29/5284    Metabolic Disorder Labs: Lab Results  Component Value Date   HGBA1C 6.2 03/27/2020   No results found for: PROLACTIN Lab Results  Component Value Date   CHOL 188 03/27/2020   TRIG 170.0 (H) 03/27/2020   HDL 49.00 03/27/2020   CHOLHDL 4 03/27/2020   VLDL 34.0 03/27/2020   LDLCALC 105 (H) 03/27/2020   LDLCALC 99 12/18/2014    Physical Findings: AIMS: Facial and Oral Movements Muscles of Facial Expression: None, normal Lips and Perioral Area: None, normal Jaw: None, normal Tongue: None, normal,Extremity Movements Upper (arms, wrists, hands, fingers): None, normal Lower (legs, knees, ankles, toes): None, normal, Trunk Movements Neck, shoulders, hips: None, normal, Overall Severity Severity of abnormal movements (highest score from questions above): None, normal Incapacitation due to abnormal movements: None, normal Patient's awareness of abnormal movements (rate only patient's report): No Awareness, Dental Status Current problems with teeth and/or dentures?: No Does patient usually wear dentures?: No  CIWA:    COWS:     Musculoskeletal: Strength & Muscle Tone: within normal limits Gait & Station: normal Patient leans: N/A  Psychiatric Specialty Exam:  Presentation  General Appearance: Appropriate for Environment; Casual  Eye Contact:Good  Speech:Clear and Coherent  Speech Volume:Increased  Handedness:Right   Mood and Affect   Mood:Anxious  Affect:Congruent   Thought Process  Thought Processes:Coherent; Goal Directed  Descriptions of Associations:Intact  Orientation:Full (Time, Place and Person)  Thought Content:Logical  History of Schizophrenia/Schizoaffective disorder:No  Duration of Psychotic Symptoms:Less than six months  Hallucinations:No data recorded Ideas of Reference:Paranoia  Suicidal Thoughts:No data recorded Homicidal Thoughts:No data recorded  Sensorium  Memory:Immediate Fair; Recent Fair; Remote Fair  Judgment:Fair  Insight:Fair   Executive Functions  Concentration:Good  Attention Span:Good  [place]  Language:Good   Psychomotor Activity  Psychomotor Activity:No data recorded  Assets  Assets:Communication Skills; Desire for Improvement; Financial  Resources/Insurance; Housing; Intimacy; Leisure Time; Physical Health; Resilience; Social Support; Talents/Skills; Transportation   Sleep  Sleep:No data recorded   Physical Exam: Physical Exam Vitals and nursing note reviewed.  Constitutional:      Appearance: Normal appearance.  HENT:     Head: Normocephalic and atraumatic.     Mouth/Throat:     Pharynx: Oropharynx is clear.  Eyes:     Pupils: Pupils are equal, round, and reactive to light.  Cardiovascular:     Rate and Rhythm: Normal rate and regular rhythm.  Pulmonary:     Effort: Pulmonary effort is normal.     Breath sounds: Normal breath sounds.  Abdominal:     General: Abdomen is flat.     Palpations: Abdomen is soft.  Musculoskeletal:        General: Normal range of motion.  Skin:    General: Skin is warm and dry.  Neurological:     General: No focal deficit present.     Mental Status: She is alert. Mental status is at baseline.  Psychiatric:        Attention and Perception: Attention normal.        Mood and Affect: Mood is anxious. Affect is labile.        Speech: Speech is tangential.        Behavior: Behavior  is agitated. Behavior is not aggressive or hyperactive.        Thought Content: Thought content is paranoid. Thought content does not include homicidal or suicidal ideation.        Cognition and Memory: Memory is impaired.        Judgment: Judgment is impulsive.    Review of Systems  Constitutional: Negative.   HENT: Negative.   Eyes: Negative.   Respiratory: Negative.   Cardiovascular: Negative.   Gastrointestinal: Negative.   Musculoskeletal: Negative.   Skin: Negative.   Neurological: Negative.   Psychiatric/Behavioral: The patient is nervous/anxious and has insomnia.    Blood pressure (!) 144/89, pulse 85, temperature 98.1 F (36.7 C), temperature source Oral, resp. rate 18, height 5\' 2"  (1.575 m), weight 93.4 kg, SpO2 98 %. Body mass index is 37.66 kg/m.   Treatment Plan Summary: Medication management and Plan At the patient's request we are going to try starting Abilify long-acting with just the 300 mg dosage.  Continue oral for now with the patient understanding she needs to transition over to it.  I think she still is showing significant signs of mania and disorganization and would not recommend discharge tomorrow but told her we should be patient and take things day by day.  No other change to medical plan for now.  Alethia Berthold, MD 10/05/2020, 7:26 PM

## 2020-10-05 NOTE — Plan of Care (Signed)
Patient presents with a better affect than when she was admitted  Problem: Education: Goal: Emotional status will improve Outcome: Progressing Goal: Mental status will improve Outcome: Progressing

## 2020-10-05 NOTE — Progress Notes (Signed)
D: Pt alert and oriented. Pt rates depression 0/10, hopelessness 0/10, and anxiety 0/10.Pt goal: "Staying positive and living for the day". Pt reports concentration as being good. Pt reports sleep last night as being fair. Pt did not receive medications for sleep. Pt denies experiencing any pain at this time. Pt denies experiencing any SI/HI, or AVH at this time.   Pt has spent less time coming up to the nurse's station in comparison to yesterday. Pt presents more calm today in comparison to yesterday as well.  A: Scheduled medications administered to pt, per MD orders. Support and encouragement provided. Frequent verbal contact made. Routine safety checks conducted q15 minutes.   R: No adverse drug reactions noted. Pt verbally contracts for safety at this time. Pt complaint with medications and treatment plan. Pt interacts well with others on the unit. Pt remains safe at this time. Will continue to monitor.

## 2020-10-05 NOTE — Tx Team (Addendum)
Interdisciplinary Treatment and Diagnostic Plan Update  10/05/2020 Time of Session: 9:40am [name] MRN: [id number]  Principal Diagnosis: <principal problem not specified>  Secondary Diagnoses: Active Problems:   Bipolar affective disorder, current episode manic (HCC)   Current Medications:  Current Facility-Administered Medications  Medication Dose Route Frequency Provider Last Rate Last Admin  . acetaminophen (TYLENOL) tablet 650 mg  650 mg Oral Q6H PRN Larita Fife, MD      . alum & mag hydroxide-simeth (MAALOX/MYLANTA) 200-200-20 MG/5ML suspension 30 mL  30 mL Oral Q4H PRN Paliy, Alisa, MD      . ARIPiprazole (ABILIFY) tablet 10 mg  10 mg Oral Daily Paliy, Delrae Rend, MD   10 mg at 10/05/20 0742  . hydrOXYzine (ATARAX/VISTARIL) tablet 25 mg  25 mg Oral TID PRN Larita Fife, MD   25 mg at 10/05/20 0112  . OLANZapine zydis (ZYPREXA) disintegrating tablet 5 mg  5 mg Oral Q8H PRN Larita Fife, MD       And  . LORazepam (ATIVAN) tablet 1 mg  1 mg Oral PRN Larita Fife, MD       And  . ziprasidone (GEODON) injection 20 mg  20 mg Intramuscular PRN Paliy, Delrae Rend, MD      . magnesium hydroxide (MILK OF MAGNESIA) suspension 30 mL  30 mL Oral Daily PRN Paliy, Alisa, MD      . OLANZapine (ZYPREXA) tablet 5 mg  5 mg Oral Q6H PRN Larita Fife, MD   5 mg at 10/04/20 1300  . traZODone (DESYREL) tablet 50 mg  50 mg Oral QHS PRN Larita Fife, MD   50 mg at 10/04/20 2317   PTA Medications: Medications Prior to Admission  Medication Sig Dispense Refill Last Dose  . ARIPiprazole (ABILIFY) 10 MG tablet Take 1 tablet (10 mg total) by mouth daily. 30 tablet 0   . traZODone (DESYREL) 100 MG tablet Take 1 tablet (100 mg total) by mouth at bedtime as needed. 90 tablet 3     Patient Stressors: Marital or family conflict  Patient Strengths: Ability for insight Active sense of humor Average or above average intelligence Capable of independent living Occupational psychologist fund  of knowledge Motivation for treatment/growth Physical Health Religious Affiliation Special hobby/interest Supportive family/friends  Treatment Modalities: Medication Management, Group therapy, Case management,  1 to 1 session with clinician, Psychoeducation, Recreational therapy.   Physician Treatment Plan for Primary Diagnosis: <principal problem not specified> Long Term Goal(s): Improvement in symptoms so as ready for discharge Improvement in symptoms so as ready for discharge   Short Term Goals: Ability to identify changes in lifestyle to reduce recurrence of condition will improve Ability to verbalize feelings will improve Ability to disclose and discuss suicidal ideas Ability to demonstrate self-control will improve Ability to identify and develop effective coping behaviors will improve Ability to maintain clinical measurements within normal limits will improve Compliance with prescribed medications will improve Ability to identify triggers associated with substance abuse/mental health issues will improve Ability to identify changes in lifestyle to reduce recurrence of condition will improve Ability to verbalize feelings will improve Ability to disclose and discuss suicidal ideas Ability to demonstrate self-control will improve Ability to identify and develop effective coping behaviors will improve Ability to maintain clinical measurements within normal limits will improve Compliance with prescribed medications will improve Ability to identify triggers associated with substance abuse/mental health issues will improve  Medication Management: Evaluate patient's response, side effects, and tolerance of medication regimen.  Therapeutic Interventions: 1 to  1 sessions, Unit Group sessions and Medication administration.  Evaluation of Outcomes: Not Met  Physician Treatment Plan for Secondary Diagnosis: Active Problems:   Bipolar affective disorder, current episode manic  ([place])  Long Term Goal(s): Improvement in symptoms so as ready for discharge Improvement in symptoms so as ready for discharge   Short Term Goals: Ability to identify changes in lifestyle to reduce recurrence of condition will improve Ability to verbalize feelings will improve Ability to disclose and discuss suicidal ideas Ability to demonstrate self-control will improve Ability to identify and develop effective coping behaviors will improve Ability to maintain clinical measurements within normal limits will improve Compliance with prescribed medications will improve Ability to identify triggers associated with substance abuse/mental health issues will improve Ability to identify changes in lifestyle to reduce recurrence of condition will improve Ability to verbalize feelings will improve Ability to disclose and discuss suicidal ideas Ability to demonstrate self-control will improve Ability to identify and develop effective coping behaviors will improve Ability to maintain clinical measurements within normal limits will improve Compliance with prescribed medications will improve Ability to identify triggers associated with substance abuse/mental health issues will improve     Medication Management: Evaluate patient's response, side effects, and tolerance of medication regimen.  Therapeutic Interventions: 1 to 1 sessions, Unit Group sessions and Medication administration.  Evaluation of Outcomes: Not Met   RN Treatment Plan for Primary Diagnosis: <principal problem not specified> Long Term Goal(s): Knowledge of disease and therapeutic regimen to maintain health will improve  Short Term Goals: Ability to remain free from injury will improve, Ability to verbalize frustration and anger appropriately will improve, Ability to demonstrate self-control, Ability to participate in decision making will improve, Ability to verbalize feelings will improve, Ability to disclose and discuss suicidal  ideas, Ability to identify and develop effective coping behaviors will improve and Compliance with prescribed medications will improve  Medication Management: RN will administer medications as ordered by provider, will assess and evaluate patient's response and provide education to patient for prescribed medication. RN will report any adverse and/or side effects to prescribing provider.  Therapeutic Interventions: 1 on 1 counseling sessions, Psychoeducation, Medication administration, Evaluate responses to treatment, Monitor vital signs and CBGs as ordered, Perform/monitor CIWA, COWS, AIMS and Fall Risk screenings as ordered, Perform wound care treatments as ordered.  Evaluation of Outcomes: Not Met   LCSW Treatment Plan for Primary Diagnosis: <principal problem not specified> Long Term Goal(s): Safe transition to appropriate next level of care at discharge, Engage patient in therapeutic group addressing interpersonal concerns.  Short Term Goals: Engage patient in aftercare planning with referrals and resources, Increase social support, Increase ability to appropriately verbalize feelings, Increase emotional regulation, Facilitate acceptance of mental health diagnosis and concerns, Identify triggers associated with mental health/substance abuse issues and Increase skills for wellness and recovery  Therapeutic Interventions: Assess for all discharge needs, 1 to 1 time with Social worker, Explore available resources and support systems, Assess for adequacy in community support network, Educate family and significant other(s) on suicide prevention, Complete Psychosocial Assessment, Interpersonal group therapy.  Evaluation of Outcomes: Not Met   Progress in Treatment: Attending groups: No. Participating in groups: No. Taking medication as prescribed: Yes. Toleration medication: Yes. Family/Significant other contact made: No, will contact:  attempts will be made with her mother. Patient  understands diagnosis: Yes. Discussing patient identified problems/goals with staff: Yes. Medical problems stabilized or resolved: Yes. Denies suicidal/homicidal ideation: Yes. Issues/concerns per patient self-inventory: No. Other: none.  New problem(s) identified:  No, Describe:  none.  New Short Term/Long Term Goal(s): medication management for mood stabilization; elimination of SI thoughts; development of comprehensive mental wellness plan.  Patient Goals: "I need some counseling and to take my medicine."   Discharge Plan or Barriers: Pt wants to reconnect with her outpatient provider for medication management and counseling.   Reason for Continuation of Hospitalization: Anxiety Depression Mania Medical Issues Medication stabilization  Estimated Length of Stay: 1-7 days   Recreational Therapy: Patient Stressors: N/A Patient Goal: Patient will engage in groups without prompting or encouragement from LRT x3 group sessions within 5 recreation therapy group sessions.   Attendees: Patient: ARRIA NAIM 10/05/2020 10:21 AM  Physician: Gonzella Lex, MD 10/05/2020 10:21 AM  Nursing: West Pugh, RN 10/05/2020 10:21 AM  RN Care Manager: 10/05/2020 10:21 AM  Social Worker: Chalmers Guest. Guerry Bruin, MSW, [place], [place] 10/05/2020 10:21 AM  Recreational Therapist: Devin Going, LRT  10/05/2020 10:21 AM  Other: Kiva Martinique, MSW, LCSW-A 10/05/2020 10:21 AM  Other: Paulla Dolly, MSW, [place], [place] 10/05/2020 10:21 AM  Other: 10/05/2020 10:21 AM    Scribe for Treatment Team: Shirl Harris, LCSW 10/05/2020 10:21 AM

## 2020-10-05 NOTE — Progress Notes (Signed)
Patient calm and cooperative during assessment denying SI/HI/AVH. Patient presents with a manic affect but had no incidents on the unit. Patient compliant with medication administration per MD orders. Pt observed interacting appropriately with staff and peers on the unit. Patient given education, support, and encouragement to be active in her treatment plan. Patient being monitored Q 15 minutes for safety per unit protocol. Pt remains safe on the unit.

## 2020-10-05 NOTE — Progress Notes (Signed)
Patient finally accepted sleep medication to help her get some rest. She said she was having a hard time slowing her mind down.

## 2020-10-05 NOTE — Progress Notes (Signed)
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)  Frequency of Recreation/Participation: Monthly  Awareness of Community Resources:  Yes  Community Resources:  Church,Gym,Mall  Current Use: Yes  If no, Barriers?:    Expressed Interest in [place]: Yes  County of Residence:  [place]  Patient Main Form of Transportation: Car  Patient Strengths:  Helping others  Patient Identified Areas of Improvement:  Balance  Patient Goal for Hospitalization:  Stay compliant  Current SI (including self-harm):  No  Current HI:  No  Current AVH: No  Staff Intervention Plan: Group Attendance,Collaborate with Interdisciplinary Treatment Team  Consent to Intern Participation: N/A  [name] 10/05/2020, 1:53 PM

## 2020-10-05 NOTE — [facility] Suicide Risk Assessment (Signed)
[place] INPATIENT:  Family/Significant Other Suicide Prevention Education  Suicide Prevention Education:  Contact Attempts: Carole Billings/mother [phone]), has been identified by the patient as the family member/significant other with whom the patient will be residing, and identified as the person(s) who will aid the patient in the event of a mental health crisis.  With written consent from the patient, two attempts were made to provide suicide prevention education, prior to and/or following the patient's discharge.  We were unsuccessful in providing suicide prevention education.  A suicide education pamphlet was given to the patient to share with family/significant other.  Date and time of first attempt: 10/05/20 at 10:56am Date and time of second attempt: Second attempt will be needed.  CSW left HIPPA compliant voicemail with contact information for follow up.  Shirl Harris 10/05/2020, 10:57 AM

## 2020-10-06 ENCOUNTER — Telehealth: Payer: Self-pay

## 2020-10-06 DIAGNOSIS — F3112 Bipolar disorder, current episode manic without psychotic features, moderate: Secondary | ICD-10-CM | POA: Diagnosis not present

## 2020-10-06 MED ORDER — DIPHENHYDRAMINE HCL 25 MG PO CAPS
50.0000 mg | ORAL_CAPSULE | Freq: Every evening | ORAL | Status: DC | PRN
  Administered 2020-10-06: 50 mg via ORAL
  Filled 2020-10-06: qty 2

## 2020-10-06 NOTE — Telephone Encounter (Signed)
Noted appt with arfeen 10/14/20

## 2020-10-06 NOTE — Progress Notes (Addendum)
Patient calm and cooperative during assessment denying SI/HI/AVH. Patient presents with a manic affect but had no incidents on the unit. Patient compliant with medication administration per MD orders. Pt observed interacting appropriately with staff and peers on the unit. Patient given education, support, and encouragement to be active in her treatment plan. Patient being monitored Q 15 minutes for safety per unit protocol. Pt remains safe on the unit.  

## 2020-10-06 NOTE — Progress Notes (Signed)
Since the start of shift, patient has been pacing the halls and talking on the phone. Patient has been calling her supervisor and is worried about missing work. Patient was told that she would be given a letter for work stating the days she was in the hospital. This made patient feel slightly less anxious. Patient denies SI, HI, and AVH. She does not have insight into her situation and feels that she does not need to be here. Patient denies pain, medication side effects, or any other physical problem.   Patient is compliant with Abilify this morning. Long acting injectable was only available in the 400mg  dose from pharmacy, 300mg  dose was ordered. Pharmacy was notified about the situation.   Support and encouragement provided to patient. Patient remains safe on the unit at this time and q15 min safety checks have been maintained.

## 2020-10-06 NOTE — Progress Notes (Signed)
Recreation Therapy Notes   Date: 10/06/2020  Time: 9:30 am   Location: Courtyard   Behavioral response: Appropriate  Intervention Topic: Social- skills    Discussion/Intervention:  Group content on today was focused on social skills. The group defined social skills and identified ways they use social skills. Patients expressed what obstacles they face when trying to be social. Participants described the importance of social skills. The group listed ways to improve social skills and reasons to improve social skills. Individuals had an opportunity to learn new and improve social skills as well as identify their weaknesses. Clinical Observations/Feedback: Patient came to group and defined social skills as opening up a conversation. She explained that certain situations and environments determine how social she is. Individual was social with staff and peers while participating in the intervention. Ahmod Gillespie LRT/CTRS          Ivannah Zody 10/06/2020 11:08 AM

## 2020-10-06 NOTE — Progress Notes (Incomplete)
Patient came to the nurse's station asking to speak with this Probation officer. She stated that she is getting paranoid about being the only one in her own clothes. This writer explained to patient why other members on the unit may be wearing scrubs. She also stated that she doesn't want to be paranoid at night because she doesn't sleep well at night. Patient stated that the Trazodone doesn't help her because she is up every thirty minutes. This Probation officer reached out to MD and patient's PRN medication for sleep was changed to Benadryl.

## 2020-10-06 NOTE — Telephone Encounter (Signed)
[name] called an confirmed your message and understood.

## 2020-10-06 NOTE — Telephone Encounter (Signed)
LM for patient's daughter letting her know that since patient is admitted that I was cancelling tomorrow morning's appointment. That we would get patient rescheduled when was discharged from hospital. I asked that she please call back to let us know she received message.

## 2020-10-06 NOTE — Progress Notes (Signed)
[facility] MD Progress Note  10/06/2020 5:11 PM [name]  MRN:  [id number] Subjective: Follow-up for this 50 year old woman with bipolar disorder.  Patient seen and chart reviewed.  Patient reports she is feeling a little better today.  Feels like her thoughts are more organized.  Still feeling anxious.  Some disruption in sleep at night.  In interview she still gets a little tangential talking about how she still has problems with her husband she needs to work out.  She had not received the Abilify injection this morning but plans were for her to receive it during the day. Principal Problem: Bipolar affective disorder, current episode manic ([place]) Diagnosis: Principal Problem:   Bipolar affective disorder, current episode manic ([place])  Total Time spent with patient: 30 minutes  Past Psychiatric History: Past history of schizoaffective or more likely just bipolar disorder  Past Medical History:  Past Medical History:  Diagnosis Date  . Akathisia 12/17/2014  . Bipolar affective ([place])   . Restless leg syndrome   . Schizoaffective disorder ([place])   . Schizoaffective disorder [facility])     Past Surgical History:  Procedure Laterality Date  . CESAREAN SECTION    . LAPAROSCOPIC APPENDECTOMY N/A 07/02/2020   Procedure: APPENDECTOMY LAPAROSCOPIC;  Surgeon: Fredirick Maudlin, MD;  Location: [facility] ORS;  Service: General;  Laterality: N/A;   Family History:  Family History  Problem Relation Age of Onset  . Drug abuse Maternal Aunt    Family Psychiatric  History: See previous Social History:  Social History   Substance and Sexual Activity  Alcohol Use No     Social History   Substance and Sexual Activity  Drug Use No    Social History   Socioeconomic History  . Marital status: Married    Spouse name: [name]  . Number of children: 2  . Years of education: 2  . Highest education level: Some college, no degree  Occupational History  . Not on file  Tobacco Use  . Smoking status: Current  Every Day Smoker    Packs/day: 0.50    Years: 10.00    Pack years: 5.00    Types: Cigarettes  . Smokeless tobacco: Never Used  Vaping Use  . Vaping Use: Former  Substance and Sexual Activity  . Alcohol use: No  . Drug use: No  . Sexual activity: Yes  Other Topics Concern  . 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 acetaminophen (TYLENOL) tablet 650 mg  650 mg Oral Q6H PRN Larita Fife, MD      . alum & mag hydroxide-simeth (MAALOX/MYLANTA) 200-200-20 MG/5ML suspension 30 mL  30 mL Oral Q4H PRN Paliy, Alisa, MD      . ARIPiprazole (ABILIFY) tablet 10 mg  10 mg Oral Daily Larita Fife, MD   10 mg  at 10/06/20 0729  . ARIPiprazole ER (ABILIFY MAINTENA) injection 300 mg  300 mg Intramuscular Q28 days Crockett Rallo T, MD   300 mg at 10/06/20 1057  . hydrOXYzine (ATARAX/VISTARIL) tablet 25 mg  25 mg Oral TID PRN Larita Fife, MD   25 mg at 10/05/20 0112  . magnesium hydroxide (MILK OF MAGNESIA) suspension 30 mL  30 mL Oral Daily PRN Larita Fife, MD      . OLANZapine (ZYPREXA) tablet 5 mg  5 mg Oral Q6H PRN Larita Fife, MD   5 mg at 10/04/20 1300  . OLANZapine zydis (ZYPREXA) disintegrating tablet 5 mg  5 mg Oral Q8H PRN Larita Fife, MD       And  . ziprasidone (GEODON) injection 20 mg  20 mg Intramuscular PRN Larita Fife, MD      . traZODone (DESYREL) tablet 50 mg  50 mg Oral QHS PRN Larita Fife, MD   50 mg at 10/05/20 2108    Lab  Results: No results found for this or any previous visit (from the past 48 hour(s)).  Blood Alcohol level:  Lab Results  Component Value Date   ETH <10 58/85/0277    Metabolic Disorder Labs: Lab Results  Component Value Date   HGBA1C 6.2 03/27/2020   No results found for: PROLACTIN Lab Results  Component Value Date   CHOL 188 03/27/2020   TRIG 170.0 (H) 03/27/2020   HDL 49.00 03/27/2020   CHOLHDL 4 03/27/2020   VLDL 34.0 03/27/2020   LDLCALC 105 (H) 03/27/2020   LDLCALC 99 12/18/2014    Physical Findings: AIMS: Facial and Oral Movements Muscles of Facial Expression: None, normal Lips and Perioral Area: None, normal Jaw: None, normal Tongue: None, normal,Extremity Movements Upper (arms, wrists, hands, fingers): None, normal Lower (legs, knees, ankles, toes): None, normal, Trunk Movements Neck, shoulders, hips: None, normal, Overall Severity Severity of abnormal movements (highest score from questions above): None, normal Incapacitation due to abnormal movements: None, normal Patient's awareness of abnormal movements (rate only patient's report): No Awareness, Dental Status Current problems with teeth and/or dentures?: No Does patient usually wear dentures?: No  CIWA:    COWS:     Musculoskeletal: Strength & Muscle Tone: within normal limits Gait & Station: normal Patient leans: N/A  Psychiatric Specialty Exam:  Presentation  General Appearance: Appropriate for Environment; Casual  Eye Contact:Good  Speech:Clear and Coherent  Speech Volume:Increased  Handedness:Right   Mood and Affect  Mood:Anxious  Affect:Congruent   Thought Process  Thought Processes:Coherent; Goal Directed  Descriptions of Associations:Intact  Orientation:Full (Time, Place and Person)  Thought Content:Logical  History of Schizophrenia/Schizoaffective disorder:No  Duration of Psychotic Symptoms:Less than six months  Hallucinations:No data recorded Ideas of  Reference:Paranoia  Suicidal Thoughts:No data recorded Homicidal Thoughts:No data recorded  Sensorium  Memory:Immediate Fair; Recent Fair; Remote Fair  Judgment:Fair  Insight:Fair   Executive Functions  Concentration:Good  Attention Span:Good  [place]  Language:Good   Psychomotor Activity  Psychomotor Activity:No data recorded  Assets  Assets:Communication Skills; Desire for Improvement; Financial Resources/Insurance; Housing; Intimacy; Leisure Time; Physical Health; Resilience; Social Support; Talents/Skills; Transportation   Sleep  Sleep:No data recorded   Physical Exam: Physical Exam Vitals and nursing note reviewed.  Constitutional:      Appearance: Normal appearance.  HENT:     Head: Normocephalic and atraumatic.     Mouth/Throat:     Pharynx: Oropharynx is clear.  Eyes:     Pupils: Pupils are equal, round, and reactive to  light.  Cardiovascular:     Rate and Rhythm: Normal rate and regular rhythm.  Pulmonary:     Effort: Pulmonary effort is normal.     Breath sounds: Normal breath sounds.  Abdominal:     General: Abdomen is flat.     Palpations: Abdomen is soft.  Musculoskeletal:        General: Normal range of motion.  Skin:    General: Skin is warm and dry.  Neurological:     General: No focal deficit present.     Mental Status: She is alert. Mental status is at baseline.  Psychiatric:        Mood and Affect: Mood is anxious.        Behavior: Behavior is cooperative.        Thought Content: Thought content normal.        Cognition and Memory: Cognition normal.        Judgment: Judgment is impulsive.    Review of Systems  Constitutional: Negative.   HENT: Negative.   Eyes: Negative.   Respiratory: Negative.   Cardiovascular: Negative.   Gastrointestinal: Negative.   Musculoskeletal: Negative.   Skin: Negative.   Neurological: Negative.   Psychiatric/Behavioral: Negative for depression, hallucinations,  memory loss, substance abuse and suicidal ideas. The patient is nervous/anxious and has insomnia.    Blood pressure 123/79, pulse 81, temperature 98.5 F (36.9 C), temperature source Oral, resp. rate 18, height 5\' 2"  (1.575 m), weight 93.4 kg, SpO2 98 %. Body mass index is 37.66 kg/m.   Treatment Plan Summary: Medication management and Plan Abilify injection 300 mg ordered today.  Lower dose because she is taking a lower oral dose.  Once shot is given we can discontinue the oral.  Supportive counseling and review of treatment goals with patient.  If tolerating medicine and feeling okay we will look to likely discharge tomorrow.  Alethia Berthold, MD 10/06/2020, 5:11 PM

## 2020-10-06 NOTE — B[facility] Group Notes (Signed)
LCSW Group Therapy Note     10/06/2020 2:40 PM     Type of Therapy/Topic:  Group Therapy:  Feelings about Diagnosis     Participation Level:  Active     Description of Group:   This group will allow patients to explore their thoughts and feelings about diagnoses they have received. Patients will be guided to explore their level of understanding and acceptance of these diagnoses. Facilitator will encourage patients to process their thoughts and feelings about the reactions of others to their diagnosis and will guide patients in identifying ways to discuss their diagnosis with significant others in their lives. This group will be process-oriented, with patients participating in exploration of their own experiences, giving and receiving support, and processing challenge from other group members.        Therapeutic Goals:  1.    Patient will demonstrate understanding of diagnosis as evidenced by identifying two or more symptoms of the disorder  2.    Patient will be able to express two feelings regarding the diagnosis  3.    Patient will demonstrate their ability to communicate their needs through discussion and/or role play     Summary of Patient Progress: Patient was present for the entirety of the group session. Patient was an active listener and participated in the topic of discussion, provided helpful advice to others, and added nuance to topic of conversation. She discussed how she was diagnosed at the age of 67 and was surprised but that it made sense. She said she can get frustrated by the internal and external stigma of her mental health diagnosis. She said "why can't I just be normal." Patient stated that work was stressor and that she would try to work on balancing her tasks.   Therapeutic Modalities:   Cognitive Behavioral Therapy  Brief Therapy  Feelings Identification    Maliyah Willets [place], MSW, LCSW-A  10/06/2020 2:40 PM

## 2020-10-06 NOTE — BHH Suicide Risk Assessment (Signed)
[facility] INPATIENT:  Family/Significant Other Suicide Prevention Education  Suicide Prevention Education:  Education Completed; Archie Patten [place]/mother [phone]), has been identified by the patient as the family member/significant other with whom the patient will be residing, and identified as the person(s) who will aid the patient in the event of a mental health crisis (suicidal ideations/suicide attempt).  With written consent from the patient, the family member/significant other has been provided the following suicide prevention education, prior to the and/or following the discharge of the patient.  The suicide prevention education provided includes the following:  Suicide risk factors  Suicide prevention and interventions  National Suicide Hotline telephone number  [facility] assessment telephone number  [facility] Emergency Assistance [place] and/or Residential Mobile Crisis Unit telephone number  Request made of family/significant other to:  Remove weapons (e.g., guns, rifles, knives), all items previously/currently identified as safety concern.    Remove drugs/medications (over-the-counter, prescriptions, illicit drugs), all items previously/currently identified as a safety concern.  The family member/significant other verbalizes understanding of the suicide prevention education information provided.  The family member/significant other agrees to remove the items of safety concern listed above.  [name] shared that pt came into the hospital due to changes in behavior. She stated that pt had not been sleeping, began accusing her husband of doing things behind her back, making business plans, and becoming verbally aggressive. [name] stated that when pt is doing well she is the exact opposite, overly nice, willing to help, and mild mannered, church going at baseline. [name] stated that pt calls her hourly while here which is abnormal. She went on to  state that pt talks nicely until you begin to question any of her decisions which is also unusual behavior for her/the pt. [name] stated that when pt is like this she worries about her safety but not when she is doing well. Mother is supportive and states that pt's husband and father are as well. She shared that she is willing to provide continued encouragement of medication compliance after pt is discharged to maintain good mental health outcomes.    [name] 10/06/2020, 11:09 AM

## 2020-10-06 NOTE — Progress Notes (Signed)
Patient was given Abilify Maintena, patient tolerated it well. EKG was also done and placed in chart.

## 2020-10-06 NOTE — Plan of Care (Signed)
Patient stated to this writer that she is feeling better and is ready to leave, hopefully tomorrow  Problem: Education: Goal: Emotional status will improve Outcome: Progressing Goal: Mental status will improve Outcome: Progressing

## 2020-10-06 NOTE — Progress Notes (Signed)
This writer assumed care of patient at 1500. Patient is in the dayroom, socializing with other members on the unit, without any issues. Patient remains safe on the unit at this time.

## 2020-10-07 ENCOUNTER — Ambulatory Visit: Payer: 59 | Admitting: Family

## 2020-10-07 ENCOUNTER — Encounter: Payer: Self-pay | Admitting: Internal Medicine

## 2020-10-07 DIAGNOSIS — F3112 Bipolar disorder, current episode manic without psychotic features, moderate: Secondary | ICD-10-CM | POA: Diagnosis not present

## 2020-10-07 MED ORDER — ABILIFY MAINTENA 300 MG IM SRER: 300.0000 mg | INTRAMUSCULAR | 1 refills | Status: DC

## 2020-10-07 MED ORDER — ARIPIPRAZOLE 10 MG PO TABS: 10.0000 mg | ORAL_TABLET | Freq: Every day | ORAL | 0 refills | Status: DC

## 2020-10-07 NOTE — Progress Notes (Signed)
Recreation Therapy Notes  INPATIENT RECREATION TR PLAN  Patient Details Name: [name] MRN: [id number] DOB: 08-08-1970 Today's Date: 10/07/2020  Rec Therapy Plan Is patient appropriate for Therapeutic Recreation?: Yes Treatment times per week: at least 3 Estimated Length of Stay: 5-7 days TR Treatment/Interventions: Group participation (Comment)  Discharge Criteria Pt will be discharged from therapy if:: Discharged Treatment plan/goals/alternatives discussed and agreed upon by:: Patient/family  Discharge Summary Short term goals set: Patient will identify benefit of making healthy decisions post d/c within 5 recreation therapy group sessions Short term goals met: Complete Progress toward goals comments: Groups attended Which groups?: Leisure education,Self-esteem,Social skills Reason goals not met: N/A Therapeutic equipment acquired: N/A Reason patient discharged from therapy: Discharge from hospital Pt/family agrees with progress & goals achieved: Yes Date patient discharged from therapy: 10/07/20   Theodis Kinsel 10/07/2020, 2:12 PM

## 2020-10-07 NOTE — Progress Notes (Signed)
Pt was educated on prescriptions and follow up care. Pt questions were answered and pt verbalized understanding and did not voice any concerns. [name] belongings were returned. Pt was safely discharged to the [facility] by [facility].

## 2020-10-07 NOTE — Progress Notes (Signed)
Recreation Therapy Notes   Date: 10/07/2020  Time: 10:00 am   Location: Courtyard   Behavioral response: Appropriate  Intervention Topic: Leisure   Discussion/Intervention:  Group content today was focused on leisure. The group defined what leisure is and some positive leisure activities they participate in. Individuals identified the difference between good and bad leisure. Participants expressed how they feel after participating in the leisure of their choice. The group discussed how they go about picking a leisure activity and if others are involved in their leisure activities. The patient stated how many leisure activities they have to choose from and reasons why it is important to have leisure time. Individuals participated in the intervention "Exploration of Leisure" where they had a chance to identify new leisure activities as well as benefits of leisure. Clinical Observations/Feedback: Patient came to group and was focused on what peers and staff had to say. She identified music and being social with others as leisure activities she participates in. Individual was social with staff and peers while participating in the intervention. Adely Facer LRT/CTRS         Longino Trefz 10/07/2020 12:10 PM

## 2020-10-07 NOTE — Progress Notes (Signed)
  [facility] Adult Case Management Discharge Plan :  Will you be returning to the same living situation after discharge:  Yes,  Patient to return to place of residence.  At discharge, do you have transportation home?: Yes,  Family to assist with transportation.  Do you have the ability to pay for your medications: Yes,  [facility]  Release of information consent forms completed and in the chart;  Patient's signature needed at discharge.  Patient to Follow up at:  Follow-up Information    [facility] Follow up.   Specialty: Behavioral Health Why: Please attend scheduled appointment TELEPHONE on 14 Oct 2020 @ 1100. This appointment is by telephone, DO NOT COME TO THE OFFICE.  Contact information: [name] [facility] [place] [facility]. Call.   Why: Call to get information for starting services.  Contact information: [phone]              Next level of care provider has access to [place] and Suicide Prevention discussed: Yes,  SPE completed with patient and mother.      Has patient been referred to the Quitline?: Patient refused referral  Patient has been referred for addiction treatment: N/A  Durenda Hurt, [place] 10/07/2020, 9:23 AM

## 2020-10-07 NOTE — Progress Notes (Signed)
Patient denies SI, HI, and AVH. Patient asks about discharge and states she just wants to go home to get a good night's sleep. She denies pain and medication side effects. Patient is compliant with medication. Support and encouragement provided. Patient remains safe on the unit at this time and q15 min safety checks have been maintained.

## 2020-10-07 NOTE — Discharge Summary (Signed)
Physician Discharge Summary Note  Patient:  [name] is an 50 y.o., female MRN:  [id number] DOB:  January 16, 1971 Patient phone:  [phone] (home)  Patient address:   [place] [address],  Total Time spent with patient: 45 minutes  Date of Admission:  10/03/2020 Date of Discharge: Oct 07, 2020  Reason for Admission: Patient was admitted because of manic symptoms hyperactive behavior disorganized behavior some hostility  Principal Problem: Bipolar affective disorder, current episode manic [facility]) Discharge Diagnoses: Principal Problem:   Bipolar affective disorder, current episode manic ([place])   Past Psychiatric History: Past history of bipolar disorder with manic episodes as well as depression  Past Medical History:  Past Medical History:  Diagnosis Date  . Akathisia 12/17/2014  . Bipolar affective ([place])   . Restless leg syndrome   . Schizoaffective disorder ([place])   . Schizoaffective disorder [facility])     Past Surgical History:  Procedure Laterality Date  . CESAREAN SECTION    . LAPAROSCOPIC APPENDECTOMY N/A 07/02/2020   Procedure: APPENDECTOMY LAPAROSCOPIC;  Surgeon: Fredirick Maudlin, MD;  Location: [facility] ORS;  Service: General;  Laterality: N/A;   Family History:  Family History  Problem Relation Age of Onset  . Drug abuse Maternal Aunt    Family Psychiatric  History: Positive for bipolar disorder Social History:  Social History   Substance and Sexual Activity  Alcohol Use No     Social History   Substance and Sexual Activity  Drug Use No    Social History   Socioeconomic History  . Marital status: Married    Spouse name: [name]  . Number of children: 2  . Years of education: 45  . Highest education level: Some college, no degree  Occupational History  . Not on file  Tobacco Use  . Smoking status: Current Every Day Smoker    Packs/day: 0.50    Years: 10.00    Pack years: 5.00    Types: Cigarettes  . Smokeless tobacco: Never Used   Vaping Use  . Vaping Use: Former  Substance and Sexual Activity  . Alcohol use: No  . Drug use: No  . Sexual activity: Yes  Other Topics Concern  . 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  Initially resistant to treatment and irritable she did start taking her Abilify and tolerated it well.  Rapidly improved with less hostility and more appropriate openness to treatment.  She actually asked to start on long-acting injectable shots and received 300 mg Abilify long-acting shot while continuing on her 10 mg oral dose.  At the time of discharge patient is taking care of herself well no evidence of psychosis.  Still a little over energetic but no dangerous behavior.  Expresses good insight and agrees to outpatient treatment  Physical Findings: AIMS: Facial and Oral Movements Muscles of Facial Expression: None, normal Lips and Perioral Area: None, normal Jaw: None, normal Tongue: None, normal,Extremity Movements Upper (arms, wrists, hands, fingers): None, normal Lower (legs, knees, ankles, toes): None, normal, Trunk Movements Neck,  shoulders, hips: None, normal, Overall Severity Severity of abnormal movements (highest score from questions above): None, normal Incapacitation due to abnormal movements: None, normal Patient's awareness of abnormal movements (rate only patient's report): No Awareness, Dental Status Current problems with teeth and/or dentures?: No Does patient usually wear dentures?: No  CIWA:    COWS:     Musculoskeletal: Strength & Muscle Tone: within normal limits Gait & Station:  normal Patient leans: N/A                Psychiatric Specialty Exam:  Presentation  General Appearance: Appropriate for Environment; Casual  Eye Contact:Good  Speech:Clear and Coherent  Speech Volume:Increased  Handedness:Right   Mood and Affect  Mood:Anxious  Affect:Congruent   Thought Process  Thought Processes:Coherent; Goal Directed  Descriptions of Associations:Intact  Orientation:Full (Time, Place and Person)  Thought Content:Logical  History of Schizophrenia/Schizoaffective disorder:No  Duration of Psychotic Symptoms:Less than six months  Hallucinations:No data recorded Ideas of Reference:Paranoia  Suicidal Thoughts:No data recorded Homicidal Thoughts:No data recorded  Sensorium  Memory:Immediate Fair; Recent Fair; Remote Fair  Judgment:Fair  Insight:Fair   Executive Functions  Concentration:Good  Attention Span:Good  [place]  Language:Good   Psychomotor Activity  Psychomotor Activity:No data recorded  Assets  Assets:Communication Skills; Desire for Improvement; Financial Resources/Insurance; Housing; Intimacy; Leisure Time; Physical Health; Resilience; Social Support; Talents/Skills; Transportation   Sleep  Sleep:No data recorded   Physical Exam: Physical Exam Vitals and nursing note reviewed.  Constitutional:      Appearance: Normal appearance.  HENT:     Head: Normocephalic and atraumatic.     Mouth/Throat:     Pharynx: Oropharynx is clear.  Eyes:     Pupils: Pupils are equal, round, and reactive to light.  Cardiovascular:     Rate and Rhythm: Normal rate and regular rhythm.  Pulmonary:     Effort: Pulmonary effort is normal.     Breath sounds: Normal breath sounds.  Abdominal:     General: Abdomen is flat.     Palpations: Abdomen is soft.  Musculoskeletal:        General: Normal range of motion.  Skin:    General: Skin is warm and dry.  Neurological:     General: No  focal deficit present.     Mental Status: She is alert. Mental status is at baseline.  Psychiatric:        Mood and Affect: Mood normal.        Thought Content: Thought content normal.    Review of Systems  Constitutional: Negative.   HENT: Negative.   Eyes: Negative.   Respiratory: Negative.   Cardiovascular: Negative.   Gastrointestinal: Negative.   Musculoskeletal: Negative.   Skin: Negative.   Neurological: Negative.   Psychiatric/Behavioral: Negative.    Blood pressure 127/66, pulse 89, temperature 98 F (36.7 C), temperature source Oral, resp. rate 18, height 5\' 2"  (1.575 m), weight 93.4 kg, SpO2 98 %. Body mass index is 37.66 kg/m.      Has this patient used any form of tobacco in the last 30 days? (Cigarettes, Smokeless Tobacco, Cigars, and/or Pipes) Yes, No  Blood Alcohol level:  Lab Results  Component Value Date   ETH <10 13/24/4010    Metabolic Disorder Labs:  Lab Results  Component Value Date   HGBA1C 6.2 03/27/2020   No results found for: PROLACTIN Lab Results  Component Value Date   CHOL 188 03/27/2020   TRIG 170.0 (H) 03/27/2020   HDL 49.00 03/27/2020  CHOLHDL 4 03/27/2020   VLDL 34.0 03/27/2020   LDLCALC 105 (H) 03/27/2020   LDLCALC 99 12/18/2014    See Psychiatric Specialty Exam and Suicide Risk Assessment completed by Attending Physician prior to discharge.  Discharge destination:  Home  Is patient on multiple antipsychotic therapies at discharge:  No   Has Patient had three or more failed trials of antipsychotic monotherapy by history:  No  Recommended Plan for Multiple Antipsychotic Therapies: NA  Discharge Instructions    Diet - low sodium heart healthy   Complete by: As directed    Increase activity slowly   Complete by: As directed      Allergies as of 10/07/2020   No Known Allergies     Medication List    STOP taking these medications   traZODone 100 MG tablet Commonly known as: DESYREL     TAKE these medications      Indication  ARIPiprazole 10 MG tablet Commonly known as: Abilify Take 1 tablet (10 mg total) by mouth daily. What changed: Another medication with the same name was added. Make sure you understand how and when to take each.  Indication: Manic Phase of Manic-Depression, Bipolar disorder   Abilify Maintena 300 MG Srer injection Generic drug: ARIPiprazole ER Inject 1.5 mLs (300 mg total) into the muscle every 28 (twenty-eight) days. Start taking on: Nov 03, 2020 What changed: You were already taking a medication with the same name, and this prescription was added. Make sure you understand how and when to take each.  Indication: Manic-Depression       Follow-up Information    [facility] Follow up.   Specialty: Behavioral Health Why: Please attend scheduled appointment TELEPHONE on 14 Oct 2020 @ 1100. This appointment is by telephone, DO NOT COME TO THE OFFICE.  Contact information: [place] [facility] [place] [facility]. Call.   Why: Call to get information for starting services.  Contact information: [phone]              Follow-up recommendations:  Activity:  Activity as tolerated Diet:  Regular Other:  Follow-up outpatient treatment  Comments: Prescriptions provided at discharge  Signed: Alethia Berthold, MD 10/07/2020, 8:19 PM

## 2020-10-07 NOTE — Plan of Care (Signed)
  Problem: Decision Making Goal: STG - Patient will identify benefit of making healthy decisions post d/c within 5 recreation therapy group sessions Description: STG - Patient will identify benefit of making healthy decisions post d/c within 5 recreation therapy group sessions Outcome: Completed/Met   

## 2020-10-07 NOTE — BHH Suicide Risk Assessment (Signed)
[facility] Discharge Suicide Risk Assessment   Principal Problem: Bipolar affective disorder, current episode manic [facility]) Discharge Diagnoses: Principal Problem:   Bipolar affective disorder, current episode manic ([place])   Total Time spent with patient: 45 minutes  Musculoskeletal: Strength & Muscle Tone: within normal limits Gait & Station: normal Patient leans: N/A  Psychiatric Specialty Exam: Review of Systems  Constitutional: Negative.   HENT: Negative.   Eyes: Negative.   Respiratory: Negative.   Cardiovascular: Negative.   Gastrointestinal: Negative.   Musculoskeletal: Negative.   Skin: Negative.   Neurological: Negative.   Psychiatric/Behavioral: Negative.     Blood pressure 127/66, pulse 89, temperature 98 F (36.7 C), temperature source Oral, resp. rate 18, height 5\' 2"  (1.575 m), weight 93.4 kg, SpO2 98 %.Body mass index is 37.66 kg/m.  General Appearance: Casual  Eye Contact::  Good  Speech:  Clear and [id number]  Volume:  Normal  Mood:  Euthymic  Affect:  Congruent  Thought Process:  Goal Directed  Orientation:  Full (Time, Place, and Person)  Thought Content:  Logical  Suicidal Thoughts:  No  Homicidal Thoughts:  No  Memory:  Immediate;   Fair Recent;   Fair Remote;   Fair  Judgement:  Fair  Insight:  Fair  Psychomotor Activity:  Normal  Concentration:  Fair  Recall:  [employer] of Knowledge:Fair  Language: Fair  Akathisia:  No  Handed:  Right  AIMS (if indicated):     Assets:  Desire for Improvement  Sleep:  Number of Hours: 2.75  Cognition: WNL  ADL's:  Intact   Mental Status Per Nursing Assessment::   On Admission:  Suicidal ideation indicated by others  Demographic Factors:  Caucasian  Loss Factors: Financial problems/change in socioeconomic status  Historical Factors: Impulsivity  Risk Reduction Factors:   Employed, Living with another person, especially a relative, Positive social support and Positive therapeutic  relationship  Continued Clinical Symptoms:  Bipolar Disorder:   Mixed State  Cognitive Features That Contribute To Risk:  None    Suicide Risk:  Minimal: No identifiable suicidal ideation.  Patients presenting with no risk factors but with morbid ruminations; may be classified as minimal risk based on the severity of the depressive symptoms   Follow-up [facility] Follow up.   Specialty: Behavioral Health Why: Please attend scheduled appointment TELEPHONE on 14 Oct 2020 @ 1100. This appointment is by telephone, DO NOT COME TO THE OFFICE.  Contact information: [place] [facility] [place] [facility]. Call.   Why: Call to get information for starting services.  Contact information: [phone]              Plan Of Care/Follow-up recommendations:  Activity:  Activity as tolerated Diet:  Regular diet Other:  Follow-up with outpatient treatment with primary doctor  Alethia Berthold, MD 10/07/2020, 11:06 AM

## 2020-10-09 ENCOUNTER — Emergency Department
Admission: EM | Admit: 2020-10-09 | Discharge: 2020-10-09 | Disposition: A | Discharge status: Other Acute Inpatient Hospital | Payer: 59 | Attending: Emergency Medicine | Admitting: Emergency Medicine

## 2020-10-09 ENCOUNTER — Encounter: Payer: Self-pay | Admitting: Psychiatry

## 2020-10-09 ENCOUNTER — Encounter: Payer: Self-pay | Admitting: Emergency Medicine

## 2020-10-09 ENCOUNTER — Other Ambulatory Visit: Payer: Self-pay

## 2020-10-09 ENCOUNTER — Inpatient Hospital Stay
Admission: AD | Admit: 2020-10-09 | Discharge: 2020-10-12 | DRG: 885 | Disposition: A | Discharge status: Home / Self Care | Payer: 59 | Source: Intra-hospital | Attending: Behavioral Health | Admitting: Behavioral Health

## 2020-10-09 DIAGNOSIS — R451 Restlessness and agitation: Secondary | ICD-10-CM | POA: Diagnosis not present

## 2020-10-09 DIAGNOSIS — G2581 Restless legs syndrome: Secondary | ICD-10-CM | POA: Diagnosis present

## 2020-10-09 DIAGNOSIS — F17210 Nicotine dependence, cigarettes, uncomplicated: Secondary | ICD-10-CM | POA: Diagnosis present

## 2020-10-09 DIAGNOSIS — R443 Hallucinations, unspecified: Secondary | ICD-10-CM | POA: Diagnosis not present

## 2020-10-09 DIAGNOSIS — F3110 Bipolar disorder, current episode manic without psychotic features, unspecified: Principal | ICD-10-CM | POA: Diagnosis present

## 2020-10-09 DIAGNOSIS — F3113 Bipolar disorder, current episode manic without psychotic features, severe: Secondary | ICD-10-CM | POA: Diagnosis not present

## 2020-10-09 DIAGNOSIS — Z79899 Other long term (current) drug therapy: Secondary | ICD-10-CM | POA: Diagnosis not present

## 2020-10-09 DIAGNOSIS — Z046 Encounter for general psychiatric examination, requested by authority: Secondary | ICD-10-CM | POA: Diagnosis not present

## 2020-10-09 LAB — COMPREHENSIVE METABOLIC PANEL
ALT: 28 U/L (ref 0–44)
AST: 23 U/L (ref 15–41)
Albumin: 4.2 g/dL (ref 3.5–5.0)
Alkaline Phosphatase: 77 U/L (ref 38–126)
Anion gap: 10 (ref 5–15)
BUN: 18 mg/dL (ref 6–20)
CO2: 21 mmol/L — ABNORMAL LOW (ref 22–32)
Calcium: 9.1 mg/dL (ref 8.9–10.3)
Chloride: 109 mmol/L (ref 98–111)
Creatinine, Ser: 0.73 mg/dL (ref 0.44–1.00)
GFR, Estimated: >60 mL/min (ref >60)
Glucose, Bld: 142 mg/dL — ABNORMAL HIGH (ref 70–99)
Potassium: 3.8 mmol/L (ref 3.5–5.1)
Sodium: 140 mmol/L (ref 135–145)
Total Bilirubin: 0.5 mg/dL (ref 0.3–1.2)
Total Protein: 7.6 g/dL (ref 6.5–8.1)

## 2020-10-09 LAB — URINE DRUG SCREEN, QUALITATIVE (ARMC ONLY)
Amphetamines, Ur Screen: NOT DETECTED
Barbiturates, Ur Screen: NOT DETECTED
Benzodiazepine, Ur Scrn: NOT DETECTED
Cannabinoid 50 Ng, Ur ~~LOC~~: NOT DETECTED
Cocaine Metabolite,Ur ~~LOC~~: NOT DETECTED
MDMA (Ecstasy)Ur Screen: NOT DETECTED
Methadone Scn, Ur: NOT DETECTED
Opiate, Ur Screen: NOT DETECTED
Phencyclidine (PCP) Ur S: NOT DETECTED
Tricyclic, Ur Screen: NOT DETECTED

## 2020-10-09 LAB — CBC
HCT: 40.6 % (ref 36.0–46.0)
Hemoglobin: 13.3 g/dL (ref 12.0–15.0)
MCH: 28.7 pg (ref 26.0–34.0)
MCHC: 32.8 g/dL (ref 30.0–36.0)
MCV: 87.5 fL (ref 80.0–100.0)
Platelets: 370 10*3/uL (ref 150–400)
RBC: 4.64 MIL/uL (ref 3.87–5.11)
RDW: 14.3 % (ref 11.5–15.5)
WBC: 8.2 10*3/uL (ref 4.0–10.5)
nRBC: 0 % (ref 0.0–0.2)

## 2020-10-09 LAB — ACETAMINOPHEN LEVEL: Acetaminophen (Tylenol), Serum: <10 ug/mL — ABNORMAL LOW (ref 10–30)

## 2020-10-09 LAB — ETHANOL: Alcohol, Ethyl (B): <10 mg/dL (ref <10)

## 2020-10-09 LAB — SALICYLATE LEVEL: Salicylate Lvl: <7 mg/dL — ABNORMAL LOW (ref 7.0–30.0)

## 2020-10-09 MED ORDER — MAGNESIUM HYDROXIDE 400 MG/5ML PO SUSP
30.0000 mL | Freq: Every day | ORAL | Status: DC | PRN
Start: 2020-10-09 — End: 2020-10-12

## 2020-10-09 MED ORDER — ALUM & MAG HYDROXIDE-SIMETH 200-200-20 MG/5ML PO SUSP
30.0000 mL | ORAL | Status: DC | PRN
Start: 2020-10-09 — End: 2020-10-12

## 2020-10-09 MED ORDER — ACETAMINOPHEN 325 MG PO TABS: 650.0000 mg | ORAL_TABLET | Freq: Four times a day (QID) | ORAL | Status: DC | PRN

## 2020-10-09 MED ORDER — ARIPIPRAZOLE 10 MG PO TABS
20.0000 mg | ORAL_TABLET | Freq: Every day | ORAL | Status: DC
  Administered 2020-10-10 – 2020-10-12 (×3): 20 mg via ORAL
  Filled 2020-10-09 (×3): qty 2

## 2020-10-09 MED ORDER — DIVALPROEX SODIUM 500 MG PO DR TAB
500.0000 mg | DELAYED_RELEASE_TABLET | Freq: Two times a day (BID) | ORAL | Status: DC
  Administered 2020-10-09 – 2020-10-12 (×5): 500 mg via ORAL
  Filled 2020-10-09 (×5): qty 1

## 2020-10-09 MED ORDER — DIVALPROEX SODIUM 500 MG PO DR TAB
500.0000 mg | DELAYED_RELEASE_TABLET | Freq: Two times a day (BID) | ORAL | Status: DC
  Filled 2020-10-09: qty 1

## 2020-10-09 MED ORDER — ARIPIPRAZOLE 10 MG PO TABS
20.0000 mg | ORAL_TABLET | Freq: Every day | ORAL | Status: DC
  Filled 2020-10-09: qty 2

## 2020-10-09 NOTE — ED Triage Notes (Signed)
Pt comes into the ED via p[olice for IVC.  Pt was just [facility] from [facility] on Wednesday. Pt been diagnosed with schizo- affective disorder.  Unknown if the patient is taking all her medications, and per family the patient is having some hallucinations.  Pt calm and cooperative at this time. Pt denies any SI or HI.
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
## 2020-10-09 NOTE — ED Notes (Signed)
Personal Belongings:  Pink shirt Tan bra Green/black flip flops Black shorts Yellow ring Blue purse Barista White underwear Grey pants Blue and white gown
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
## 2020-10-09 NOTE — BH Assessment (Addendum)
Patient is to be admitted to [facility] by Dr. Weber Cooks.  Attending Physician will be Dr. Domingo Cocking.   Patient has been assigned to room 319, by [place].   Intake Paper Work has been signed and placed on patient chart.  ER staff is aware of the admission:  Mel[place], ER Secretary    Dr. Joni Fears, ER MD   Delilah Shan, Patient's Nurse   Helene Kelp, Patient Access.  Pt can be transported anytime after 8PM on 10/09/20.
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
## 2020-10-09 NOTE — Plan of Care (Signed)
  Problem: Education: Goal: Knowledge of [place] Education information/materials will improve Outcome: Progressing Goal: Emotional status will improve Outcome: Progressing Goal: Mental status will improve Outcome: Progressing Goal: Verbalization of understanding the information provided will improve Outcome: Progressing   Problem: Activity: Goal: Interest or engagement in activities will improve Outcome: Progressing Goal: Sleeping patterns will improve Outcome: Progressing   Problem: Coping: Goal: Ability to verbalize frustrations and anger appropriately will improve Outcome: Progressing Goal: Ability to demonstrate self-control will improve Outcome: Progressing   Problem: Health Behavior/Discharge Planning: Goal: Identification of resources available to assist in meeting health care needs will improve Outcome: Progressing Goal: Compliance with treatment plan for underlying cause of condition will improve Outcome: Progressing   Problem: Physical Regulation: Goal: Ability to maintain clinical measurements within normal limits will improve Outcome: Progressing   Problem: Safety: Goal: Periods of time without injury will increase Outcome: Progressing   Problem: Education: Goal: Ability to state activities that reduce stress will improve Outcome: Progressing   Problem: Coping: Goal: Ability to identify and develop effective coping behavior will improve Outcome: Progressing   Problem: Self-Concept: Goal: Ability to identify factors that promote anxiety will improve Outcome: Progressing Goal: Level of anxiety will decrease Outcome: Progressing Goal: Ability to modify response to factors that promote anxiety will improve Outcome: Progressing   

## 2020-10-09 NOTE — ED Notes (Signed)
IVC pending inpatient per clapacs

## 2020-10-09 NOTE — Tx Team (Signed)
Initial Treatment Plan 10/09/2020 10:44 PM [name] HQR:[id number]    PATIENT STRESSORS: Marital or family conflict   PATIENT STRENGTHS: Ability for insight Active sense of humor Average or above average intelligence Capable of independent living General fund of knowledge Motivation for treatment/growth Physical Health Supportive family/friends Work skills   PATIENT IDENTIFIED PROBLEMS:     Bipolar disorder                 DISCHARGE CRITERIA:  Ability to meet basic life and health needs Adequate post-discharge living arrangements Improved stabilization in mood, thinking, and/or behavior Medical problems require only outpatient monitoring Motivation to continue treatment in a less acute level of care Need for constant or close observation no longer present Reduction of life-threatening or endangering symptoms to within safe limits Safe-care adequate arrangements made Verbal commitment to aftercare and medication compliance  PRELIMINARY DISCHARGE PLAN: Outpatient therapy Return to previous living arrangement  PATIENT/FAMILY INVOLVEMENT: This treatment plan has been presented to and reviewed with the patient, [name], and/or family member.  The patient and family have been given the opportunity to ask questions and make suggestions.  Libby Maw, RN 10/09/2020, 10:44 PM

## 2020-10-10 DIAGNOSIS — F3113 Bipolar disorder, current episode manic without psychotic features, severe: Secondary | ICD-10-CM

## 2020-10-10 NOTE — H&P (Addendum)
Psychiatric Admission Assessment Adult  Patient Identification: [name] MRN:  [id number] Date of Evaluation:  10/10/2020 Chief Complaint:  Bipolar affective disorder, current episode manic ([place]) [F31.10] Principal Diagnosis: <principal problem not specified> Diagnosis:  Active Problems:   Bipolar affective disorder, current episode manic ([place])  [name] is a 50yo F with psych history of bipolar disorder, who was admitted to [facility] unit due to acute mania.  History of Present Illness: Per chart, patient was discharged from [facility] unit on 5/4. Last hospital stay was due to acute mania, patient was restarted on oral Abilify, tolerated it well, rapidly improved. She received 300 mg Abilify long-acting shot last week (next is due 05/31) while continuing 10 mg oral dose. At the time of discharge patient is taking care of herself well no evidence of psychosis, no dangerous behavior.  Patient was brought back to ED yesterday (5/6) on involuntary papers filed by her daughter.  Per Psych Consult note by Dr. Weber Cooks: "Multiple things are alleged in the commitment petition but essentially it is that the patient has continued to display manic symptoms with agitation hospital behavior disorganized behavior and poor judgment." Dr. Weber Cooks increased Abilify to 10mg  PO daily and put order to start Depakote, although patient is refusing to take the last one per RN report.  Patient seen in [facility] unit today. Patient reports feeling upset due to seeing some concerning content in her husbands facebook messenger. She called her husband`s brother after 11pm, then called her daughter to discuss her concerns, although she was brought to ER as a result. She reports she has been compliant with oral Abilify and saw her outpatient psychiatrist. She does not like how Depakote makes her feel sedated, although she is agreeable to continue the medication after we discussed benefits of it.  She denies suicidal or homicidal thoughts,  Denies auditory or visual hallucinations. She reports good sleep and appetite. Denies any physical complaints. She denies substance use.  Associated Signs/Symptoms: Mania: increased energy; racing thoughts; flight of ideas; distractibility;  talkativeness; increased goal directed activity.  Past Psychiatric History:  Previous psych diagnosis: bipolar disorder Patient had previous [place] admissions. Last discharge from [facility] - 10/07/20 on Abilify [place] 300mg  IM Q28d (next due 11/03/20) and Abilify 10mg  PO daily. Outpatient psychiatrist: Dr. Adele Schilder in [place]. Past psychotropic medications: Zyprexa, Lamictal, Clonazepam, Abilify.   Social History: Married Has two children Works No childhood trauma hx Denies substance use  Total Time spent with patient: 30 minutes  Alcohol Screening: 1. How often do you have a drink containing alcohol?: Never 2. How many drinks containing alcohol do you have on a typical day when you are drinking?: 1 or 2 3. How often do you have six or more drinks on one occasion?: Never AUDIT-C Score: 0 4. How often during the last year have you found that you were not able to stop drinking once you had started?: Never 5. How often during the last year have you failed to do what was normally expected from you because of drinking?: Never 6. How often during the last year have you needed a first drink in the morning to get yourself going after a heavy drinking session?: Never 7. How often during the last year have you had a feeling of guilt of remorse after drinking?: Never 8. How often during the last year have you been unable to remember what happened the night before because you had been drinking?: Never 9. Have you or someone else been injured as a  result of your drinking?: No 10. Has a relative or friend or a doctor or another health worker been concerned about your drinking or suggested you cut down?: No Alcohol Use Disorder Identification Test Final Score  (AUDIT): 0  Past Medical History:  Past Medical History:  Diagnosis Date  . Akathisia 12/17/2014  . Bipolar affective ([place])   . Restless leg syndrome   . Schizoaffective disorder ([place])   . Schizoaffective disorder [facility])     Past Surgical History:  Procedure Laterality Date  . CESAREAN SECTION    . LAPAROSCOPIC APPENDECTOMY N/A 07/02/2020   Procedure: APPENDECTOMY LAPAROSCOPIC;  Surgeon: Fredirick Maudlin, MD;  Location: [facility] ORS;  Service: General;  Laterality: N/A;   Family History:  Family History  Problem Relation Age of Onset  . Drug abuse Maternal Aunt    Family Psychiatric  History: unknown Tobacco Screening: Have you used any form of tobacco in the last 30 days? (Cigarettes, Smokeless Tobacco, Cigars, and/or Pipes): Yes Tobacco use, Select all that apply: 4 or less cigarettes per day Are you interested in Tobacco Cessation Medications?: Yes, will notify MD for an order Counseled patient on smoking cessation including recognizing danger situations, developing coping skills and basic information about quitting provided: Yes Social History:  Social History   Substance and Sexual Activity  Alcohol Use No     Social History   Substance and Sexual Activity  Drug Use No        Allergies:  No Known Allergies Lab Results:  Results for orders placed or performed during the hospital encounter of 10/09/20 (from the past 48 hour(s))  Comprehensive metabolic panel     Status: Abnormal   Collection Time: 10/09/20  1:12 PM  Result Value Ref Range   Sodium 140 135 - 145 mmol/L   Potassium 3.8 3.5 - 5.1 mmol/L   Chloride 109 98 - 111 mmol/L   CO2 21 (L) 22 - 32 mmol/L   Glucose, Bld 142 (H) 70 - 99 mg/dL    Comment: Glucose reference range applies only to samples taken after fasting for at least 8 hours.   BUN 18 6 - 20 mg/dL   Creatinine, Ser 0.73 0.44 - 1.00 mg/dL   Calcium 9.1 8.9 - 10.3 mg/dL   Total Protein 7.6 6.5 - 8.1 g/dL   Albumin 4.2 3.5 - 5.0 g/dL   AST 23 15 -  41 U/L   ALT 28 0 - 44 U/L   Alkaline Phosphatase 77 38 - 126 U/L   Total Bilirubin 0.5 0.3 - 1.2 mg/dL   GFR, Estimated >60 >60 mL/min    Comment: (NOTE) Calculated using the CKD-EPI Creatinine Equation (2021)    Anion gap 10 5 - 15    Comment: Performed at [facility], [address] [place], [place] [address]  Ethanol     Status: None   Collection Time: 10/09/20  1:12 PM  Result Value Ref Range   Alcohol, Ethyl (B) <10 <10 mg/dL    Comment: (NOTE) Lowest detectable limit for serum alcohol is 10 mg/dL.  For medical purposes only. Performed at [facility], [address] [place], [place] [address]   Salicylate level     Status: Abnormal   Collection Time: 10/09/20  1:12 PM  Result Value Ref Range   Salicylate Lvl <2.1 (L) 7.0 - 30.0 mg/dL    Comment: Performed at [facility], [address], [place], [place] [address]  Acetaminophen level     Status: Abnormal  Collection Time: 10/09/20  1:12 PM  Result Value Ref Range   Acetaminophen (Tylenol), Serum <10 (L) 10 - 30 ug/mL    Comment: (NOTE) Therapeutic concentrations vary significantly. A range of 10-30 ug/mL  may be an effective concentration for many patients. However, some  are best treated at concentrations outside of this range. Acetaminophen concentrations >150 ug/mL at 4 hours after ingestion  and >50 ug/mL at 12 hours after ingestion are often associated with  toxic reactions.  Performed at [facility], [address] [place], [place] [address]   cbc     Status: None   Collection Time: 10/09/20  1:12 PM  Result Value Ref Range   WBC 8.2 4.0 - 10.5 K/uL   RBC 4.64 3.87 - 5.11 MIL/uL   Hemoglobin 13.3 12.0 - 15.0 g/dL   HCT 40.6 36.0 - 46.0 %   MCV 87.5 80.0 - 100.0 fL   MCH 28.7 26.0 - 34.0 pg   MCHC 32.8 30.0 - 36.0 g/dL   RDW 14.3 11.5 - 15.5 %   Platelets 370 150 - 400 K/uL   nRBC 0.0 0.0 - 0.2 %    Comment: Performed at [facility], [address] [place], [place] [address]  Urine Drug Screen, Qualitative     Status: None   Collection Time: 10/09/20  1:12 PM  Result Value Ref Range   Tricyclic, Ur Screen NONE DETECTED NONE DETECTED   Amphetamines, Ur Screen NONE DETECTED NONE DETECTED   MDMA (Ecstasy)Ur Screen NONE DETECTED NONE DETECTED   Cocaine Metabolite,Ur [place] NONE DETECTED NONE DETECTED   Opiate, Ur Screen NONE DETECTED NONE DETECTED   Phencyclidine (PCP) Ur S NONE DETECTED NONE DETECTED   Cannabinoid 50 Ng, Ur [place] NONE DETECTED NONE DETECTED   Barbiturates, Ur Screen NONE DETECTED NONE DETECTED   Benzodiazepine, Ur Scrn NONE DETECTED NONE DETECTED   Methadone Scn, Ur NONE DETECTED NONE DETECTED    Comment: (NOTE) Tricyclics + metabolites, urine    Cutoff 1000 ng/mL Amphetamines + metabolites, urine  Cutoff 1000 ng/mL MDMA (Ecstasy), urine              Cutoff 500 ng/mL Cocaine Metabolite, urine          Cutoff 300 ng/mL Opiate + metabolites, urine        Cutoff 300 ng/mL Phencyclidine (PCP), urine         Cutoff 25 ng/mL Cannabinoid, urine                 Cutoff 50 ng/mL Barbiturates + metabolites, urine  Cutoff 200 ng/mL Benzodiazepine, urine              Cutoff 200 ng/mL Methadone, urine                   Cutoff 300 ng/mL  The urine drug screen provides only a preliminary, unconfirmed analytical test result and should not be used for non-medical purposes. Clinical consideration and professional judgment should be applied to any positive drug screen result due to possible interfering substances. A more specific alternate chemical method must be used in order to obtain a confirmed analytical result. Gas chromatography / mass spectrometry (GC/MS) is the preferred confirm atory method. Performed at [facility], [address] [place], [place] [address]     Blood Alcohol level:  Lab Results  Component Value Date   [facility] <10 10/09/2020   ETH <10 [id number]    Metabolic Disorder Labs:  Lab  Results  Component Value Date   HGBA1C 6.2 03/27/2020   No results found for: PROLACTIN Lab Results  Component Value Date   CHOL 188 03/27/2020   TRIG 170.0 (H) 03/27/2020   HDL 49.00 03/27/2020   CHOLHDL 4 03/27/2020   VLDL 34.0 03/27/2020   LDLCALC 105 (H) 03/27/2020   LDLCALC 99 12/18/2014    Current Medications: Current Facility-Administered Medications  Medication Dose Route Frequency Provider Last Rate Last Admin  . acetaminophen (TYLENOL) tablet 650 mg  650 mg Oral Q6H PRN Clapacs, John T, MD      . alum & mag hydroxide-simeth (MAALOX/MYLANTA) 200-200-20 MG/5ML suspension 30 mL  30 mL Oral Q4H PRN Clapacs, John T, MD      . ARIPiprazole (ABILIFY) tablet 20 mg  20 mg Oral Daily Clapacs, Madie Reno, MD   20 mg at 10/10/20 0809  . divalproex (DEPAKOTE) DR tablet 500 mg  500 mg Oral Q12H Clapacs, Madie Reno, MD   500 mg at 10/09/20 2201  . magnesium hydroxide (MILK OF MAGNESIA) suspension 30 mL  30 mL Oral Daily PRN Clapacs, Madie Reno, MD       PTA Medications: Medications Prior to Admission  Medication Sig Dispense Refill Last Dose  . ARIPiprazole (ABILIFY) 10 MG tablet Take 1 tablet (10 mg total) by mouth daily. 30 tablet 0   . [START ON 11/03/2020] ARIPiprazole ER (ABILIFY MAINTENA) 300 MG SRER injection Inject 1.5 mLs (300 mg total) into the muscle every 28 (twenty-eight) days. 1 each 1     Musculoskeletal: Strength & Muscle Tone: within normal limits Gait & Station: normal Patient leans: N/A  Psychiatric Specialty Exam: Appearance:  CF, appearing stated age, appears well-nourished;  wearing appropriate to the situation casual clothes, with fair grooming and hygiene. Normal level of alertness and appropriate facial expression.  Attitude/Behavior: calm, cooperative, appropriate eye contact.  Motor: WNL; dyskinesias not evident. Gait appears in full range.  Speech: fast  Mood: upset  Affect: appropriate  Thought process: patient appears coherent, somewhat illogical,  tangential.  Thought content: patient denies suicidal thoughts, denies homicidal thoughts. Patient expresses delusions.  Thought perception: patient denies auditory and visual hallucinations. Did not appear internally stimulated.  Cognition: patient is alert and oriented in self, place, date.  Insight: limited, in regards of understanding of presence, nature, cause, and significance of mental or emotional problem.  Judgement: limited, in regards of ability to make good decisions concerning the appropriate thing to do in various situations, including ability to form opinions regarding their mental health condition.   Physical Exam: Physical Exam Vitals and nursing note reviewed.  Constitutional:      Appearance: Normal appearance.  HENT:     Head: Normocephalic and atraumatic.  Eyes:     Extraocular Movements: Extraocular movements intact.     Pupils: Pupils are equal, round, and reactive to light.  Cardiovascular:     Rate and Rhythm: Normal rate and regular rhythm.  Pulmonary:     Effort: Pulmonary effort is normal.     Breath sounds: Normal breath sounds.  Abdominal:     General: Abdomen is flat.     Palpations: Abdomen is soft.  Musculoskeletal:        General: Normal range of motion.     Cervical back: Normal range of motion.  Neurological:     General: No focal deficit present.     Mental Status: She is alert and oriented to person, place, and time.    Review of Systems  Constitutional: Negative for chills and fever.  HENT: Negative for hearing loss.   Eyes: Negative for blurred vision.  Respiratory: Negative for shortness of breath.   Cardiovascular: Negative for chest pain.  Gastrointestinal: Negative for constipation and diarrhea.  Neurological: Negative for focal weakness.   Blood pressure 125/79, pulse 73, temperature 98.5 F (36.9 C), temperature source Oral, resp. rate 17, height 5\' 2"  (1.575 m), weight 93 kg, SpO2 94 %. Body mass index is 37.49  kg/m.  Treatment Plan Summary: Ms. Monasterio is a 50yo F with psych history of bipolar disorder, who was admitted to [facility] unit due to acute mania. Patient seen, chart review. Patient continues to expresses symptoms of  mania; continues to meet criteria for an inpatient psych admission. -inpatient psychiatric admission will be continued. -patient will be integrated in the milieu.   -patient will be encouraged to attend groups.   -Medications: Abilify was increased to 20 mg and Depakote 500mg  PO BID was started to assist with manic symptoms.   -Disposition will be determined after the patient is stabilized. Patient has an outpatient [place] care set up.   Daily contact with patient to assess and evaluate symptoms and progress in treatment and Medication management  Observation Level/Precautions:  15 minute checks  Laboratory:  n/a  Psychotherapy:    Medications:    Consultations:    Discharge Concerns:    Estimated LOS:  Other:     Physician Treatment Plan for Primary Diagnosis: <principal problem not specified> Long Term Goal(s): Improvement in symptoms so as ready for discharge  Short Term Goals: Ability to identify changes in lifestyle to reduce recurrence of condition will improve, Ability to verbalize feelings will improve, Ability to disclose and discuss suicidal ideas, Ability to demonstrate self-control will improve, Ability to identify and develop effective coping behaviors will improve, Ability to maintain clinical measurements within normal limits will improve, Compliance with prescribed medications will improve and Ability to identify triggers associated with substance abuse/mental health issues will improve  Physician Treatment Plan for Secondary Diagnosis: Active Problems:   Bipolar affective disorder, current episode manic ([place])  Long Term Goal(s): Improvement in symptoms so as ready for discharge  Short Term Goals: Ability to identify changes in lifestyle to reduce recurrence of  condition will improve, Ability to verbalize feelings will improve, Ability to disclose and discuss suicidal ideas, Ability to demonstrate self-control will improve, Ability to identify and develop effective coping behaviors will improve, Ability to maintain clinical measurements within normal limits will improve, Compliance with prescribed medications will improve and Ability to identify triggers associated with substance abuse/mental health issues will improve  I certify that inpatient services furnished can reasonably be expected to improve the patient's condition.    Larita Fife, MD 5/7/20229:31 AM

## 2020-10-10 NOTE — Progress Notes (Signed)
Patient up during the night complaining that she felt like her blood pressure was dropping. Blood pressure was 118/66. She says she does not like the way the Depakote makes her feel and that she thinks it is too strong. She does not want to be on anything that requires blood levels to be drawn. Encouraged her to discuss this with physician

## 2020-10-10 NOTE — BHH Group Notes (Signed)
LCSW Group Therapy Note   10/10/2020 11:03 AM   Type of Therapy and Topic:  Group Therapy: Avoiding Self-Sabotaging and Enabling Behaviors  Participation Level:  Active  Description of Group:  In this group, patients will learn how to identify obstacles, self-sabotaging and enabling behaviors, as well as: what are they, why do we do them and what needs these behaviors meet. Discuss unhealthy relationships and how to have positive healthy boundaries with those that sabotage and enable. Explore aspects of self-sabotage and enabling in yourself and how to limit these self-destructive behaviors in everyday life.   Therapeutic Goals: 1.  Patient will identify one obstacle that relates to self-sabotage and enabling behaviors 2.  Patient will identify one personal self-sabotaging or enabling behavior they did prior to admission 3.  Patient will state a plan to change the above identified behavior 4.  Patient will demonstrate ability to communicate their needs through discussion and/or role play.  Summary of Patient Progress: Patient was present for the majority of the group. She sat with staff and discussed her issues around family and work.    Therapeutic Modalities:  Cognitive Behavioral Therapy Person-Centered Therapy Motivational Interviewing   Chalmers Guest. Guerry Bruin, MSW, [place], [place] 10/10/2020 11:03 AM

## 2020-10-10 NOTE — Plan of Care (Signed)
Continues to express concerns about her admission reporting that "it wasn't fair". Reports that her husband and daughter made her come "for no reason". Reports that husband is making up stories to make her look bad. Patient refused her Depakote reporting that it is too strong for her. Denying SI/HI/AVH.

## 2020-10-10 NOTE — BHH Suicide Risk Assessment (Signed)
[facility] Admission Suicide Risk Assessment   Nursing information obtained from:  Patient Demographic factors:  Caucasian Current Mental Status:  NA Loss Factors:  NA Historical Factors:  Impulsivity Risk Reduction Factors:  Sense of responsibility to family,Employed,Positive social support  Total Time spent with patient: 30 minutes Principal Problem: <principal problem not specified> Diagnosis:  Active Problems:   Bipolar affective disorder, current episode manic (HCC)  Subjective Data:  [name] is a 50yo F with psych history of bipolar disorder, who was admitted to [facility] unit due to acute mania. Patient seen, chart review. Patient expresses symptoms of acute mania; she has no insight in her current mental state and expresses poor judjment. Patient continues to meet criteria for an inpatient psych admission.   CLINICAL FACTORS:   Bipolar Disorder:   Mixed State  Assets  Assets:Communication Skills; Desire for Improvement; Financial Resources/Insurance; Housing; Intimacy; Leisure Time; Physical Health; Resilience; Social Support; Talents/Skills; Transportation  COGNITIVE FEATURES THAT CONTRIBUTE TO RISK:  None    SUICIDE RISK:   Mild:  Suicidal ideation of limited frequency, intensity, duration, and specificity.  There are no identifiable plans, no associated intent, mild dysphoria and related symptoms, good self-control (both objective and subjective assessment), few other risk factors, and identifiable protective factors, including available and accessible social support.  PLAN OF CARE:  -inpatient psychiatric admission will be continued. -patient will be integrated in the milieu.   -patient will be encouraged to attend groups.   -Medication management. -Disposition will be determined after the patient is stabilized. Patient has an outpatient [place] care set up.  I certify that inpatient services furnished can reasonably be expected to improve the patient's condition.   Larita Fife,  MD 10/10/2020, 9:43 AM

## 2020-10-10 NOTE — [facility] Counselor (Signed)
Adult Comprehensive Assessment  Patient ID: [name], female   DOB: 03/27/1971, 50 y.o.   MRN: [id number]  Information Source: Information source: Patient  Current Stressors:  Patient states their primary concerns and needs for treatment are:: She states she is back at the hosptial because her daughter IVC'd her with lies. She expresses that she feels her daughter is siding with pt's husband/daughter's father to keep pt in the hospital. Patient states their goals for this hospitilization and ongoing recovery are:: "I'm trying to leave and return to work."  Educational / Learning stressors: Patient denies stressors Employment / Job issues: Patient reports filing for [employer] and waiting to hear back from that but also speaks about potential need to file for short-term disability. Family Relationships: Patient denies stressors Financial / Lack of resources (include bankruptcy): Patient denies stressors Housing / Lack of housing: Patient denies stressors Physical health (include injuries & life threatening diseases): Patient denies stressors Social relationships: Patient is currently having issues with her husband/daughter Substance abuse: Per previous assessment, patient reported taking 2 Xanax's the other night to sleep when she was at her parent's house. Bereavement / Loss: Patient denies stressors.  Living/Environment/Situation:  Living Arrangements: Spouse/significant other Living conditions (as described by patient or guardian): Patient reports she normally lives with her husband but for the past 4 days she has been staying at her mom and dad's home because her husband is afraid she will hurt herself. Who else lives in the home?: Patient's husband How long has patient lived in current situation?: 25 years What is atmosphere in current home: Chaotic,Comfortable  Family History:  Marital status: Married Number of Years Married: 37 What types of issues is patient dealing with in the  relationship?: Recent strained due to symptoms of bipolar Additional relationship information: None reported Are you sexually active?: Yes What is your sexual orientation?: Heterosexual Has your sexual activity been affected by drugs, alcohol, medication, or emotional stress?: None reported Does patient have children?: Yes How many children?: 2 How is patient's relationship with their children?: Patient states that it is good but does not want them involved in this.  Childhood History:  By whom was/is the patient raised?: Both parents Additional childhood history information: None reported Description of patient's relationship with caregiver when they were a child: Patient states it's good Patient's description of current relationship with people who raised him/her: Patient states it's good How were you disciplined when you got in trouble as a child/adolescent?: Patient reports that she had spankings. Does patient have siblings?: No Did patient suffer any verbal/emotional/physical/sexual abuse as a child?: No Did patient suffer from severe childhood neglect?: No Has patient ever been sexually abused/assaulted/raped as an adolescent or adult?: No Was the patient ever a victim of a crime or a disaster?: No Witnessed domestic violence?: No Has patient been affected by domestic violence as an adult?: No  Education:  Highest grade of school patient has completed: Patient reports some college Currently a Ship broker?: No Learning disability?: No  Employment/Work Situation:   Employment situation: Employed 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 What is the longest time patient has a held a job?:  Patient did not answer Where was the patient  employed at that time?: N/A Has patient ever been in the [employer]?: No  Financial Resources:   Financial resources: Private insurance,Income from spouse Does patient have a Programmer, applications or guardian?: No  Alcohol/Substance Abuse:   What has been your use of drugs/alcohol within the last 12 months?: Patient endorses xanax use If attempted suicide, did drugs/alcohol play a role in this?: No Alcohol/Substance Abuse Treatment Hx: Denies past history Has alcohol/substance abuse ever caused legal problems?: No  Social Support System:   Pensions consultant Support System: Good Describe Community Support System: Patient's parents, daughters and her husband Type of faith/religion: Darrick Meigs How does patient's faith help to cope with current illness?: Patient reports it helps her tremendously  Leisure/Recreation:   Do You Have Hobbies?: No  Strengths/Needs:   What is the patient's perception of their strengths?: 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 Patient states they can use these personal strengths during their treatment to contribute to their recovery: Patient reports it keeps her going Patient states these barriers may affect/interfere with their treatment: Patient denied any barriers Patient states these barriers may affect their return to the community: Patient denied any barried to returning to the community. Other important information patient would like considered in planning for their treatment: N.A  Discharge Plan:   Currently receiving community mental health services: Yes (From Whom) (Patient reports [facility].) Patient states concerns and preferences for aftercare planning are: Patient reports that she has an assessment with [facility] on 5/2 and will be doing therapy with them. Patient states they will know when they are safe and ready for discharge when: "I am ready now" Does  patient have access to transportation?: Yes (Patient's mother) Does patient have financial barriers related to discharge medications?: No Patient description of barriers related to discharge medications: N/A Will patient be returning to same living situation after discharge?: Yes  Summary/Recommendations:   Summary and Recommendations (to be completed by the evaluator): Patient is a 50 year old female from [place], [place]. Patient arrived to the hospital under IVC by her daughter. She expresses that she believes that her daughter is in [place] with her husband and that they are out to get her. Previously she reported feeling anxious due to belief that her previous admission was part of a conspiracy against her by her husband, who, in her belief, is planning to sell her family house while she is in the hospital. She was also upset due to her husband refusing to give her money for the new business she planned and put her to the hospital instead per previous assessment. During our meeting, she denied any SI, HI, or AVH. Pt stated that she does not need anything from CSW as she already has her outpatient appointment scheduled for 5/11 with Dr. Sharl Ma. CSW did not press for consent to contact outpatient provider due to agitation. Pt did agree to allow her mother to be contacted. Pt voiced plans to continue with outpatient and not to return to her home with her husband. Recommendations include: crisis stabilization, therapeutic milieu, encourage group attendance and participation, medication management for detox/mood stabilization and development of comprehensive mental wellness/sobriety plan.  Shirl Harris. 10/10/2020

## 2020-10-10 NOTE — [facility] Suicide Risk Assessment (Signed)
[facility] INPATIENT:  Family/Significant Other Suicide Prevention Education  Suicide Prevention Education:  Education Completed; Archie Patten Billings/mother [phone]), has been identified by the patient as the family member/significant other with whom the patient will be residing, and identified as the person(s) who will aid the patient in the event of a mental health crisis (suicidal ideations/suicide attempt).  With written consent from the patient, the family member/significant other has been provided the following suicide prevention education, prior to the and/or following the discharge of the patient.  The suicide prevention education provided includes the following:  Suicide risk factors  Suicide prevention and interventions  National Suicide Hotline telephone number  [facility] assessment telephone number  [facility] Emergency Assistance [place] and/or Residential Mobile Crisis Unit telephone number  Request made of family/significant other to:  Remove weapons (e.g., guns, rifles, knives), all items previously/currently identified as safety concern.    Remove drugs/medications (over-the-counter, prescriptions, illicit drugs), all items previously/currently identified as a safety concern.  The family member/significant other verbalizes understanding of the suicide prevention education information provided.  The family member/significant other agrees to remove the items of safety concern listed above.  Berneda Rose shared that she is uncertain regarding what brought pt into the hospital. She shares that pt's husband reported that pt was being mean and that [place] had [place]. Mother shared that pt was convinced to come back home by the husband. She states that the patient's daughter and husband told her that patient was taking messages off the computer and phone and misconstruing them. Berneda Rose shared concern regarding pt plans to  discharge to a hotel "to be alone" for a few days. CSW validated those concerns but also informed her that there was not anything that either of [place] could do about that. She denied any concerns for pt safety when pt is well. Berneda Rose and CSW discussed pt meds briefly as pt had told mother her medications had been changed. CSW informed her that her original medication had been increased but that pt had declined new medication. No other concerns expressed. Contacted ended without incident.   Shirl Harris 10/10/2020, 12:44 PM

## 2020-10-11 DIAGNOSIS — F3113 Bipolar disorder, current episode manic without psychotic features, severe: Secondary | ICD-10-CM | POA: Diagnosis not present

## 2020-10-11 NOTE — Plan of Care (Signed)
  Problem: Education: Goal: Knowledge of [facility] Education information/materials will improve Outcome: Progressing Goal: Emotional status will improve Outcome: Progressing Goal: Mental status will improve Outcome: Progressing Goal: Verbalization of understanding the information provided will improve Outcome: Progressing   Problem: Activity: Goal: Interest or engagement in activities will improve Outcome: Progressing Goal: Sleeping patterns will improve Outcome: Progressing   Problem: Coping: Goal: Ability to verbalize frustrations and anger appropriately will improve Outcome: Progressing Goal: Ability to demonstrate self-control will improve Outcome: Progressing   Problem: Health Behavior/Discharge Planning: Goal: Identification of resources available to assist in meeting health care needs will improve Outcome: Progressing Goal: Compliance with treatment plan for underlying cause of condition will improve Outcome: Progressing   Problem: Physical Regulation: Goal: Ability to maintain clinical measurements within normal limits will improve Outcome: Progressing   Problem: Safety: Goal: Periods of time without injury will increase Outcome: Progressing   Problem: Education: Goal: Ability to state activities that reduce stress will improve Outcome: Progressing   Problem: Coping: Goal: Ability to identify and develop effective coping behavior will improve Outcome: Progressing   Problem: Self-Concept: Goal: Ability to identify factors that promote anxiety will improve Outcome: Progressing Goal: Level of anxiety will decrease Outcome: Progressing Goal: Ability to modify response to factors that promote anxiety will improve Outcome: Progressing   

## 2020-10-11 NOTE — Plan of Care (Signed)
  Problem: Education: Goal: Knowledge of [facility] Education information/materials will improve Outcome: Progressing Goal: Emotional status will improve Outcome: Progressing Goal: Mental status will improve Outcome: Progressing Goal: Verbalization of understanding the information provided will improve Outcome: Progressing   Problem: Activity: Goal: Interest or engagement in activities will improve Outcome: Progressing Goal: Sleeping patterns will improve Outcome: Progressing   Problem: Coping: Goal: Ability to verbalize frustrations and anger appropriately will improve Outcome: Progressing Goal: Ability to demonstrate self-control will improve Outcome: Progressing   Problem: Health Behavior/Discharge Planning: Goal: Identification of resources available to assist in meeting health care needs will improve Outcome: Progressing Goal: Compliance with treatment plan for underlying cause of condition will improve Outcome: Progressing   Problem: Physical Regulation: Goal: Ability to maintain clinical measurements within normal limits will improve Outcome: Progressing   Problem: Safety: Goal: Periods of time without injury will increase Outcome: Progressing   Problem: Education: Goal: Ability to state activities that reduce stress will improve Outcome: Progressing   Problem: Coping: Goal: Ability to identify and develop effective coping behavior will improve Outcome: Progressing   Problem: Self-Concept: Goal: Ability to identify factors that promote anxiety will improve Outcome: Progressing Goal: Level of anxiety will decrease Outcome: Progressing Goal: Ability to modify response to factors that promote anxiety will improve Outcome: Progressing   

## 2020-10-11 NOTE — Progress Notes (Signed)
[facility] MD Progress Note  10/11/2020 11:36 AM [name]  MRN:  [id number]  Principal Problem: <principal problem not specified> Diagnosis: Active Problems:   Bipolar affective disorder, current episode manic ([place])  50yo F with psych history of bipolar disorder, who was admitted to [facility] unit due to acute mania.  Interval History Patient was seen today for re-evaluation.  Nursing reports no events overnight. The patient has no issues with performing ADLs.  Patient has been medication compliant.  Per RN note: "Patient complains of drowsiness from Depakote, but still took the Depakote and Abilify this morning. Patient does not feel she needs to be on all her medications but is cooperative with taking it in hopes of a faster discharge."  Subjective:  On assessment patient reports "I am here". She is upset due to being in the psych hospital during the Mother`s day. She reports "no complaints". She is taking all medications, including Depakote. She says she hoped her outpatient psychiatrist will eventually switch her from Depakote to Lamictal due to her feeling sedated from Depakote. She is calm today, does not express any delusional content about her family. 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 She denies feeling depressed,. Denies suicidal or homicidal thoughts,. Denies hallucinations. No physical; complaints.  Labs: no new results for review.   Total Time spent with patient: 20 minutes  Past Psychiatric History:  Previous psych diagnosis: bipolar disorder Patient had previous [facility] admissions. Last discharge from [facility] - 10/07/20 on Abilify Maintenna 300mg  IM Q28d (next due 11/03/20) and Abilify 10mg  PO daily. Outpatient psychiatrist: Dr. Adele Schilder in [place]. Past psychotropic medications: Zyprexa, Lamictal, Clonazepam, Abilify.  Past Medical History:  Past Medical History:  Diagnosis Date  . Akathisia 12/17/2014  . Bipolar affective ([place])   . Restless leg syndrome   .  Schizoaffective disorder ([place])   . Schizoaffective disorder [facility])     Past Surgical History:  Procedure Laterality Date  . CESAREAN SECTION    . LAPAROSCOPIC APPENDECTOMY N/A 07/02/2020   Procedure: APPENDECTOMY LAPAROSCOPIC;  Surgeon: Fredirick Maudlin, MD;  Location: [facility] ORS;  Service: General;  Laterality: N/A;   Family History:  Family History  Problem Relation Age of Onset  . Drug abuse Maternal Aunt    Family Psychiatric  History: unknown Social History:  Social History   Substance and Sexual Activity  Alcohol Use No     Social History   Substance and Sexual Activity  Drug Use No    Social History   Socioeconomic History  . Marital status: Married    Spouse name: [name]  . Number of children: 2  . Years of education: 67  . Highest education level: Some college, no degree  Occupational History  . Not on file  Tobacco Use  . Smoking status: Current Every Day Smoker    Packs/day: 0.50    Years: 10.00    Pack years: 5.00    Types: Cigarettes  . Smokeless tobacco: Never Used  Vaping Use  . Vaping Use: Former  Substance and Sexual Activity  . Alcohol use: No  . Drug use: No  . Sexual activity: Yes  Other Topics Concern  . 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 acetaminophen (TYLENOL) tablet 650 mg  650 mg Oral Q6H PRN Clapacs, John T, MD      . alum & mag hydroxide-simeth (MAALOX/MYLANTA) 200-200-20 MG/5ML  suspension 30 mL  30 mL Oral Q4H PRN Clapacs, John T, MD      . ARIPiprazole (ABILIFY) tablet 20 mg  20 mg Oral Daily Clapacs, Madie Reno, MD   20 mg at 10/11/20 0752  . divalproex (DEPAKOTE) DR tablet 500 mg  500 mg Oral Q12H Clapacs, Madie Reno, MD   500 mg at 10/11/20 0752  . magnesium hydroxide (MILK OF MAGNESIA) suspension 30 mL  30 mL Oral Daily PRN Clapacs, Madie Reno, MD        Lab Results:  Results for orders placed or performed during the hospital encounter of 10/09/20 (from the past 48 hour(s))  Comprehensive metabolic panel     Status: Abnormal   Collection Time: 10/09/20  1:12 PM  Result Value Ref Range   Sodium 140 135 - 145 mmol/L   Potassium 3.8 3.5 - 5.1 mmol/L   Chloride 109 98 - 111 mmol/L   CO2 21 (L) 22 - 32 mmol/L   Glucose, Bld 142 (H) 70 - 99 mg/dL    Comment: Glucose reference range applies only to samples taken after fasting for at least 8 hours.   BUN 18 6 - 20 mg/dL   Creatinine, Ser 0.73 0.44 - 1.00 mg/dL   Calcium 9.1 8.9 - 10.3 mg/dL   Total Protein 7.6 6.5 - 8.1 g/dL   Albumin 4.2 3.5 - 5.0 g/dL   AST 23 15 - 41 U/L   ALT 28 0 - 44 U/L   Alkaline Phosphatase 77 38 - 126 U/L   Total Bilirubin 0.5 0.3 - 1.2 mg/dL   GFR, Estimated >60 >60 mL/min    Comment: (NOTE) Calculated using the CKD-EPI Creatinine Equation (2021)    Anion gap 10 5 - 15    Comment: Performed at [facility], [address] [place], [place] [address]  Ethanol     Status: None   Collection Time: 10/09/20  1:12 PM  Result Value Ref Range   Alcohol, Ethyl (B) <10 <10 mg/dL    Comment: (NOTE) Lowest detectable limit for serum alcohol is 10 mg/dL.  For medical purposes only. Performed at [facility], [address] [place], [place] [id number]   Salicylate level     Status: Abnormal   Collection Time: 10/09/20  1:12 PM  Result Value Ref Range   Salicylate Lvl Q000111Q (L) 7.0 - 30.0 mg/dL    Comment: Performed at [facility], [place],  [place] [address]  Acetaminophen level     Status: Abnormal   Collection Time: 10/09/20  1:12 PM  Result Value Ref Range   Acetaminophen (Tylenol), Serum <10 (L) 10 - 30 ug/mL    Comment: (NOTE) Therapeutic concentrations vary significantly. A range of 10-30 ug/mL  may be an effective concentration for many patients. However, some  are best treated at concentrations outside of this range. Acetaminophen concentrations >150 ug/mL  at 4 hours after ingestion  and >50 ug/mL at 12 hours after ingestion are often associated with  toxic reactions.  Performed at [facility], [address] [place], [place] [address]   cbc     Status: None   Collection Time: 10/09/20  1:12 PM  Result Value Ref Range   WBC 8.2 4.0 - 10.5 K/uL   RBC 4.64 3.87 - 5.11 MIL/uL   Hemoglobin 13.3 12.0 - 15.0 g/dL   HCT 40.6 36.0 - 46.0 %   MCV 87.5 80.0 - 100.0 fL   MCH 28.7 26.0 - 34.0 pg   MCHC 32.8 30.0 - 36.0 g/dL   RDW 14.3 11.5 - 15.5 %   Platelets 370 150 - 400 K/uL   nRBC 0.0 0.0 - 0.2 %    Comment: Performed at [facility], [address], [place], [place] [address]  Urine Drug Screen, Qualitative     Status: None   Collection Time: 10/09/20  1:12 PM  Result Value Ref Range   Tricyclic, Ur Screen NONE DETECTED NONE DETECTED   Amphetamines, Ur Screen NONE DETECTED NONE DETECTED   MDMA (Ecstasy)Ur Screen NONE DETECTED NONE DETECTED   Cocaine Metabolite,Ur [place] NONE DETECTED NONE DETECTED   Opiate, Ur Screen NONE DETECTED NONE DETECTED   Phencyclidine (PCP) Ur S NONE DETECTED NONE DETECTED   Cannabinoid 50 Ng, Ur [place] NONE DETECTED NONE DETECTED   Barbiturates, Ur Screen NONE DETECTED NONE DETECTED   Benzodiazepine, Ur Scrn NONE DETECTED NONE DETECTED   Methadone Scn, Ur NONE DETECTED NONE DETECTED    Comment: (NOTE) Tricyclics + metabolites, urine    Cutoff 1000 ng/mL Amphetamines + metabolites, urine  Cutoff 1000 ng/mL MDMA (Ecstasy), urine              Cutoff 500 ng/mL Cocaine  Metabolite, urine          Cutoff 300 ng/mL Opiate + metabolites, urine        Cutoff 300 ng/mL Phencyclidine (PCP), urine         Cutoff 25 ng/mL Cannabinoid, urine                 Cutoff 50 ng/mL Barbiturates + metabolites, urine  Cutoff 200 ng/mL Benzodiazepine, urine              Cutoff 200 ng/mL Methadone, urine                   Cutoff 300 ng/mL  The urine drug screen provides only a preliminary, unconfirmed analytical test result and should not be used for non-medical purposes. Clinical consideration and professional judgment should be applied to any positive drug screen result due to possible interfering substances. A more specific alternate chemical method must be used in order to obtain a confirmed analytical result. Gas chromatography / mass spectrometry (GC/MS) is the preferred confirm atory method. Performed at [facility], [address] [place], [place] [address]     Blood Alcohol level:  Lab Results  Component Value Date   [facility] <10 10/09/2020   ETH <10 62/37/6283    Metabolic Disorder Labs: Lab Results  Component Value Date   HGBA1C 6.2 03/27/2020   No results found for: PROLACTIN Lab Results  Component Value Date   CHOL 188 03/27/2020   TRIG 170.0 (H) 03/27/2020   HDL 49.00 03/27/2020   CHOLHDL 4 03/27/2020   VLDL 34.0 03/27/2020   LDLCALC 105 (H) 03/27/2020   LDLCALC 99 12/18/2014  Physical Findings: AIMS: Facial and Oral Movements Muscles of Facial Expression: None, normal Lips and Perioral Area: None, normal Jaw: None, normal Tongue: None, normal,Extremity Movements Upper (arms, wrists, hands, fingers): None, normal Lower (legs, knees, ankles, toes): None, normal, Trunk Movements Neck, shoulders, hips: None, normal, Overall Severity Severity of abnormal movements (highest score from questions above): None, normal Incapacitation due to abnormal movements: None, normal Patient's awareness of abnormal movements (rate only patient's  report): No Awareness, Dental Status Current problems with teeth and/or dentures?: No Does patient usually wear dentures?: No  CIWA:    COWS:     Musculoskeletal: Strength & Muscle Tone: within normal limits Gait & Station: normal Patient leans: N/A  Psychiatric Specialty Exam: Appearance:  CF, appearing stated age, appears well-nourished;  wearing appropriate to the situation casual clothes, with fair grooming and hygiene. Normal level of alertness and appropriate facial expression.  Attitude/Behavior: calm, cooperative, appropriate eye contact.  Motor: WNL; dyskinesias not evident. Gait appears in full range.  Speech: normal rate  Mood: upset  Affect: appropriate  Thought process: patient appears coherent, somewhat illogical, tangential.  Thought content: patient denies suicidal thoughts, denies homicidal thoughts. No delusions expressed on interview today.  Thought perception: patient denies auditory and visual hallucinations. Did not appear internally stimulated.  Cognition: patient is alert and oriented in self, place, date.  Insight: limited, in regards of understanding of presence, nature, cause, and significance of mental or emotional problem.  Judgement: limited, in regards of ability to make good decisions concerning the appropriate thing to do in various situations, including ability to form opinions regarding their mental health condition.  Physical Exam: Physical Exam ROS Blood pressure 119/70, pulse 72, temperature 98.5 F (36.9 C), temperature source Oral, resp. rate 18, height 5\' 2"  (1.575 m), weight 93 kg, SpO2 96 %. Body mass index is 37.49 kg/m.   Treatment Plan Summary: Daily contact with patient to assess and evaluate symptoms and progress in treatment and Medication management  Patient is a 50 year old female with the above-stated past psychiatric history who is seen in follow-up.  Chart reviewed. Patient discussed with nursing. Patient  is calmer today. Possible still delusional. Poor insight and questionable judgement. Will continue medications without changes.    Plan:  -continue inpatient psych admission; 15-minute checks; daily contact with patient to assess and evaluate symptoms and progress in treatment; psychoeducation.  -Medications: continue Abilify 20 mg PO daily (increased 5/6) and Depakote 500mg  PO BID (started 5/6) to assist with manic symptoms.    -Disposition: Estimated duration of hospitalization: midweek next week. All necessary aftercare will be arranged prior to discharge Likely d/c home with outpatient psych follow-up. Patient has an outpatient [place] care set up.  -  I certify that the patient does need, on a daily basis, active treatment furnished directly by or requiring the supervision of inpatient psychiatric facility personnel.   Larita Fife, MD 10/11/2020, 11:36 AM

## 2020-10-11 NOTE — Progress Notes (Signed)
Patient is less anxious and is calm in the afternoon. She is active on the unit and is appropriate with staff and other patients on the unit.

## 2020-10-11 NOTE — Progress Notes (Signed)
Patient presents to medication room irritable and frustrated. She is frustrated that she is back at the hospital and feels that her family is being controlling. She also expresses concern over not being able to return to work. Patient complains of drowsiness from Depakote, but still took the Depakote and Abilify this morning. Patient does not feel she needs to be on all her medications but is cooperative with taking it in hopes of a faster discharge. Patient is anxious, but eventually calms down and returns to her room. She denies SI, HI, and AVH. Patient remains safe on the unit at this time and q15 min safety checks are maintained.

## 2020-10-11 NOTE — Progress Notes (Signed)
Patient agreed to take Depakote after some medication teaching about side effects and benefits. Calmer this shift. Less anxious. Continues to express concern about being able to return to work with the drowsiness she is experiencing from taking the Depakote. Awake a couple of times this shift.

## 2020-10-11 NOTE — Progress Notes (Signed)
Patient said she was upset about being here on Mother's Day earlier but says she is feeling better now. She misses being at work and is anxious to get back to work, even part-time. She has requested to get her cell phone in the morning out of her locker so she can text her co-worker and let her know that she is going to be out. She is nervous about her supervisor find out that she is here being treated for mental illness.

## 2020-10-12 LAB — VALPROIC ACID LEVEL: Valproic Acid Lvl: 72 ug/mL (ref 50.0–100.0)

## 2020-10-12 MED ORDER — ARIPIPRAZOLE 20 MG PO TABS: 20.0000 mg | ORAL_TABLET | Freq: Every day | ORAL | 1 refills | Status: DC

## 2020-10-12 MED ORDER — DIVALPROEX SODIUM 500 MG PO DR TAB: 1000.0000 mg | DELAYED_RELEASE_TABLET | Freq: Two times a day (BID) | ORAL | 1 refills | Status: DC

## 2020-10-12 NOTE — Progress Notes (Signed)
Pt was educated on prescriptions and follow up care. Pt questions were answered and pt verbalized understanding and did not voice any concerns. [name] belongings were returned. Pt was not in any distress at time of discharge. Pt was safely discharged to the [facility].

## 2020-10-12 NOTE — Progress Notes (Signed)
  [facility] Adult Case Management Discharge Plan :  Will you be returning to the same living situation after discharge:  Yes,  Patient to return to mothers home.  At discharge, do you have transportation home?: Yes,  Family to assist with transportation.  Do you have the ability to pay for your medications: Yes,  [facility] insurance.  Release of information consent forms completed and in the chart;  Patient's signature needed at discharge.  Patient to Follow up at:  Follow-up Information    [facility]. Go on 10/14/2020.   Specialty: Behavioral Health Why: Please attend scheduled appointment TELEPHONE on 14 Oct 2020 @ 1100. This appointment is by telephone, DO NOT COME TO THE OFFICE. Contact information: [address] [place]. Call.   Why: Please call to schedule initial appointment.  Contact information: [phone]              Next level of care provider has access to [place] and Suicide Prevention discussed: Yes,  SPE completed with patient's mother.   Have you used any form of tobacco in the last 30 days? (Cigarettes, Smokeless Tobacco, Cigars, and/or Pipes): Yes  Has patient been referred to the Quitline?: Patient refused referral  Patient has been referred for addiction treatment: N/A  Durenda Hurt, LCSWA 10/12/2020, 10:09 AM

## 2020-10-12 NOTE — Discharge Summary (Signed)
Physician Discharge Summary Note  Patient:  [name] is an 50 y.o., female MRN:  [id number] DOB:  1971/01/04 Patient phone:  [phone] (home)  Patient address:   [place] [address],  Total Time spent with patient: 35 minutes- 25 minutes face-to-face contact with patient, 10 minutes documentation, coordination of care, scripts   Date of Admission:  10/09/2020 Date of Discharge: 10/12/2020  Reason for Admission:  Acute mania  Principal Problem: Bipolar affective disorder, current episode manic [facility]) Discharge Diagnoses: Principal Problem:   Bipolar affective disorder, current episode manic [facility])   Past Psychiatric History:  Previous psych diagnosis: bipolar disorder Patient had previous [place] admissions. Last discharge from [facility] - 10/07/20 on Abilify Maintenna 300mg  IM Q28d (next due 11/03/20) and Abilify 10mg  PO daily. Outpatient psychiatrist: Dr. Adele Patrick in [place]. Past psychotropic medications: Zyprexa, Lamictal, Clonazepam, Abilify.  Past Medical History:  Past Medical History:  Diagnosis Date  . Akathisia 12/17/2014  . Bipolar affective ([place])   . Restless leg syndrome   . Schizoaffective disorder ([place])   . Schizoaffective disorder [facility])     Past Surgical History:  Procedure Laterality Date  . CESAREAN SECTION    . LAPAROSCOPIC APPENDECTOMY N/A 07/02/2020   Procedure: APPENDECTOMY LAPAROSCOPIC;  Surgeon: Brittany Maudlin, MD;  Location: [facility] ORS;  Service: General;  Laterality: N/A;   Family History:  Family History  Problem Relation Age of Onset  . Drug abuse Maternal Aunt    Family Psychiatric  History: Denies  Social History:  Social History   Substance and Sexual Activity  Alcohol Use No     Social History   Substance and Sexual Activity  Drug Use No    Social History   Socioeconomic History  . Marital status: Married    Spouse name: [name]  . Number of children: 2  . Years of education: 96  . Highest education level:  Some college, no degree  Occupational History  . Not on file  Tobacco Use  . Smoking status: Current Every Day Smoker    Packs/day: 0.50    Years: 10.00    Pack years: 5.00    Types: Cigarettes  . Smokeless tobacco: Never Used  Vaping Use  . Vaping Use: Former  Substance and Sexual Activity  . Alcohol use: No  . Drug use: No  . Sexual activity: Yes  Other Topics Concern  . 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 is a 50yo F with psych history of bipolar disorder, who was admitted to [facility] unit due to acute mania. She was given Abilify Maintena LAI during last admission, and next shot due 11/03/20. While here she was restarted on oral abilify and titrated to 20 mg daily. She was also started on Haldol 500 mg BID with valproic acid level 72 on 10/12/20. On day of discharge she denies suicidal ideations, homicidal ideations, visual hallucinations, and auditory hallucinations. Her mother was contacted prior to discharge at [phone]. Mother confirmed that she sounded at baseline on the phone and had no concerns about discharge  today. She notes that her husband does have weapons, but they are currently unloaded and locked away, ammunition is locked in separate container. Her husband was also contacted per request [id number]. He notes he was concerned while she was at home on May 9th due to her paranoia about messages, jumbled thoughts, and leaving the house late at [place] without a plan. He notes that during manic episodes she will often read into things that are not there and feel that people are conspiring against her.  He had no current concerns about her behavior today, thought noted a general pattern of non-compliance with medications at discharge that leads to relapse of symptoms. Crisis plan of calling 911, coming to emergency room, or filing petition reviewed with husband.   Physical Findings: AIMS: Facial and Oral Movements Muscles of Facial Expression: None, normal Lips and Perioral Area: None, normal Jaw: None, normal Tongue: None, normal,Extremity Movements Upper (arms, wrists, hands, fingers): None, normal Lower (legs, knees, ankles, toes): None, normal, Trunk Movements Neck, shoulders, hips: None, normal, Overall Severity Severity of abnormal movements (highest score from questions above): None, normal Incapacitation due to abnormal movements: None, normal Patient's awareness of abnormal movements (rate only patient's report): No Awareness, Dental Status Current problems with teeth and/or dentures?: No Does patient usually wear dentures?: No  CIWA:    COWS:     Musculoskeletal: Strength & Muscle Tone: within normal limits Gait & Station: normal Patient leans: N/A   Psychiatric Specialty Exam: General Appearance: Well Groomed  Engineer, water::  Good  Speech:  Clear and Coherent and Normal Rate  Volume:  Normal  Mood:  Euthymic  Affect:  Congruent  Thought Process:  Coherent and Linear  Orientation:  Full (Time, Place, and Person)  Thought Content:  Logical  Suicidal Thoughts:  No  Homicidal Thoughts:  No  Memory:  Immediate;   Fair Recent;   Fair Remote;   Fair  Judgement:  Intact  Insight:  Fair  Psychomotor Activity:  Normal  Concentration:  Good  Recall:  Good  Fund of Knowledge:Good  Language: Good  Akathisia:  Negative  Handed:  Right  AIMS (if indicated):     Assets:  Communication Skills Desire for Improvement Financial Resources/Insurance Housing Intimacy Leisure Time Physical Health Resilience Social  Support Talents/Skills Transportation Vocational/Educational  Sleep:  Number of Hours: 7  Cognition: WNL  ADL's:  Intact     Physical Exam: Physical Exam Vitals and nursing note reviewed.  Constitutional:      Appearance: Normal appearance.  HENT:     Head: Normocephalic and atraumatic.     Right Ear: External ear normal.     Left Ear: External ear normal.     Nose: Nose normal.     Mouth/Throat:     Mouth: Mucous membranes are moist.     Pharynx: Oropharynx is clear.  Eyes:     Extraocular Movements: Extraocular movements intact.     Conjunctiva/sclera: Conjunctivae normal.     Pupils: Pupils are equal, round, and reactive to light.  Cardiovascular:     Rate and Rhythm: Normal rate.     Pulses: Normal pulses.  Pulmonary:     Effort: Pulmonary effort is normal.     Breath sounds: Normal breath sounds.  Abdominal:     General: Abdomen is flat.     Palpations: Abdomen is soft.  Musculoskeletal:        General: No swelling. Normal range of motion.  Cervical back: Normal range of motion and neck supple.  Skin:    General: Skin is warm and dry.  Neurological:     General: No focal deficit present.     Mental Status: She is alert and oriented to person, place, and time.  Psychiatric:        Mood and Affect: Mood normal.        Behavior: Behavior normal.        Thought Content: Thought content normal.        Judgment: Judgment normal.    Review of Systems  Constitutional: Negative.   HENT: Negative.   Eyes: Negative.   Respiratory: Negative.   Cardiovascular: Negative.   Gastrointestinal: Negative.   Endocrine: Negative.   Genitourinary: Negative.   Musculoskeletal: Negative.   Skin: Negative.   Allergic/Immunologic: Negative.   Neurological: Negative.   Hematological: Negative.   Psychiatric/Behavioral: Negative for behavioral problems, hallucinations, self-injury, sleep disturbance and suicidal ideas.  Blood pressure 118/84, pulse 74, temperature 98.2  F (36.8 C), temperature source Oral, resp. rate 18, height 5\' 2"  (1.575 m), weight 93 kg, SpO2 98 %. Body mass index is 37.49 kg/m.   Have you used any form of tobacco in the last 30 days? (Cigarettes, Smokeless Tobacco, Cigars, and/or Pipes): Yes  Has this patient used any form of tobacco in the last 30 days? (Cigarettes, Smokeless Tobacco, Cigars, and/or Pipes)  Yes, A prescription for an FDA-approved tobacco cessation medication was offered at discharge and the patient refused  Blood Alcohol level:  Lab Results  Component Value Date   [facility] <10 10/09/2020   ETH <10 16/03/9603    Metabolic Disorder Labs:  Lab Results  Component Value Date   HGBA1C 6.2 03/27/2020   No results found for: PROLACTIN Lab Results  Component Value Date   CHOL 188 03/27/2020   TRIG 170.0 (H) 03/27/2020   HDL 49.00 03/27/2020   CHOLHDL 4 03/27/2020   VLDL 34.0 03/27/2020   LDLCALC 105 (H) 03/27/2020   [place] 99 12/18/2014    See Psychiatric Specialty Exam and Suicide Risk Assessment completed by Attending Physician prior to discharge.  Discharge destination:  Home  Is patient on multiple antipsychotic therapies at discharge:  No   Has Patient had three or more failed trials of antipsychotic monotherapy by history:  No  Recommended Plan for Multiple Antipsychotic Therapies: NA  Discharge Instructions    Diet general   Complete by: As directed    Increase activity slowly   Complete by: As directed      Allergies as of 10/12/2020   No Known Allergies     Medication List    TAKE these medications     Indication  ARIPiprazole 20 MG tablet Commonly known as: ABILIFY Take 1 tablet (20 mg total) by mouth daily. Start taking on: Oct 13, 2020 What changed:   medication strength  how much to take  Indication: Manic Phase of Manic-Depression   Abilify Maintena 300 MG Srer injection Generic drug: ARIPiprazole ER Inject 1.5 mLs (300 mg total) into the muscle every 28 (twenty-eight)  days. Start taking on: Nov 03, 2020 What changed: Another medication with the same name was changed. Make sure you understand how and when to take each.  Indication: Manic-Depression   divalproex 500 MG DR tablet Commonly known as: DEPAKOTE Take 2 tablets (1,000 mg total) by mouth every 12 (twelve) hours.  Indication: Manic Phase of Manic-Depression       Follow-up Information    [facility]. Go on 10/14/2020.   Specialty: Behavioral Health Why: Please attend scheduled appointment TELEPHONE on 14 Oct 2020 @ 1100. This appointment is by telephone, DO NOT COME TO THE OFFICE. Contact information: [address] [place]. Call.   Why: Please call to schedule initial appointment.  Contact information: [phone]              Follow-up recommendations:  Activity:  as tolearted Diet:  regular diet  Comments:  Prescriptions sent to [facility] per request. Next Abilify Maintenna due 11/03/20.   Signed: Salley Scarlet, MD 10/12/2020, 11:27 AM

## 2020-10-12 NOTE — Progress Notes (Signed)
Recreation Therapy Notes  Date: 10/12/2020  Time: 10:00 am   Location: Craft room   Behavioral response: Appropriate  Intervention Topic: Stress Management    Discussion/Intervention:  Group content on today was focused on stress. The group defined stress and way to cope with stress. Participants expressed how they know when they are stresses out. Individuals described the different ways they have to cope with stress. The group stated reasons why it is important to cope with stress. Patient explained what good stress is and some examples. The group participated in the intervention "Stress Management". Individuals were separated into two group and answered questions related to stress.  Clinical Observations/Feedback: Patient came to group and expressed that she manages her stress by walking, praying and meditating. She identified shaking and heart racing as physical symptoms of stress. Individual was social with staff and peers while participating in the intervention. Jo Cerone LRT/CTRS         Diannia Hogenson 10/12/2020 11:54 AM

## 2020-10-12 NOTE — Tx Team (Signed)
Interdisciplinary Treatment and Diagnostic Plan Update  10/12/2020 Time of Session: 0900 [name] MRN: [id number]  Principal Diagnosis: <principal problem not specified>  Secondary Diagnoses: Active Problems:   Bipolar affective disorder, current episode manic (HCC)   Current Medications:  Current Facility-Administered Medications  Medication Dose Route Frequency Provider Last Rate Last Admin  . acetaminophen (TYLENOL) tablet 650 mg  650 mg Oral Q6H PRN Clapacs, John T, MD      . alum & mag hydroxide-simeth (MAALOX/MYLANTA) 200-200-20 MG/5ML suspension 30 mL  30 mL Oral Q4H PRN Clapacs, John T, MD      . ARIPiprazole (ABILIFY) tablet 20 mg  20 mg Oral Daily Clapacs, Madie Reno, MD   20 mg at 10/12/20 0728  . divalproex (DEPAKOTE) DR tablet 500 mg  500 mg Oral Q12H Clapacs, Madie Reno, MD   500 mg at 10/12/20 0728  . magnesium hydroxide (MILK OF MAGNESIA) suspension 30 mL  30 mL Oral Daily PRN Clapacs, Madie Reno, MD       PTA Medications: Medications Prior to Admission  Medication Sig Dispense Refill Last Dose  . ARIPiprazole (ABILIFY) 10 MG tablet Take 1 tablet (10 mg total) by mouth daily. 30 tablet 0   . [START ON 11/03/2020] ARIPiprazole ER (ABILIFY MAINTENA) 300 MG SRER injection Inject 1.5 mLs (300 mg total) into the muscle every 28 (twenty-eight) days. 1 each 1     Patient Stressors: Marital or family conflict  Patient Strengths: Ability for insight Active sense of humor Average or above average intelligence Capable of independent living General fund of knowledge Motivation for treatment/growth Physical Health Supportive family/friends Work skills  Treatment Modalities: Medication Management, Group therapy, Case management,  1 to 1 session with clinician, Psychoeducation, Recreational therapy.   Physician Treatment Plan for Primary Diagnosis: <principal problem not specified> Long Term Goal(s): Improvement in symptoms so as ready for discharge Improvement in symptoms so  as ready for discharge   Short Term Goals: Ability to identify changes in lifestyle to reduce recurrence of condition will improve Ability to verbalize feelings will improve Ability to disclose and discuss suicidal ideas Ability to demonstrate self-control will improve Ability to identify and develop effective coping behaviors will improve Ability to maintain clinical measurements within normal limits will improve Compliance with prescribed medications will improve Ability to identify triggers associated with substance abuse/mental health issues will improve Ability to identify changes in lifestyle to reduce recurrence of condition will improve Ability to verbalize feelings will improve Ability to disclose and discuss suicidal ideas Ability to demonstrate self-control will improve Ability to identify and develop effective coping behaviors will improve Ability to maintain clinical measurements within normal limits will improve Compliance with prescribed medications will improve Ability to identify triggers associated with substance abuse/mental health issues will improve  Medication Management: Evaluate patient's response, side effects, and tolerance of medication regimen.  Therapeutic Interventions: 1 to 1 sessions, Unit Group sessions and Medication administration.  Evaluation of Outcomes: Not Met  Physician Treatment Plan for Secondary Diagnosis: Active Problems:   Bipolar affective disorder, current episode manic ([place])  Long Term Goal(s): Improvement in symptoms so as ready for discharge Improvement in symptoms so as ready for discharge   Short Term Goals: Ability to identify changes in lifestyle to reduce recurrence of condition will improve Ability to verbalize feelings will improve Ability to disclose and discuss suicidal ideas Ability to demonstrate self-control will improve Ability to identify and develop effective coping behaviors will improve Ability to maintain clinical  measurements within  normal limits will improve Compliance with prescribed medications will improve Ability to identify triggers associated with substance abuse/mental health issues will improve Ability to identify changes in lifestyle to reduce recurrence of condition will improve Ability to verbalize feelings will improve Ability to disclose and discuss suicidal ideas Ability to demonstrate self-control will improve Ability to identify and develop effective coping behaviors will improve Ability to maintain clinical measurements within normal limits will improve Compliance with prescribed medications will improve Ability to identify triggers associated with substance abuse/mental health issues will improve     Medication Management: Evaluate patient's response, side effects, and tolerance of medication regimen.  Therapeutic Interventions: 1 to 1 sessions, Unit Group sessions and Medication administration.  Evaluation of Outcomes: Not Met   RN Treatment Plan for Primary Diagnosis: <principal problem not specified> Long Term Goal(s): Knowledge of disease and therapeutic regimen to maintain health will improve  Short Term Goals: Ability to remain free from injury will improve, Ability to verbalize frustration and anger appropriately will improve, Ability to demonstrate self-control, Ability to participate in decision making will improve, Ability to verbalize feelings will improve, Ability to disclose and discuss suicidal ideas, Ability to identify and develop effective coping behaviors will improve and Compliance with prescribed medications will improve  Medication Management: RN will administer medications as ordered by provider, will assess and evaluate patient's response and provide education to patient for prescribed medication. RN will report any adverse and/or side effects to prescribing provider.  Therapeutic Interventions: 1 on 1 counseling sessions, Psychoeducation, Medication  administration, Evaluate responses to treatment, Monitor vital signs and CBGs as ordered, Perform/monitor CIWA, COWS, AIMS and Fall Risk screenings as ordered, Perform wound care treatments as ordered.  Evaluation of Outcomes: Not Met   LCSW Treatment Plan for Primary Diagnosis: <principal problem not specified> Long Term Goal(s): Safe transition to appropriate next level of care at discharge, Engage patient in therapeutic group addressing interpersonal concerns.  Short Term Goals: Engage patient in aftercare planning with referrals and resources, Increase social support, Increase ability to appropriately verbalize feelings, Increase emotional regulation, Facilitate acceptance of mental health diagnosis and concerns, Facilitate patient progression through stages of change regarding substance use diagnoses and concerns, Identify triggers associated with mental health/substance abuse issues and Increase skills for wellness and recovery  Therapeutic Interventions: Assess for all discharge needs, 1 to 1 time with Social worker, Explore available resources and support systems, Assess for adequacy in community support network, Educate family and significant other(s) on suicide prevention, Complete Psychosocial Assessment, Interpersonal group therapy.  Evaluation of Outcomes: Not Met   Progress in Treatment: Attending groups: No. Participating in groups: No. Taking medication as prescribed: Yes. Toleration medication: Yes. Family/Significant other contact made: Yes, individual(s) contacted:  Patient identified mother, Phineas Semen, [phone], as collateral contact.  Patient understands diagnosis: Yes. Discussing patient identified problems/goals with staff: Yes. Medical problems stabilized or resolved: Yes. Denies suicidal/homicidal ideation: Yes. Issues/concerns per patient self-inventory: Yes. Other: none   New problem(s) identified: Yes, Describe:  Patient shared situation percieved by  family members as manic episode.   New Short Term/Long Term Goal(s): detox, medication management for mood stabilization; elimination of SI thoughts; development of comprehensive mental wellness/sobriety plan.  Patient Goals: Get out of hospital and reflect on relationship with husband.   Discharge Plan or Barriers: none foreseen at this time.   Reason for Continuation of Hospitalization: Anxiety Mania Medication stabilization  Estimated Length of Stay: 1-7 days  Attendees: Patient: [name] 10/12/2020 9:48 AM  Physician:  Selina Cooley, MD 10/12/2020 9:48 AM  Nursing: Marla Roe, RN 10/12/2020 9:48 AM  RN Care Manager: 10/12/2020 9:48 AM  Social Worker: Paulla Dolly, MSW, [place], [place]  10/12/2020 9:48 AM  Recreational Therapist: Roanna Epley, Reather Converse, LRT  10/12/2020 9:48 AM  Other: Michell Heinrich, MSW, LCSW, LCAS  10/12/2020 9:48 AM  Other: Kiva [place], [place] 10/12/2020 9:48 AM  Other: 10/12/2020 9:48 AM    Scribe for Treatment Team: Durenda Hurt, [place] 10/12/2020 9:48 AM

## 2020-10-12 NOTE — Progress Notes (Signed)
Patient denies SI, HI, and AVH. She is calm and cooperative with assessment. Patient states she slept and ate well. Patient is compliant with morning medications and states that she just wants to discharge and return to work. Patient speech is logical/coherent and judgment/insight is appropriate. Patient denies anxiety and depression. Support and encouragement is provided to patient. Patient remains safe on the unit at this time and q15 min safety checks are maintained.

## 2020-10-12 NOTE — BHH Suicide Risk Assessment (Signed)
[facility] Discharge Suicide Risk Assessment   Principal Problem: Bipolar affective disorder, current episode manic [facility]) Discharge Diagnoses: Principal Problem:   Bipolar affective disorder, current episode manic ([place])   Total Time spent with patient: 35 minutes- 25 minutes face-to-face contact with patient, 10 minutes documentation, coordination of care, scripts   Musculoskeletal: Strength & Muscle Tone: within normal limits Gait & Station: normal Patient leans: N/A  Psychiatric Specialty Exam: Review of Systems  Constitutional: Negative.   HENT: Negative.   Eyes: Negative.   Respiratory: Negative.   Cardiovascular: Negative.   Gastrointestinal: Negative.   Endocrine: Negative.   Genitourinary: Negative.   Musculoskeletal: Negative.   Skin: Negative.   Allergic/Immunologic: Negative.   Neurological: Negative.   Hematological: Negative.   Psychiatric/Behavioral: Negative for behavioral problems, hallucinations, self-injury, sleep disturbance and suicidal ideas.    Blood pressure 118/84, pulse 74, temperature 98.2 F (36.8 C), temperature source Oral, resp. rate 18, height 5\' 2"  (1.575 m), weight 93 kg, SpO2 98 %.Body mass index is 37.49 kg/m.  General Appearance: Well Groomed  Engineer, water::  Good  Speech:  Clear and Coherent and Normal Rate  Volume:  Normal  Mood:  Euthymic  Affect:  Congruent  Thought Process:  Coherent and Linear  Orientation:  Full (Time, Place, and Person)  Thought Content:  Logical  Suicidal Thoughts:  No  Homicidal Thoughts:  No  Memory:  Immediate;   Fair Recent;   Fair Remote;   Fair  Judgement:  Intact  Insight:  Fair  Psychomotor Activity:  Normal  Concentration:  Good  Recall:  Good  Fund of Knowledge:Good  Language: Good  Akathisia:  Negative  Handed:  Right  AIMS (if indicated):     Assets:  Communication Skills Desire for Improvement Financial Resources/Insurance Housing Intimacy Leisure Time Physical  Health Resilience Social Support Talents/Skills Transportation Vocational/Educational  Sleep:  Number of Hours: 7  Cognition: WNL  ADL's:  Intact   Mental Status Per Nursing Assessment::   On Admission:  NA  Demographic Factors:  Caucasian  Loss Factors: NA  Historical Factors: Impulsivity  Risk Reduction Factors:   Sense of responsibility to family, Employed, Living with another person, especially a relative, Positive social support, Positive therapeutic relationship and Positive coping skills or problem solving skills  Continued Clinical Symptoms:  Bipolar Disorder:   Mixed State Previous Psychiatric Diagnoses and Treatments  Cognitive Features That Contribute To Risk:  None    Suicide Risk:  Minimal: No identifiable suicidal ideation.  Patients presenting with no risk factors but with morbid ruminations; may be classified as minimal risk based on the severity of the depressive symptoms   Follow-up [facility]. Go on 10/14/2020.   Specialty: Behavioral Health Why: Please attend scheduled appointment TELEPHONE on 14 Oct 2020 @ 1100. This appointment is by telephone, DO NOT COME TO THE OFFICE. Contact information: [address] [place]. Call.   Why: Please call to schedule initial appointment.  Contact information: [phone]              Plan Of Care/Follow-up recommendations:  Activity:  as tolerated Diet:  regular diet  Salley Scarlet, MD 10/12/2020, 10:12 AM

## 2020-10-14 ENCOUNTER — Other Ambulatory Visit: Payer: Self-pay

## 2020-10-14 ENCOUNTER — Encounter (HOSPITAL_COMMUNITY): Payer: Self-pay | Admitting: Psychiatry

## 2020-10-14 ENCOUNTER — Telehealth (INDEPENDENT_AMBULATORY_CARE_PROVIDER_SITE_OTHER): Payer: 59 | Admitting: Psychiatry

## 2020-10-14 VITALS — Wt 205.0 lb

## 2020-10-14 DIAGNOSIS — F419 Anxiety disorder, unspecified: Secondary | ICD-10-CM

## 2020-10-14 DIAGNOSIS — F319 Bipolar disorder, unspecified: Secondary | ICD-10-CM | POA: Diagnosis not present

## 2020-10-14 MED ORDER — ARIPIPRAZOLE ER 400 MG IM PRSY: 400.0000 mg | PREFILLED_SYRINGE | INTRAMUSCULAR | 1 refills | Status: DC

## 2020-10-14 NOTE — Progress Notes (Signed)
Virtual Visit via Video Note  I connected with Mercer Pod on 10/14/20 at 11:00 AM EDT by a video enabled telemedicine application and verified that I am speaking with the correct person using two identifiers.  Location: Patient: Mother's home Provider: Home office   I discussed the limitations of evaluation and management by telemedicine and the availability of in person appointments. The patient expressed understanding and agreed to proceed.  History of Present Illness: Patient is a 50 year old Caucasian, employed married female with a history of bipolar disorder, noncompliant with medication and follow-ups.  Patient is known to this Probation officer from previous visits.  She was last seen in August 2020.  She had missed appointments and stopped taking the medication until recently her PCP contact that she is decompensating and needing medication.  She had appointment scheduled today however recently she has been hospitalized twice due to acute mania, delusion, psychosis, hallucination.  She was admitted at [facility] on April 30 to May for and then readmitted at [place] on May 6 and discharged on May 9.  Her family was very concerned about her medical symptoms.  She was experiencing delusions, paranoia, mania, paranoid thinking.  She reported believing her telephone is being hacked, she wanted to get away from the family member but husband refused to give her keys of the car.  She is having issues at work, coworkers, husband and daughters.  She admitted not sleeping well.  She wanted to quit the job because she felt people were bothering her.  She had a big business plan and lately she started reading her husband's messenger and accusing husband that he is talking to family members about her knowledge.  As per chart she was also talking and laughing to self.  Today patient remains very emotional and felt that she does not need the medication but realize that she has to  because everyone told her that she has bipolar disorder.  Patient has difficulty accepting her illness.  But willing to continue the medication.  She has been off of work for 2 weeks as not able to function.  She like to go back to work today.  She remains very emotional, tearful, easily distracted, paranoid but denies any suicidal thoughts or homicidal thoughts.  Today she told that she does not need that many medication however after some encouragement she is willing to keep it for now.  She was given Abilify injection 300 mg on March 30.  She is on Abilify 20 mg daily and Depakote 30 mg a day.  Her last Depakote level is 72.  She denies drinking or using any illegal substances.  She is moved and now living with her mother who is very supportive for the time being.  She does talk to her husband but sometimes she gets into argument.  She does sometimes confusion and unable to make decision.  She is sleeping at least 5 hours.  She wants to get better but does not know how.  She was thinking to get therapy from employee counseling benefit but has not schedule appointment.  She denies any tremors, shakes or any EPS.  Sometimes she feels a burden to family but denies any feelings of hopelessness or suicidal thoughts.  Her cousin committed suicide and she does not her family go through the same.  Patient is working at [employer] for more than 2 years.  She had a history of multiple jobs but not able to keep because of her psychiatric  illness and not consistent with medication and follow-up.  Past Psychiatric History:Reviewed H/Obipolar disorder and PTSD.H/O multiple inpatient at [facility] and [facility]. Last inpatient May 2022. H/Omania, psychosis, suicidal attemptwithoverdose. Tried olanzapine, lithium, Trilafon, carbamazepine, amantadine and Klonopin.In the past she saw Dr. Gretel Acre, Dr. Valeda Malm and Dr. Jake Michaelis in [place].  Recent Results (from the past 2160 hour(s))  Urinalysis, Routine w reflex microscopic      Status: Abnormal   Collection Time: 08/13/20  9:36 AM  Result Value Ref Range   Specific Gravity, UA 1.007 1.005 - 1.030   pH, UA 5.5 5.0 - 7.5   Color, UA Yellow Yellow   Appearance Ur Clear Clear   Leukocytes,UA Negative Negative   Protein,UA Negative Negative/Trace   Glucose, UA Negative Negative   Ketones, UA Negative Negative   RBC, UA 2+ (A) Negative   Bilirubin, UA Negative Negative   Urobilinogen, Ur 0.2 0.2 - 1.0 mg/dL   Nitrite, UA Negative Negative   Microscopic Examination See below:     Comment: Microscopic was indicated and was performed.  Urine Culture     Status: None   Collection Time: 08/13/20  9:36 AM   Specimen: Urine   Urine  Result Value Ref Range   Urine Culture, Routine Final report    Organism ID, Bacteria Comment     Comment: Mixed urogenital flora Less than 10,000 colonies/mL   Microscopic Examination     Status: Abnormal   Collection Time: 08/13/20  9:36 AM   Urine  Result Value Ref Range   WBC, UA None seen 0 - 5 /hpf   RBC None seen 0 - 2 /hpf   Epithelial Cells (non renal) 0-10 0 - 10 /hpf   Casts None seen None seen /lpf   Bacteria, UA None seen None seen/Few   Yeast, UA Present (A) None seen  Cervicovaginal ancillary only( [place])     Status: Abnormal   Collection Time: 08/13/20 10:11 AM  Result Value Ref Range   Bacterial Vaginitis (gardnerella) Positive (A)    Candida Vaginitis Negative    Candida Glabrata Negative    Comment Normal Reference Range Candida Species - Negative    Comment Normal Reference Range Candida Galbrata - Negative    Comment      Normal Reference Range Bacterial Vaginosis - Negative  Comprehensive metabolic panel     Status: Abnormal   Collection Time: 10/03/20 10:56 AM  Result Value Ref Range   Sodium 140 135 - 145 mmol/L   Potassium 4.0 3.5 - 5.1 mmol/L   Chloride 111 98 - 111 mmol/L   CO2 19 (L) 22 - 32 mmol/L   Glucose, Bld 118 (H) 70 - 99 mg/dL    Comment: Glucose reference range applies only to  samples taken after fasting for at least 8 hours.   BUN 14 6 - 20 mg/dL   Creatinine, Ser 0.70 0.44 - 1.00 mg/dL   Calcium 8.9 8.9 - 10.3 mg/dL   Total Protein 8.0 6.5 - 8.1 g/dL   Albumin 4.4 3.5 - 5.0 g/dL   AST 22 15 - 41 U/L   ALT 22 0 - 44 U/L   Alkaline Phosphatase 88 38 - 126 U/L   Total Bilirubin 0.5 0.3 - 1.2 mg/dL   GFR, Estimated >60 >60 mL/min    Comment: (NOTE) Calculated using the CKD-EPI Creatinine Equation (2021)    Anion gap 10 5 - 15    Comment: Performed at [facility], [address] [place],  [place] [address]  Ethanol     Status: None   Collection Time: 10/03/20 10:56 AM  Result Value Ref Range   Alcohol, Ethyl (B) <10 <10 mg/dL    Comment: (NOTE) Lowest detectable limit for serum alcohol is 10 mg/dL.  For medical purposes only. Performed at [facility], [address] [place], [place] [address]   Salicylate level     Status: Abnormal   Collection Time: 10/03/20 10:56 AM  Result Value Ref Range   Salicylate Lvl <1.0 (L) 7.0 - 30.0 mg/dL    Comment: Performed at [facility], [place], [place] [address]  Acetaminophen level     Status: Abnormal   Collection Time: 10/03/20 10:56 AM  Result Value Ref Range   Acetaminophen (Tylenol), Serum <10 (L) 10 - 30 ug/mL    Comment: (NOTE) Therapeutic concentrations vary significantly. A range of 10-30 ug/mL  may be an effective concentration for many patients. However, some  are best treated at concentrations outside of this range. Acetaminophen concentrations >150 ug/mL at 4 hours after ingestion  and >50 ug/mL at 12 hours after ingestion are often associated with  toxic reactions.  Performed at [facility], [address] [place], [place] [address]   cbc     Status: Abnormal   Collection Time: 10/03/20 10:56 AM  Result Value Ref Range   WBC 11.7 (H) 4.0 - 10.5 K/uL   RBC 4.65 3.87 - 5.11 MIL/uL   Hemoglobin 13.6 12.0 - 15.0 g/dL   HCT 41.1 36.0  - 46.0 %   MCV 88.4 80.0 - 100.0 fL   MCH 29.2 26.0 - 34.0 pg   MCHC 33.1 30.0 - 36.0 g/dL   RDW 14.5 11.5 - 15.5 %   Platelets 349 150 - 400 K/uL   nRBC 0.0 0.0 - 0.2 %    Comment: Performed at [facility], [address] [place], [place] [address]  Urine Drug Screen, Qualitative     Status: None   Collection Time: 10/03/20 10:56 AM  Result Value Ref Range   Tricyclic, Ur Screen NONE DETECTED NONE DETECTED   Amphetamines, Ur Screen NONE DETECTED NONE DETECTED   MDMA (Ecstasy)Ur Screen NONE DETECTED NONE DETECTED   Cocaine Metabolite,Ur [place] NONE DETECTED NONE DETECTED   Opiate, Ur Screen NONE DETECTED NONE DETECTED   Phencyclidine (PCP) Ur S NONE DETECTED NONE DETECTED   Cannabinoid 50 Ng, Ur [place] NONE DETECTED NONE DETECTED   Barbiturates, Ur Screen NONE DETECTED NONE DETECTED   Benzodiazepine, Ur Scrn NONE DETECTED NONE DETECTED   Methadone Scn, Ur NONE DETECTED NONE DETECTED    Comment: (NOTE) Tricyclics + metabolites, urine    Cutoff 1000 ng/mL Amphetamines + metabolites, urine  Cutoff 1000 ng/mL MDMA (Ecstasy), urine              Cutoff 500 ng/mL Cocaine Metabolite, urine          Cutoff 300 ng/mL Opiate + metabolites, urine        Cutoff 300 ng/mL Phencyclidine (PCP), urine         Cutoff 25 ng/mL Cannabinoid, urine                 Cutoff 50 ng/mL Barbiturates + metabolites, urine  Cutoff 200 ng/mL Benzodiazepine, urine              Cutoff 200 ng/mL Methadone, urine  Cutoff 300 ng/mL  The urine drug screen provides only a preliminary, unconfirmed analytical test result and should not be used for non-medical purposes. Clinical consideration and professional judgment should be applied to any positive drug screen result due to possible interfering substances. A more specific alternate chemical method must be used in order to obtain a confirmed analytical result. Gas chromatography / mass spectrometry (GC/MS) is the preferred confirm atory  method. Performed at [facility], [address] [place], [place] [address]   Comprehensive metabolic panel     Status: Abnormal   Collection Time: 10/09/20  1:12 PM  Result Value Ref Range   Sodium 140 135 - 145 mmol/L   Potassium 3.8 3.5 - 5.1 mmol/L   Chloride 109 98 - 111 mmol/L   CO2 21 (L) 22 - 32 mmol/L   Glucose, Bld 142 (H) 70 - 99 mg/dL    Comment: Glucose reference range applies only to samples taken after fasting for at least 8 hours.   BUN 18 6 - 20 mg/dL   Creatinine, Ser 0.73 0.44 - 1.00 mg/dL   Calcium 9.1 8.9 - 10.3 mg/dL   Total Protein 7.6 6.5 - 8.1 g/dL   Albumin 4.2 3.5 - 5.0 g/dL   AST 23 15 - 41 U/L   ALT 28 0 - 44 U/L   Alkaline Phosphatase 77 38 - 126 U/L   Total Bilirubin 0.5 0.3 - 1.2 mg/dL   GFR, Estimated >60 >60 mL/min    Comment: (NOTE) Calculated using the CKD-EPI Creatinine Equation (2021)    Anion gap 10 5 - 15    Comment: Performed at [facility], [address] [place], [place] [address]  Ethanol     Status: None   Collection Time: 10/09/20  1:12 PM  Result Value Ref Range   Alcohol, Ethyl (B) <10 <10 mg/dL    Comment: (NOTE) Lowest detectable limit for serum alcohol is 10 mg/dL.  For medical purposes only. Performed at [facility], [address] [place], [place] [id number]   Salicylate level     Status: Abnormal   Collection Time: 10/09/20  1:12 PM  Result Value Ref Range   Salicylate Lvl [id number] (L) 7.0 - 30.0 mg/dL    Comment: Performed at [facility], [place], [place] [address]  Acetaminophen level     Status: Abnormal   Collection Time: 10/09/20  1:12 PM  Result Value Ref Range   Acetaminophen (Tylenol), Serum <10 (L) 10 - 30 ug/mL    Comment: (NOTE) Therapeutic concentrations vary significantly. A range of 10-30 ug/mL  may be an effective concentration for many patients. However, some  are best treated at concentrations outside of this range. Acetaminophen  concentrations >150 ug/mL at 4 hours after ingestion  and >50 ug/mL at 12 hours after ingestion are often associated with  toxic reactions.  Performed at [facility], [address] [place],  [address]   cbc     Status: None   Collection Time: 10/09/20  1:12 PM  Result Value Ref Range   WBC 8.2 4.0 - 10.5 K/uL   RBC 4.64 3.87 - 5.11 MIL/uL   Hemoglobin 13.3 12.0 - 15.0 g/dL   HCT 40.6 36.0 - 46.0 %   MCV 87.5 80.0 - 100.0 fL   MCH 28.7 26.0 - 34.0 pg   MCHC 32.8 30.0 - 36.0 g/dL   RDW 14.3 11.5 - 15.5 %   Platelets 370 150 - 400  K/uL   nRBC 0.0 0.0 - 0.2 %    Comment: Performed at [facility], [address] [place], [place] [address]  Urine Drug Screen, Qualitative     Status: None   Collection Time: 10/09/20  1:12 PM  Result Value Ref Range   Tricyclic, Ur Screen NONE DETECTED NONE DETECTED   Amphetamines, Ur Screen NONE DETECTED NONE DETECTED   MDMA (Ecstasy)Ur Screen NONE DETECTED NONE DETECTED   Cocaine Metabolite,Ur [place] NONE DETECTED NONE DETECTED   Opiate, Ur Screen NONE DETECTED NONE DETECTED   Phencyclidine (PCP) Ur S NONE DETECTED NONE DETECTED   Cannabinoid 50 Ng, Ur [place] NONE DETECTED NONE DETECTED   Barbiturates, Ur Screen NONE DETECTED NONE DETECTED   Benzodiazepine, Ur Scrn NONE DETECTED NONE DETECTED   Methadone Scn, Ur NONE DETECTED NONE DETECTED    Comment: (NOTE) Tricyclics + metabolites, urine    Cutoff 1000 ng/mL Amphetamines + metabolites, urine  Cutoff 1000 ng/mL MDMA (Ecstasy), urine              Cutoff 500 ng/mL Cocaine Metabolite, urine          Cutoff 300 ng/mL Opiate + metabolites, urine        Cutoff 300 ng/mL Phencyclidine (PCP), urine         Cutoff 25 ng/mL Cannabinoid, urine                 Cutoff 50 ng/mL Barbiturates + metabolites, urine  Cutoff 200 ng/mL Benzodiazepine, urine              Cutoff 200 ng/mL Methadone, urine                   Cutoff 300 ng/mL  The urine drug screen provides only a preliminary,  unconfirmed analytical test result and should not be used for non-medical purposes. Clinical consideration and professional judgment should be applied to any positive drug screen result due to possible interfering substances. A more specific alternate chemical method must be used in order to obtain a confirmed analytical result. Gas chromatography / mass spectrometry (GC/MS) is the preferred confirm atory method. Performed at [facility], [address] [place], [place] [address]   Valproic acid level     Status: None   Collection Time: 10/12/20 10:40 AM  Result Value Ref Range   Valproic Acid Lvl 72 50.0 - 100.0 ug/mL    Comment: Performed at [facility], [address], [place], [place] [address]     Psychiatric Specialty Exam: Physical Exam  Review of Systems  Weight 205 lb (93 kg).There is no height or weight on file to calculate BMI.  General Appearance: Fairly Groomed  Eye Contact:  Fair  Speech:  Slow  Volume:  Increased  Mood:  Anxious and emotional  Affect:  Tearful and labile  Thought Process:  Descriptions of Associations: Circumstantial  Orientation:  Full (Time, Place, and Person)  Thought Content:  Ideas of Reference:   Paranoia and Paranoid Ideation  Suicidal Thoughts:  No  Homicidal Thoughts:  No  Memory:  Immediate;   Fair Recent;   Fair Remote;   Fair  Judgement:  Fair  Insight:  Lacking  Psychomotor Activity:  Increased  Concentration:  Concentration: Fair and Attention Span: Fair  Recall:  [employer] of Knowledge:  Fair  Language:  Fair  Akathisia:  No  Handed:  Right  AIMS (if indicated):     Assets:  Communication Skills Desire for Improvement  Social Support  ADL's:  Intact  Cognition:  WNL  Sleep:   better      Assessment and Plan: Bipolar disorder type I.  Anxiety.  I reviewed blood work results, past notes, recent hospitalization, current medication.  Patient has significant history of noncompliance with  medication and follow-up.  She still have lack of insight into her illness but willing to keep the medication for now.  She wants to go back to work which I do not believe that she is a very at this time.  She is still very emotional, paranoid.  Though she does not meet criteria for inpatient but requires compliance with medication and therapy.  She does not have suicidal ideation or homicidal ideation.  We will extend her FMLA until May 31.  I recommend to try Abilify 400 mg intramuscular next injection to be given in last week of May.  After some encouragement she agreed to keep the injection in the future and we will consider taking her off from oral psychotropics provided patient remains stable.  I also encouraged she should contact employees Council benefit to get therapy through the work.  She agreed to work on it.  Follow-up in 2 weeks.  Discussed safety concerns and anytime having it suicidal thoughts or homicidal [place] need to call 911 or go to local his room.  Patient currently living at her mother and trying to keep her distance with other family members as she feels it does escalate her emotions.   Follow Up Instructions:    I discussed the assessment and treatment plan with the patient. The patient was provided an opportunity to ask questions and all were answered. The patient agreed with the plan and demonstrated an understanding of the instructions.   The patient was advised to call back or seek an in-person evaluation if the symptoms worsen or if the condition fails to improve as anticipated.  I provided 55 minutes of non-face-to-face time during this encounter.   Kathlee Nations, MD

## 2020-10-16 ENCOUNTER — Telehealth (HOSPITAL_COMMUNITY): Payer: Self-pay

## 2020-10-16 ENCOUNTER — Ambulatory Visit
Admission: EM | Admit: 2020-10-16 | Discharge: 2020-10-16 | Disposition: A | Discharge status: Home / Self Care | Payer: 59 | Attending: Emergency Medicine | Admitting: Emergency Medicine

## 2020-10-16 DIAGNOSIS — J069 Acute upper respiratory infection, unspecified: Secondary | ICD-10-CM | POA: Diagnosis not present

## 2020-10-16 NOTE — Telephone Encounter (Signed)
Medication management - Prior authorization for patient's Abilify M[place]ena 400 mg IM injection submitted online with CoverMyMeds and sent to [employer] for review this date.

## 2020-10-16 NOTE — ED Triage Notes (Signed)
Pt reports having nasal congestion and sore throat that began on Wednesday. Neg covid test Wednesday at health at work and neg home covid test Tuesday. [facility] at work said she needs another test today.

## 2020-10-16 NOTE — Discharge Instructions (Addendum)
Your COVID test is pending.  You should self quarantine until the test result is back.    Follow up with your primary care provider if your symptoms are not improving.      

## 2020-10-16 NOTE — ED Provider Notes (Signed)
Roderic Palau    CSN: [id number] Arrival date & time: 10/16/20  0859      History   Chief Complaint Chief Complaint  Patient presents with  . Nasal Congestion    HPI [name] is a 50 y.o. female.   Patient presents with 2-day history of nasal congestion, postnasal drip, sore throat.  She denies fever, chills, rash, cough, shortness of breath, vomiting, diarrhea, or other symptoms.  OTC treatment attempted at home.  Patient was seen in the ED on 10/09/2020; diagnosed with agitation; she was brought to the ED by the police for IVC.  Medical history includes schizoaffective disorder and bipolar disorder.  The history is provided by the patient and medical records.    Past Medical History:  Diagnosis Date  . Akathisia 12/17/2014  . Bipolar affective ([place])   . Restless leg syndrome   . Schizoaffective disorder ([place])   . Schizoaffective disorder [facility])     Patient Active Problem List   Diagnosis Date Noted  . Bipolar affective disorder, current episode manic ([place]) 10/03/2020  . Vaginitis 08/14/2020  . Yeast vaginitis 08/14/2020  . Steatosis of liver 07/20/2020  . S/P laparoscopic appendectomy 07/07/2020  . Abdominal pain 07/04/2020  . Sepsis ([place])   . Pneumoperitoneum   . Acute appendicitis 07/02/2020  . Bipolar affective ([place])   . Encounter for medical examination to establish care 03/27/2020  . Obesity, unspecified 07/28/2017  . Restless legs 12/17/2014    Past Surgical History:  Procedure Laterality Date  . APPENDECTOMY    . CESAREAN SECTION    . LAPAROSCOPIC APPENDECTOMY N/A 07/02/2020   Procedure: APPENDECTOMY LAPAROSCOPIC;  Surgeon: Fredirick Maudlin, MD;  Location: [facility] ORS;  Service: General;  Laterality: N/A;    OB History   No obstetric history on file.      Home Medications    Prior to Admission medications   Medication Sig Start Date End Date Taking? Authorizing Provider  ARIPiprazole (ABILIFY) 20 MG tablet Take 1 tablet (20 mg total)  by mouth daily. 10/13/20   Salley Scarlet, MD  ARIPiprazole ER (ABILIFY MAINTENA) 400 MG PRSY prefilled syringe Inject 400 mg into the muscle every 28 (twenty-eight) days. 10/14/20   Arfeen, Arlyce Harman, MD  divalproex (DEPAKOTE) 500 MG DR tablet Take 2 tablets (1,000 mg total) by mouth every 12 (twelve) hours. 10/12/20   Salley Scarlet, MD    Family History Family History  Problem Relation Age of Onset  . Drug abuse Maternal Aunt     Social History Social History   Tobacco Use  . Smoking status: Current Every Day Smoker    Packs/day: 0.50    Years: 10.00    Pack years: 5.00    Types: Cigarettes  . Smokeless tobacco: Never Used  Vaping Use  . Vaping Use: Former  Substance Use Topics  . Alcohol use: No  . Drug use: No     Allergies   Patient has no known allergies.   Review of Systems Review of Systems  Constitutional: Negative for chills and fever.  HENT: Positive for congestion, postnasal drip and sore throat. Negative for ear pain.   Respiratory: Negative for cough and shortness of breath.   Cardiovascular: Negative for chest pain and palpitations.  Gastrointestinal: Negative for abdominal pain, diarrhea and vomiting.  Skin: Negative for color change and rash.  All other systems reviewed and are negative.    Physical Exam Triage Vital Signs ED Triage Vitals [10/16/20 0903]  Enc Vitals Group  BP 127/79     Pulse Rate 85     Resp 18     Temp 98.8 F (37.1 C)     Temp Source Oral     SpO2 96 %     Weight      Height      Head Circumference      Peak Flow      Pain Score      Pain Loc      Pain Edu?      Excl. in [place]?    No data found.  Updated Vital Signs BP 127/79   Pulse 85   Temp 98.8 F (37.1 C) (Oral)   Resp 18   Ht 5\' 2"  (1.575 m)   Wt 210 lb (95.3 kg)   SpO2 96%   BMI 38.41 kg/m   Visual Acuity Right Eye Distance:   Left Eye Distance:   Bilateral Distance:    Right Eye Near:   Left Eye Near:    Bilateral Near:     Physical  Exam Vitals and nursing note reviewed.  Constitutional:      General: She is not in acute distress.    Appearance: She is well-developed. She is not ill-appearing.  HENT:     Head: Normocephalic and atraumatic.     Right Ear: Tympanic membrane normal.     Left Ear: Tympanic membrane normal.     Nose: Nose normal.     Mouth/Throat:     Mouth: Mucous membranes are moist.     Pharynx: Oropharynx is clear.  Eyes:     Conjunctiva/sclera: Conjunctivae normal.  Cardiovascular:     Rate and Rhythm: Normal rate and regular rhythm.     Heart sounds: Normal heart sounds.  Pulmonary:     Effort: Pulmonary effort is normal. No respiratory distress.     Breath sounds: Normal breath sounds.  Abdominal:     Palpations: Abdomen is soft.     Tenderness: There is no abdominal tenderness.  Musculoskeletal:     Cervical back: Neck supple.  Skin:    General: Skin is warm and dry.  Neurological:     General: No focal deficit present.     Mental Status: She is alert and oriented to person, place, and time.  Psychiatric:        Mood and Affect: Mood normal.        Behavior: Behavior normal.      UC Treatments / Results  Labs (all labs ordered are listed, but only abnormal results are displayed) Labs Reviewed - No data to display  EKG   Radiology No results found.  Procedures Procedures (including critical care time)  Medications Ordered in UC Medications - No data to display  Initial Impression / Assessment and Plan / UC Course  I have reviewed the triage vital signs and the nursing notes.  Pertinent labs & imaging results that were available during my care of the patient were reviewed by me and considered in my medical decision making (see chart for details).   Viral URI.  States she was scheduled to return to work 2 days ago from [employer] but developed her current symptoms.  She had a negative PCR COVID test through health at work.  She states she was told to go to [place] for  repeat PCR COVID but chose to come here instead.  I explained to her that her COVID tests take longer to come back than the [facility] UC.  She  states she would like to have the test done here and plans to extend her FMLA through her PCP.  Discussed symptomatic treatment; PCR COVID done here.  Instructed her to follow-up with her PCP as scheduled.   Final Clinical Impressions(s) / UC Diagnoses   Final diagnoses:  Viral URI     Discharge Instructions     Your COVID test is pending.  You should self quarantine until the test result is back.  Follow-up with your primary care provider if your symptoms are not improving.    ED Prescriptions    None     PDMP not reviewed this encounter.   Sharion Balloon, NP 10/16/20 [phone]

## 2020-10-17 LAB — SARS-COV-2, NAA 2 DAY TAT

## 2020-10-17 LAB — NOVEL CORONAVIRUS, NAA: SARS-CoV-2, NAA: NOT DETECTED

## 2020-10-21 ENCOUNTER — Other Ambulatory Visit: Payer: Self-pay

## 2020-10-21 ENCOUNTER — Telehealth (HOSPITAL_COMMUNITY): Payer: Self-pay

## 2020-10-21 DIAGNOSIS — F319 Bipolar disorder, unspecified: Secondary | ICD-10-CM

## 2020-10-21 DIAGNOSIS — F419 Anxiety disorder, unspecified: Secondary | ICD-10-CM

## 2020-10-21 MED ORDER — ARIPIPRAZOLE ER 400 MG IM PRSY
400.0000 mg | PREFILLED_SYRINGE | INTRAMUSCULAR | 1 refills | Status: DC
  Filled 2020-10-21: qty 1, 28d supply, fill #0

## 2020-10-21 NOTE — Telephone Encounter (Signed)
Writer has been speaking with patient and her pharmacies regarding the Abilify Maintena 400mg  prefilled syringe. It was sent in to [facility] however we're having trouble getting it filled there without a PA. When I did the PA, pt's insurance [employer] stated that a PA was not required because it is a covered benefit. The pharmacy cannot override that. Writer then spoke with [facility] and it goes through there without a problem & no PA required. Please resend pt's Abilify Maintena 400mg  injection to [facility]. Thank you

## 2020-10-21 NOTE — Telephone Encounter (Signed)
Send to [facility].

## 2020-10-22 ENCOUNTER — Other Ambulatory Visit: Payer: Self-pay

## 2020-10-23 ENCOUNTER — Telehealth (INDEPENDENT_AMBULATORY_CARE_PROVIDER_SITE_OTHER): Payer: 59 | Admitting: Psychiatry

## 2020-10-23 ENCOUNTER — Other Ambulatory Visit: Payer: Self-pay

## 2020-10-23 ENCOUNTER — Encounter (HOSPITAL_COMMUNITY): Payer: Self-pay | Admitting: Psychiatry

## 2020-10-23 VITALS — Wt 210.0 lb

## 2020-10-23 DIAGNOSIS — F319 Bipolar disorder, unspecified: Secondary | ICD-10-CM

## 2020-10-23 DIAGNOSIS — F419 Anxiety disorder, unspecified: Secondary | ICD-10-CM | POA: Diagnosis not present

## 2020-10-23 MED ORDER — ARIPIPRAZOLE 20 MG PO TABS
20.0000 mg | ORAL_TABLET | Freq: Every day | ORAL | 0 refills | Status: DC
  Filled 2020-10-23 – 2020-10-26 (×2): qty 30, 30d supply, fill #0

## 2020-10-23 MED ORDER — DIVALPROEX SODIUM 500 MG PO DR TAB
1000.0000 mg | DELAYED_RELEASE_TABLET | Freq: Two times a day (BID) | ORAL | 0 refills | Status: DC
  Filled 2020-10-23 – 2020-10-26 (×2): qty 60, 15d supply, fill #0

## 2020-10-23 NOTE — Progress Notes (Signed)
Virtual Visit via Video Note  I connected with [name] on 10/23/20 at 10:20 AM EDT by a video enabled telemedicine application and verified that I am speaking with the correct person using two identifiers.  Location: Patient: Mother home Provider: Home Office   I discussed the limitations of evaluation and management by telemedicine and the availability of in person appointments. The patient expressed understanding and agreed to proceed.  History of Present Illness: Patient is evaluated by a video session.  She is somewhat better today.  She is less emotional and less irritable and she feels medicine helping.  She also have more insight into her illness and realized she need to take the medicine every day.  She is sleeping okay.  She denies any recent mania, psychosis, hallucination but is still feeling groggy, frustrated.  Her Abilify injection was approved and she will get injection on Wednesday.  She like to go back to work and like to go back to her home with her husband.  I also spoke to her mother who was with her during session.  I explained risk of relapse, distress related to family situation at work.  Patient feels much better today and like to give a try.  She had a good support from her parents.  She is concerned about her financial and does not want to be behind in bills.  She is working as a Hotel manager in the hospital.  So far she is tolerating medication except having grogginess.  She is hoping to adjust the dose of the medication once she starts Abilify injection.  She denies any crying spells, hallucination, suicidal thoughts but is still have anxiety.    Past Psychiatric History:Reviewed H/Obipolar disorder and PTSD.H/O multiple inpatient at [facility] and [facility]. Last inpatient May 2022. H/Omania, psychosis, suicidal attemptwithoverdose. Tried olanzapine, lithium, Trilafon, carbamazepine, amantadine and Klonopin.In the past she saw Dr. Gretel Acre, Dr. Valeda Malm and Dr.  Jake Michaelis in [place].  Psychiatric Specialty Exam: Physical Exam  Review of Systems  Weight 210 lb (95.3 kg).There is no height or weight on file to calculate BMI.  General Appearance: Casual  Eye Contact:  Fair  Speech:  Normal Rate  Volume:  Normal  Mood:  emotional  Affect:  Labile  Thought Process:  Descriptions of Associations: Intact  Orientation:  Full (Time, Place, and Person)  Thought Content:  Rumination  Suicidal Thoughts:  No  Homicidal Thoughts:  No  Memory:  Immediate;   Fair Recent;   Good Remote;   Fair  Judgement:  Fair  Insight:  Shallow  Psychomotor Activity:  Increased  Concentration:  Concentration: Fair and Attention Span: Fair  Recall:  [employer] of Knowledge:  Fair  Language:  Good  Akathisia:  No  Handed:  Right  AIMS (if indicated):     Assets:  Communication Skills Desire for [place] Talents/Skills Transportation  ADL's:  Intact  Cognition:  WNL  Sleep:   sleep better and sometimes groggy in am      Assessment and Plan: Bipolar disorder type I.  Anxiety.  I reviewed blood work results.  Patient is scheduled to have Abilify injection coming Wednesday.  After some discussion with the patient and her mother, we will resume going back to work part-time 4 hours a day starting Thursday, May 26.  However if she noticed her symptoms coming back and she is not able to function and then she will call us back immediately.  Patient like to go back  to live with her husband and agree if things do not go smoothly then she will call us back.  I recommend she can try but aware about relapsing symptoms.  Mother also agreed with the plan.  Follow up in 3 weeks and we will consider reducing her oral medication.  Discussed medication side effects and benefits.  Recommended to call us back if he has any question or any concern.  Follow-up in 3 weeks.  Patient is evaluated by phone session.  He is doing much better  Follow Up  Instructions:    I discussed the assessment and treatment plan with the patient. The patient was provided an opportunity to ask questions and all were answered. The patient agreed with the plan and demonstrated an understanding of the instructions.   The patient was advised to call back or seek an in-person evaluation if the symptoms worsen or if the condition fails to improve as anticipated.  I provided 18 minutes of non-face-to-face time during this encounter.   Kathlee Nations, MD

## 2020-10-26 ENCOUNTER — Telehealth: Payer: Self-pay | Admitting: Gastroenterology

## 2020-10-26 ENCOUNTER — Other Ambulatory Visit: Payer: Self-pay

## 2020-10-26 NOTE — Telephone Encounter (Signed)
Patient wants to scheduled a colonoscopy. Clinical staff will follow up with patient.

## 2020-10-27 ENCOUNTER — Encounter: Payer: Self-pay | Admitting: Family

## 2020-10-27 ENCOUNTER — Encounter (INDEPENDENT_AMBULATORY_CARE_PROVIDER_SITE_OTHER): Payer: Self-pay

## 2020-10-28 ENCOUNTER — Other Ambulatory Visit: Payer: Self-pay

## 2020-10-28 ENCOUNTER — Other Ambulatory Visit: Payer: Self-pay | Admitting: Family

## 2020-10-28 ENCOUNTER — Ambulatory Visit (INDEPENDENT_AMBULATORY_CARE_PROVIDER_SITE_OTHER): Payer: 59

## 2020-10-28 ENCOUNTER — Telehealth: Payer: Self-pay

## 2020-10-28 DIAGNOSIS — F310 Bipolar disorder, current episode hypomanic: Secondary | ICD-10-CM | POA: Diagnosis not present

## 2020-10-28 DIAGNOSIS — F3113 Bipolar disorder, current episode manic without psychotic features, severe: Secondary | ICD-10-CM | POA: Diagnosis not present

## 2020-10-28 DIAGNOSIS — M79672 Pain in left foot: Secondary | ICD-10-CM

## 2020-10-28 DIAGNOSIS — M79671 Pain in right foot: Secondary | ICD-10-CM

## 2020-10-28 MED ORDER — ARIPIPRAZOLE ER 400 MG IM PRSY
400.0000 mg | PREFILLED_SYRINGE | Freq: Once | INTRAMUSCULAR | Status: AC
  Administered 2020-10-28: 400 mg via INTRAMUSCULAR

## 2020-10-28 NOTE — Telephone Encounter (Signed)
pt came into the office today for her abilify injection. pt states she doing ok. she states that she doesnt feel much change but she stated her mood was good.  she stated that she has a issue with sleeping that she wake up and can not go back to sleep. I let patient know that I would send the doctor a message and let him know.  She appeared like she was doing good.  She made conversation had good eye contact. And she was clean and well dressed.

## 2020-10-28 NOTE — Patient Instructions (Signed)
Pt came into the office today to get her Abilify Manintena injection. She state is doing well with the injection as far as she can tell. She states she is having a hard time staying asleep. Pt was told that a message would be sent to Dr. Adele Schilder about her sleeping issues. Pt appeared to be good she was clean and cleanly dressed. She had good eye contact and had engaged in conversation . she states this is her 2nd dosage.   Injection was given in the Left Deltoid.  abilify maintena 400mg  NDC L3596575; Lot # [id number];  ex[ april 2024, SN# [id number];            GTIN# [id number]

## 2020-10-28 NOTE — Telephone Encounter (Signed)
She can try hydroxyzine 25 mg at bedtime for insomnia that can also help her anxiety.  If she agree please call her local pharmacy to take hydroxyzine 25 mg As Needed at Bedtime.

## 2020-10-29 ENCOUNTER — Other Ambulatory Visit: Payer: Self-pay

## 2020-10-29 DIAGNOSIS — Z1211 Encounter for screening for malignant neoplasm of colon: Secondary | ICD-10-CM

## 2020-10-29 DIAGNOSIS — D229 Melanocytic nevi, unspecified: Secondary | ICD-10-CM | POA: Diagnosis not present

## 2020-10-29 NOTE — Telephone Encounter (Signed)
Pt has been scheduled for screening colonoscopy at [facility] on 01/28/21.

## 2020-11-11 ENCOUNTER — Other Ambulatory Visit: Payer: Self-pay | Admitting: Podiatry

## 2020-11-11 ENCOUNTER — Ambulatory Visit (INDEPENDENT_AMBULATORY_CARE_PROVIDER_SITE_OTHER): Payer: 59

## 2020-11-11 ENCOUNTER — Ambulatory Visit (INDEPENDENT_AMBULATORY_CARE_PROVIDER_SITE_OTHER): Payer: 59 | Admitting: Podiatry

## 2020-11-11 ENCOUNTER — Encounter: Payer: Self-pay | Admitting: Podiatry

## 2020-11-11 ENCOUNTER — Other Ambulatory Visit: Payer: Self-pay

## 2020-11-11 DIAGNOSIS — M67472 Ganglion, left ankle and foot: Secondary | ICD-10-CM

## 2020-11-11 DIAGNOSIS — M778 Other enthesopathies, not elsewhere classified: Secondary | ICD-10-CM

## 2020-11-11 DIAGNOSIS — M67479 Ganglion, unspecified ankle and foot: Secondary | ICD-10-CM

## 2020-11-11 DIAGNOSIS — Q828 Other specified congenital malformations of skin: Secondary | ICD-10-CM

## 2020-11-11 MED ORDER — TRIAMCINOLONE ACETONIDE 40 MG/ML IJ SUSP
40.0000 mg | Freq: Once | INTRAMUSCULAR | Status: AC
  Administered 2020-11-11: 40 mg

## 2020-11-11 NOTE — Progress Notes (Signed)
Subjective:  Patient ID: [name], female    DOB: 10-05-1970,  MRN: [id number] HPI Chief Complaint  Patient presents with  . Foot Pain    Dorsal midfoot bilateral (R>L) - RIGHT - aching x 2 weeks, no injury, swollen, has been exercising more recently, tennis shoes are aggravating it, tried Ibuprofen, LEFT - cyst x 1 year, but not painful  . Callouses    Sub 5th MPJ left - callused area  . New Patient (Initial Visit)    50 y.o. female presents with the above complaint.   ROS: Denies fever chills nausea vomiting muscle aches pains calf pain back pain chest pain shortness of breath.  Past Medical History:  Diagnosis Date  . Akathisia 12/17/2014  . Bipolar affective ([place])   . Restless leg syndrome   . Schizoaffective disorder ([place])   . Schizoaffective disorder [facility])    Past Surgical History:  Procedure Laterality Date  . APPENDECTOMY    . CESAREAN SECTION    . LAPAROSCOPIC APPENDECTOMY N/A 07/02/2020   Procedure: APPENDECTOMY LAPAROSCOPIC;  Surgeon: Fredirick Maudlin, MD;  Location: [facility] ORS;  Service: General;  Laterality: N/A;    Current Outpatient Medications:  .  traZODone (DESYREL) 50 MG tablet, Take 50 mg by mouth at bedtime., Disp: , Rfl:  .  ARIPiprazole (ABILIFY) 20 MG tablet, Take 1 tablet (20 mg total) by mouth daily., Disp: 30 tablet, Rfl: 0 .  ARIPiprazole ER (ABILIFY MAINTENA) 400 MG PRSY prefilled syringe, Inject 400 mg into the muscle every 28 (twenty-eight) days., Disp: 1 each, Rfl: 1 .  divalproex (DEPAKOTE) 500 MG DR tablet, Take 2 tablets (1,000 mg total) by mouth every 12 (twelve) hours., Disp: 60 tablet, Rfl: 0  No Known Allergies Review of Systems Objective:  There were no vitals filed for this visit.  General: Well developed, nourished, in no acute distress, alert and oriented x3   Dermatological: Skin is warm, dry and supple bilateral. Nails x 10 are well maintained; remaining integument appears unremarkable at this time. There are no open sores,  no preulcerative lesions, no rash or signs of infection present.  She has a reactive hyperkeratotic lesions of the fifth metatarsal head of the left foot appears to be a poor keratoma as opposed to verrucoid lesion.  Vascular: Dorsalis Pedis artery and Posterior Tibial artery pedal pulses are 2/4 bilateral with immedate capillary fill time. Pedal hair growth present. No varicosities and no lower extremity edema present bilateral.   Neruologic: Grossly intact via light touch bilateral. Vibratory intact via tuning fork bilateral. Protective threshold with Semmes Wienstein monofilament intact to all pedal sites bilateral. Patellar and Achilles deep tendon reflexes 2+ bilateral. No Babinski or clonus noted bilateral.   Musculoskeletal: No gross boney pedal deformities bilateral. No pain, crepitus, or limitation noted with foot and ankle range of motion bilateral. Muscular strength 5/5 in all groups tested bilateral.  She has some osteoarthritic changes to the dorsal aspect of the right foot at the second and third metatarsal cuneiform joints.  This is resulting in some superficial ganglion cyst development dorsally.  Left foot demonstrates similar findings.  Gait: Unassisted, Nonantalgic.    Radiographs:  Radiographs taken today demonstrate an osseously mature individual joint space narrowing of the second third tarsometatarsal joints with some dorsal spurring right over left.  Soft tissue cyst thickness is worse on the left foot dorsally than on the right.  No acute findings are noted.  Assessment & Plan:   Assessment: Capsulitis dorsal aspect right foot ganglion  cyst right foot capsulitis left foot ganglion cyst dorsal left foot  Plan: Injected these areas today with Kenalog and local anesthetic a total of 10 mg was injected.  She tolerated procedure well without complications.  Follow-up with her on an as-needed basis.     Kaytee Taliercio T. [place], [place]

## 2020-11-13 ENCOUNTER — Encounter (HOSPITAL_COMMUNITY): Payer: Self-pay | Admitting: Psychiatry

## 2020-11-13 ENCOUNTER — Telehealth (INDEPENDENT_AMBULATORY_CARE_PROVIDER_SITE_OTHER): Payer: 59 | Admitting: Psychiatry

## 2020-11-13 ENCOUNTER — Other Ambulatory Visit: Payer: Self-pay

## 2020-11-13 DIAGNOSIS — F319 Bipolar disorder, unspecified: Secondary | ICD-10-CM

## 2020-11-13 DIAGNOSIS — F419 Anxiety disorder, unspecified: Secondary | ICD-10-CM | POA: Diagnosis not present

## 2020-11-13 MED ORDER — TRAZODONE HCL 50 MG PO TABS
50.0000 mg | ORAL_TABLET | Freq: Every evening | ORAL | 0 refills | Status: DC | PRN
  Filled 2020-11-13: qty 30, 30d supply, fill #0

## 2020-11-13 MED ORDER — ARIPIPRAZOLE ER 400 MG IM PRSY
400.0000 mg | PREFILLED_SYRINGE | INTRAMUSCULAR | 0 refills | Status: DC
  Filled 2020-11-13 – 2020-11-20 (×3): qty 1, 28d supply, fill #0

## 2020-11-13 MED ORDER — ARIPIPRAZOLE 10 MG PO TABS
10.0000 mg | ORAL_TABLET | Freq: Every day | ORAL | 0 refills | Status: DC
  Filled 2020-11-13 – 2020-11-24 (×2): qty 30, 30d supply, fill #0

## 2020-11-13 MED ORDER — DIVALPROEX SODIUM 500 MG PO DR TAB
500.0000 mg | DELAYED_RELEASE_TABLET | Freq: Every day | ORAL | 0 refills | Status: DC
  Filled 2020-11-13 – 2020-12-21 (×2): qty 30, 30d supply, fill #0

## 2020-11-13 NOTE — Progress Notes (Signed)
Virtual Visit via Video Note  I connected with Mercer Pod on 11/13/20 at 10:40 AM EDT by a video enabled telemedicine application and verified that I am speaking with the correct person using two identifiers.  Location: Patient: Work Provider: Biomedical scientist   I discussed the limitations of evaluation and management by telemedicine and the availability of in person appointments. The patient expressed understanding and agreed to proceed.  History of Present Illness: Patient is evaluated by review of session.  She started working part-time and so far things are going well and better for her.  She had a better relationship with her husband.  She feels she is ready to go back to full-time work.  She received injection on 25th and she feels her mood is stable but there are some time when she feels anxious, racing thoughts and poor sleep.  So far she is compliant with oral Abilify and Depakote.  She also had an opportunity to see a therapist twice through the work and if needed more session she can approach the therapist.  She denies any mania, impulsive behavior, hallucination, paranoia or any suicidal thoughts.  She feels parents are very supportive and now back to living in her own house with the husband who also make sure that I take the medicine on time.  So far she denies any issues with the medication reporting any tremors, shakes or any EPS.  She feels that if she was taking the Abilify injection in the past she will not be admitted in the hospital.    Past Psychiatric History: Reviewed H/O bipolar disorder and PTSD. H/O multiple inpatient at [facility] and [facility]. Last inpatient May 2022. H/O mania, psychosis, suicidal attempt with overdose. Tried olanzapine, lithium, Trilafon, carbamazepine, amantadine and Klonopin.  In the past she saw Dr. Gretel Acre, Dr. Valeda Malm and Dr. Jake Michaelis in [place].  Psychiatric Specialty Exam: Physical Exam  Review of Systems  Weight 217 lb (98.4 kg).There is no height or  weight on file to calculate BMI.  General Appearance: Casual  Eye Contact:  Fair  Speech:  Normal Rate  Volume:  Normal  Mood:  Anxious and emotional  Affect:  Labile  Thought Process:  Descriptions of Associations: Circumstantial  Orientation:  Full (Time, Place, and Person)  Thought Content:  Rumination  Suicidal Thoughts:  No  Homicidal Thoughts:  No  Memory:  Immediate;   Fair Recent;   Fair Remote;   Fair  Judgement:  Fair  Insight:  Shallow  Psychomotor Activity:  Normal  Concentration:  Concentration: Fair and Attention Span: Fair  Recall:  [employer] of Knowledge:  Fair  Language:  Good  Akathisia:  No  Handed:  Right  AIMS (if indicated):     Assets:  Communication Skills Desire for Improvement Housing Social Support Transportation  ADL's:  Intact  Cognition:  WNL  Sleep:   fair      Assessment and Plan: Bipolar disorder type I.  Anxiety.  I reviewed current medication.  Patient received first Abilify injection on May 25 and so far no major issues.  She feels sometimes groggy during the day and racing thoughts at night.  She like to go back full-time which I agree as patient making progress.  Discussed healthy lifestyle and watch her calorie intake.  Encourage if needed to see a therapist which is available if she needed through the work.  I will also cut down her medication as patient received injection last month.  We will reduce Abilify  overall from 20 mg to 10 mg and Depakote from 500 mg twice a day to 500 mg only at bedtime.  Patient like to resume trazodone if needed for insomnia which I agree.  Patient will get a second injection of Abilify later this month.  Discussed medication side effects and benefits.  Patient requires letter so she can resume full-time starting Monday.  I recommend to call us back if she has any question or any concern.  Follow-up in 4 weeks.  Follow Up Instructions:    I discussed the assessment and treatment plan with the patient.  The patient was provided an opportunity to ask questions and all were answered. The patient agreed with the plan and demonstrated an understanding of the instructions.   The patient was advised to call back or seek an in-person evaluation if the symptoms worsen or if the condition fails to improve as anticipated.  I provided 32 minutes of non-face-to-face time during this encounter.   Kathlee Nations, MD

## 2020-11-16 ENCOUNTER — Encounter (HOSPITAL_COMMUNITY): Payer: Self-pay

## 2020-11-19 ENCOUNTER — Telehealth: Payer: Self-pay | Admitting: Family

## 2020-11-19 NOTE — Telephone Encounter (Signed)
Rejection Reason - Patient Declined - patient schedule appointment then canceled" Vinie Sill said on Nov 19, 2020 3:47 PM  Pt appt was on 11/19/2020 at 02:00 pm  Msg from [facility] ob/gyn

## 2020-11-20 ENCOUNTER — Other Ambulatory Visit: Payer: Self-pay

## 2020-11-23 ENCOUNTER — Other Ambulatory Visit: Payer: Self-pay

## 2020-11-24 ENCOUNTER — Other Ambulatory Visit: Payer: Self-pay

## 2020-11-25 ENCOUNTER — Other Ambulatory Visit: Payer: Self-pay

## 2020-11-25 ENCOUNTER — Ambulatory Visit (INDEPENDENT_AMBULATORY_CARE_PROVIDER_SITE_OTHER): Payer: 59

## 2020-11-25 ENCOUNTER — Other Ambulatory Visit (HOSPITAL_COMMUNITY): Payer: Self-pay

## 2020-11-25 DIAGNOSIS — F3113 Bipolar disorder, current episode manic without psychotic features, severe: Secondary | ICD-10-CM

## 2020-11-25 DIAGNOSIS — F310 Bipolar disorder, current episode hypomanic: Secondary | ICD-10-CM | POA: Diagnosis not present

## 2020-11-25 MED ORDER — ARIPIPRAZOLE ER 400 MG IM PRSY
400.0000 mg | PREFILLED_SYRINGE | Freq: Once | INTRAMUSCULAR | Status: AC
  Administered 2020-11-25: 400 mg via INTRAMUSCULAR

## 2020-11-25 NOTE — Patient Instructions (Signed)
Pt came into the office today to get her Abilify Manintena injection. She state is doing well with the injection as far as she can tell. S Pt appeared to be good she was clean and cleanly dressed. She had good eye contact and had engaged in conversation .     Injection was given in the Rightt Deltoid.  abilify maintena 400mg  NDC L3596575; Lot # [id number];  exp aug 2024, SN# [id number];            GTIN# [id number]

## 2020-11-27 ENCOUNTER — Other Ambulatory Visit: Payer: Self-pay

## 2020-12-18 ENCOUNTER — Other Ambulatory Visit: Payer: Self-pay

## 2020-12-18 ENCOUNTER — Ambulatory Visit (INDEPENDENT_AMBULATORY_CARE_PROVIDER_SITE_OTHER): Payer: 59 | Admitting: Family

## 2020-12-18 ENCOUNTER — Encounter: Payer: Self-pay | Admitting: Family

## 2020-12-18 VITALS — BP 112/62 | HR 97 | Temp 98.8°F | Ht 62.21 in | Wt 215.8 lb

## 2020-12-18 DIAGNOSIS — F3113 Bipolar disorder, current episode manic without psychotic features, severe: Secondary | ICD-10-CM | POA: Diagnosis not present

## 2020-12-18 DIAGNOSIS — R7303 Prediabetes: Secondary | ICD-10-CM

## 2020-12-18 DIAGNOSIS — M542 Cervicalgia: Secondary | ICD-10-CM

## 2020-12-18 DIAGNOSIS — Z1231 Encounter for screening mammogram for malignant neoplasm of breast: Secondary | ICD-10-CM

## 2020-12-18 MED ORDER — METFORMIN HCL ER 500 MG PO TB24
ORAL_TABLET | ORAL | 3 refills | Status: DC
  Filled 2020-12-18: qty 90, 90d supply, fill #0

## 2020-12-18 NOTE — Patient Instructions (Addendum)
CT head and neck  Let us know if you dont hear back within a week in regards to an appointment being scheduled.    Please call  and schedule your 3D mammogram as discussed.   [name]  [place], [place]   Start metformin  Lets trial metformin  Start metformin XR with one 500mg  tablet at night. After one week, you may increase to two tablets at night ( total of 1000mg ) . The third week, you may take take two tablets at night and one tablet in the morning.  The fourth week, you may take two tablets in the morning ( 1000mg  total) and two tablets at night (1000mg  total). This will bring you to a maximum daily dose of 2000mg /day which is maximum dose. Along the way, if you want to increase more slowly, please do as this medication can cause GI discomfort and loose stools which usually get better with time , however some patients find that they can only tolerate a certain dose and cannot increase to maximum dose.

## 2020-12-18 NOTE — Progress Notes (Signed)
Subjective:    Patient ID: [name], female    DOB: [birth year], 50 y.o.   MRN: [id number]  CC: [name] is a 50 y.o. female who presents today for follow up.   HPI: She is concerned with gaining  weight these past several months. Interested in medication for weight loss.    Hungry the time. Eating healthy. No soda.  Prediabetes  Left sided pain over left collar bone, 6 weeks, comes and goes.   Hasnt tried any medication for this.  Can feel pain when holding arms up driving. No pain when getting dressed, brushing.  No left shoulder pain, left arm pain.   No injury, fever, cough, swelling, rash, trouble swallowing.   Restarted trazodone 50mg  prn. Compliant with ability 400mg  IM,  ability 10mg  qd.   Following with Dr Adele Schilder for bipolar 1, anxiety. She is compliant with abilify   Recent follow up with Dr Adele Schilder last month  Hospitalized 10/09/20 for agitation, and admitted for observation at that time.    HISTORY:  Past Medical History:  Diagnosis Date   Akathisia 12/17/2014   Bipolar affective ([place])    Restless leg syndrome    Schizoaffective disorder ([place])    Schizoaffective disorder ([place])    Past Surgical History:  Procedure Laterality Date   APPENDECTOMY     CESAREAN SECTION     LAPAROSCOPIC APPENDECTOMY N/A 07/02/2020   Procedure: APPENDECTOMY LAPAROSCOPIC;  Surgeon: Fredirick Maudlin, MD;  Location: [facility] ORS;  Service: General;  Laterality: N/A;   Family History  Problem Relation Age of Onset   Drug abuse Maternal Aunt     Allergies: Patient has no known allergies. Current Outpatient Medications on File Prior to Visit  Medication Sig Dispense Refill   ARIPiprazole (ABILIFY) 10 MG tablet Take 1 tablet (10 mg total) by mouth daily. 30 tablet 0   ARIPiprazole ER (ABILIFY MAINTENA) 400 MG PRSY prefilled syringe Inject 400 mg into the muscle every 28 (twenty-eight) days. 1 each 0   divalproex (DEPAKOTE) 500 MG DR tablet Take 1 tablet (500 mg total) by  mouth at bedtime. 30 tablet 0   traZODone (DESYREL) 50 MG tablet Take 1 tablet (50 mg total) by mouth at bedtime as needed for sleep. 30 tablet 0   No current facility-administered medications on file prior to visit.    Social History   Tobacco Use   Smoking status: Every Day    Packs/day: 0.50    Years: 10.00    Pack years: 5.00    Types: Cigarettes   Smokeless tobacco: Never  Vaping Use   Vaping Use: Former  Substance Use Topics   Alcohol use: No   Drug use: No    Review of Systems  Constitutional:  Negative for chills and fever.  HENT:  Negative for trouble swallowing.   Respiratory:  Negative for cough.   Cardiovascular:  Negative for chest pain and palpitations.  Gastrointestinal:  Negative for nausea and vomiting.  Musculoskeletal:  Positive for neck pain.  Skin:  Negative for rash.  Psychiatric/Behavioral:  Negative for sleep disturbance and suicidal ideas. The patient is not nervous/anxious.      Objective:    BP 112/62 (BP Location: Left Arm, Patient Position: Sitting, Cuff Size: Large)   Pulse 97   Temp 98.8 F (37.1 C) (Oral)   Ht 5' 2.21" (1.58 m)   Wt 215 lb 12.8 oz (97.9 kg)   SpO2 97%   BMI 39.20 kg/m  BP Readings from Last  3 Encounters:  12/18/20 112/62  11/25/20 116/74  10/28/20 135/83   Wt Readings from Last 3 Encounters:  12/18/20 215 lb 12.8 oz (97.9 kg)  11/25/20 211 lb (95.7 kg)  11/13/20 217 lb (98.4 kg)    Physical Exam Vitals reviewed.  Constitutional:      Appearance: She is well-developed.  HENT:     Head:      Comments: Left medial side of neck base of neck tenderness. No palpable mass, asymmetry.  Eyes:     Conjunctiva/sclera: Conjunctivae normal.  Neck:     Thyroid: No thyroid mass or thyromegaly.  Cardiovascular:     Rate and Rhythm: Normal rate and regular rhythm.     Pulses: Normal pulses.     Heart sounds: Normal heart sounds.  Pulmonary:     Effort: Pulmonary effort is normal.     Breath sounds: Normal breath  sounds. No wheezing, rhonchi or rales.  Musculoskeletal:     Right shoulder: No swelling, tenderness or bony tenderness.     Left shoulder: No swelling, tenderness or bony tenderness.     Cervical back: No pain with movement, spinous process tenderness or muscular tenderness. Normal range of motion.     Comments: Full ROM of bilateral shoulders. No pain with resisted lateral extension.   Lymphadenopathy:     Head:     Right side of head: No submental, submandibular, tonsillar, preauricular, posterior auricular or occipital adenopathy.     Left side of head: No submental, submandibular, tonsillar, preauricular, posterior auricular or occipital adenopathy.     Cervical: No cervical adenopathy.  Skin:    General: Skin is warm and dry.  Neurological:     Mental Status: She is alert.  Psychiatric:        Speech: Speech normal.        Behavior: Behavior normal.        Thought Content: Thought content normal.       Assessment & Plan:   Problem List Items Addressed This Visit       Other   Bipolar affective disorder, current episode manic (HCC)    Chronic stable. Continue regimen as prescribed by Dr Adele Schilder       Neck pain    Etiology unclear. No identifiable lymphadenopathy. She is a smoker. Pending Ct soft tissue neck. If unrevealing and patient continue to experience pain, will arrange ENT consult.        Relevant Orders   CBC with Differential/Platelet   CT Soft Tissue Neck W Contrast   Prediabetes - Primary    Pending updating a1c. Start metformin 500mg .        Relevant Medications   metFORMIN (GLUCOPHAGE XR) 500 MG 24 hr tablet   Other Relevant Orders   Comprehensive metabolic panel   Hemoglobin A1c   TSH   MM 3D SCREEN BREAST BILATERAL   CBC with Differential/Platelet   VITAMIN D 25 Hydroxy (Vit-D Deficiency, Fractures)   Lipid panel   Other Visit Diagnoses     Encounter for screening mammogram for malignant neoplasm of breast       Relevant Orders   MM 3D  SCREEN BREAST BILATERAL        I am having Samella P. Hershey start on metFORMIN. I am also having her maintain her traZODone, divalproex, ARIPiprazole, and ARIPiprazole ER.   Meds ordered this encounter  Medications   metFORMIN (GLUCOPHAGE XR) 500 MG 24 hr tablet    Sig: Start 500mg  PO qpm.  Dispense:  90 tablet    Refill:  3    Order Specific Question:   Supervising Provider    Answer:   Crecencio Mc [[id number]]    Return precautions given.   Risks, benefits, and alternatives of the medications and treatment plan prescribed today were discussed, and patient expressed understanding.   Education regarding symptom management and diagnosis given to patient on AVS.  Continue to follow with Burnard Hawthorne, FNP for routine health maintenance.   [name] and I agreed with plan.   Mable Paris, FNP

## 2020-12-18 NOTE — Assessment & Plan Note (Signed)
Pending updating a1c. Start metformin 500mg .

## 2020-12-18 NOTE — Assessment & Plan Note (Signed)
Chronic stable. Continue regimen as prescribed by Dr Adele Schilder

## 2020-12-18 NOTE — Assessment & Plan Note (Signed)
Etiology unclear. No identifiable lymphadenopathy. She is a smoker. Pending Ct soft tissue neck. If unrevealing and patient continue to experience pain, will arrange ENT consult.

## 2020-12-21 ENCOUNTER — Other Ambulatory Visit: Payer: Self-pay

## 2020-12-21 ENCOUNTER — Telehealth: Payer: Self-pay | Admitting: Family

## 2020-12-21 NOTE — Telephone Encounter (Signed)
lft pt vm to call ofc to sch CT. thanks

## 2020-12-22 ENCOUNTER — Other Ambulatory Visit: Payer: Self-pay

## 2020-12-22 ENCOUNTER — Telehealth (INDEPENDENT_AMBULATORY_CARE_PROVIDER_SITE_OTHER): Payer: 59 | Admitting: Psychiatry

## 2020-12-22 ENCOUNTER — Encounter (HOSPITAL_COMMUNITY): Payer: Self-pay

## 2020-12-22 ENCOUNTER — Encounter (HOSPITAL_COMMUNITY): Payer: Self-pay | Admitting: Psychiatry

## 2020-12-22 DIAGNOSIS — F419 Anxiety disorder, unspecified: Secondary | ICD-10-CM

## 2020-12-22 DIAGNOSIS — F319 Bipolar disorder, unspecified: Secondary | ICD-10-CM | POA: Diagnosis not present

## 2020-12-22 MED ORDER — ARIPIPRAZOLE ER 400 MG IM PRSY
400.0000 mg | PREFILLED_SYRINGE | INTRAMUSCULAR | 0 refills | Status: DC
  Filled 2020-12-22: qty 1, 28d supply, fill #0

## 2020-12-22 MED ORDER — DIVALPROEX SODIUM 500 MG PO DR TAB
500.0000 mg | DELAYED_RELEASE_TABLET | Freq: Every day | ORAL | 0 refills | Status: DC
  Filled 2020-12-22 (×2): qty 30, 30d supply, fill #0

## 2020-12-22 MED ORDER — ARIPIPRAZOLE 5 MG PO TABS
5.0000 mg | ORAL_TABLET | Freq: Every day | ORAL | 0 refills | Status: DC
  Filled 2020-12-22: qty 30, 30d supply, fill #0

## 2020-12-22 NOTE — Progress Notes (Signed)
Virtual Visit via Telephone Note  I connected with [name] on 12/22/20 at  9:00 AM EDT by telephone and verified that I am speaking with the correct person using two identifiers.  Location: Patient: Work Provider: Biomedical scientist   I discussed the limitations, risks, security and privacy concerns of performing an evaluation and management service by telephone and the availability of in person appointments. I also discussed with the patient that there may be a patient responsible charge related to this service. The patient expressed understanding and agreed to proceed.   History of Present Illness: Patient is evaluated by phone session.  Patient was not able to connect for review of session.  Overall she feels things are going well.  She is sleeping better and does not need trazodone every night.  She is scheduled to have injection of Abilify tomorrow.  She still have some time emotions and crying spells which she described for no reason but overall she feels her mood is much better.  She denies any impulsivity, mania, anger, severe mood swings or any suicidal thoughts.  She admitted not happy with her husband because he promised to renovate the kitchen which has not done in a while.  She started therapy with [name] but has not had appointment in more than 4 weeks but like to be scheduled soon.  We have cut down her Abilify and Depakote on the last visit and she feels much better.  She is not as groggy and does not require trazodone every night.  She denies any highs and lows.  She recently started metformin prescribed by her PCP to lose weight.  She is also scheduled to have blood work including Depakote level, hemoglobin A1c.  She feels overall her anxiety is much better and recently had a visit with her old friends and she was surprised it did go very well.  She was not anxious and felt more calm and relaxed.  She has no tremors, shakes or any EPS.  She denies any paranoia or any  homicidal thoughts.  She had supportive parents and husband.  Past Psychiatric History: Reviewed H/O bipolar disorder and PTSD. H/O multiple inpatient at [facility]. Last inpatient May 2022. H/O mania, psychosis, suicidal attempt with overdose. Tried olanzapine, lithium, Trilafon, carbamazepine, amantadine and Klonopin.  In the past she saw Dr. Gretel Acre, Dr. Valeda Malm and Dr. Jake Michaelis in [place].   Psychiatric Specialty Exam: Physical Exam  Review of Systems  Weight 215 lb (97.5 kg).There is no height or weight on file to calculate BMI.  General Appearance: NA  Eye Contact:  NA  Speech:  Normal Rate  Volume:  Normal  Mood:  Anxious and emotional  Affect:  NA  Thought Process:  Descriptions of Associations: Intact  Orientation:  Full (Time, Place, and Person)  Thought Content:  Rumination  Suicidal Thoughts:  No  Homicidal Thoughts:  No  Memory:  Immediate;   Good Recent;   Good Remote;   Fair  Judgement:  Fair  Insight:  Present  Psychomotor Activity:  NA  Concentration:  Concentration: Fair and Attention Span: Fair  Recall:  [employer] of Knowledge:  Good  Language:  Good  Akathisia:  No  Handed:  Right  AIMS (if indicated):     Assets:  Communication Skills Desire for Improvement Housing Social Support  ADL's:  Intact  Cognition:  WNL  Sleep:   improved      Assessment and Plan: Bipolar disorder type I.  Anxiety.  Patient doing much better and is scheduled to have Abilify injection tomorrow.  This will be her third injection since she started Abilify injection.  We discussed cutting down further Abilify from 10mg  to 5 mg but keep the Depakote 500 mg only at bedtime.  We will review her blood work with his schedule in few days.  Discussed medication side effects and benefits.  Encouraged to keep appointment with [name] at least once a week.  Patient is working full-time and so far she has no major issues.  Continue Abilify injection 400 mg, school every 4  weeks, reduce Abilify 5 mg and keep the Depakote 500 mg at bedtime.  She occasionally takes trazodone and does not need a new refill.  Recommended to call us back if she has any question or any concern.  Follow-up in 4 weeks.  Follow Up Instructions:    I discussed the assessment and treatment plan with the patient. The patient was provided an opportunity to ask questions and all were answered. The patient agreed with the plan and demonstrated an understanding of the instructions.   The patient was advised to call back or seek an in-person evaluation if the symptoms worsen or if the condition fails to improve as anticipated.  I provided 19 minutes of non-face-to-face time during this encounter.   Kathlee Nations, MD

## 2020-12-23 ENCOUNTER — Other Ambulatory Visit: Payer: Self-pay

## 2020-12-23 ENCOUNTER — Ambulatory Visit: Payer: 59

## 2020-12-28 ENCOUNTER — Other Ambulatory Visit: Payer: Self-pay

## 2020-12-28 ENCOUNTER — Ambulatory Visit (INDEPENDENT_AMBULATORY_CARE_PROVIDER_SITE_OTHER): Payer: 59

## 2020-12-28 DIAGNOSIS — F3113 Bipolar disorder, current episode manic without psychotic features, severe: Secondary | ICD-10-CM | POA: Diagnosis not present

## 2020-12-28 MED ORDER — ARIPIPRAZOLE ER 400 MG IM PRSY
400.0000 mg | PREFILLED_SYRINGE | Freq: Once | INTRAMUSCULAR | Status: AC
  Administered 2020-12-28: 400 mg via INTRAMUSCULAR

## 2020-12-28 NOTE — Patient Instructions (Addendum)
Pt came into the office today to get her Abilify Manintena injection. She state is doing well with the injection as far as she can tell. S Pt appeared to be good she was clean and cleanly dressed. She had good eye contact and had engaged in conversation .    Injection was given in the Left Deltoid.  abilify maintena '400mg'$  NDC D4123795; Lot # [id number];  exp Sept 2024, SN# [id number];            GTIN# [id number]  Return in 4 weeks for next injection.

## 2020-12-28 NOTE — Progress Notes (Signed)
Pt came into the office today to get her Abilify Manintena injection. She state is doing well with the injection as far as she can tell. S Pt appeared to be good she was clean and cleanly dressed. She had good eye contact and had engaged in conversation .    Injection was given in the Left Deltoid.  abilify maintena '400mg'$  NDC D4123795; Lot # [id number];  exp Sept 2024, SN# [id number];            GTIN# [id number]  Return in 4 weeks for next injection.

## 2020-12-30 ENCOUNTER — Other Ambulatory Visit: Payer: Self-pay

## 2020-12-30 DIAGNOSIS — Z1211 Encounter for screening for malignant neoplasm of colon: Secondary | ICD-10-CM

## 2021-01-02 ENCOUNTER — Encounter: Payer: Self-pay | Admitting: Family

## 2021-01-02 ENCOUNTER — Other Ambulatory Visit
Admission: RE | Admit: 2021-01-02 | Discharge: 2021-01-02 | Disposition: A | Discharge status: Home / Self Care | Payer: 59 | Source: Ambulatory Visit | Attending: Family | Admitting: Family

## 2021-01-02 DIAGNOSIS — M542 Cervicalgia: Secondary | ICD-10-CM | POA: Diagnosis not present

## 2021-01-02 DIAGNOSIS — F3113 Bipolar disorder, current episode manic without psychotic features, severe: Secondary | ICD-10-CM | POA: Insufficient documentation

## 2021-01-02 DIAGNOSIS — R7303 Prediabetes: Secondary | ICD-10-CM | POA: Insufficient documentation

## 2021-01-02 LAB — COMPREHENSIVE METABOLIC PANEL
ALT: 31 U/L (ref 0–44)
AST: 22 U/L (ref 15–41)
Albumin: 3.8 g/dL (ref 3.5–5.0)
Alkaline Phosphatase: 69 U/L (ref 38–126)
Anion gap: 9 (ref 5–15)
BUN: 26 mg/dL — ABNORMAL HIGH (ref 6–20)
CO2: 26 mmol/L (ref 22–32)
Calcium: 9.5 mg/dL (ref 8.9–10.3)
Chloride: 106 mmol/L (ref 98–111)
Creatinine, Ser: 0.76 mg/dL (ref 0.44–1.00)
GFR, Estimated: >60 mL/min (ref >60)
Glucose, Bld: 103 mg/dL — ABNORMAL HIGH (ref 70–99)
Potassium: 4.8 mmol/L (ref 3.5–5.1)
Sodium: 141 mmol/L (ref 135–145)
Total Bilirubin: 0.5 mg/dL (ref 0.3–1.2)
Total Protein: 7.1 g/dL (ref 6.5–8.1)

## 2021-01-02 LAB — CBC WITH DIFFERENTIAL/PLATELET
Abs Immature Granulocytes: 0.02 10*3/uL (ref 0.00–0.07)
Basophils Absolute: 0.1 10*3/uL (ref 0.0–0.1)
Basophils Relative: 1 %
Eosinophils Absolute: 0.2 10*3/uL (ref 0.0–0.5)
Eosinophils Relative: 3 %
HCT: 41.1 % (ref 36.0–46.0)
Hemoglobin: 13.4 g/dL (ref 12.0–15.0)
Immature Granulocytes: 0 %
Lymphocytes Relative: 39 %
Lymphs Abs: 3.2 10*3/uL (ref 0.7–4.0)
MCH: 30 pg (ref 26.0–34.0)
MCHC: 32.6 g/dL (ref 30.0–36.0)
MCV: 91.9 fL (ref 80.0–100.0)
Monocytes Absolute: 0.7 10*3/uL (ref 0.1–1.0)
Monocytes Relative: 9 %
Neutro Abs: 3.9 10*3/uL (ref 1.7–7.7)
Neutrophils Relative %: 48 %
Platelets: 304 10*3/uL (ref 150–400)
RBC: 4.47 MIL/uL (ref 3.87–5.11)
RDW: 13.9 % (ref 11.5–15.5)
Smear Review: NORMAL
WBC: 8.1 10*3/uL (ref 4.0–10.5)
nRBC: 0 % (ref 0.0–0.2)

## 2021-01-02 LAB — VITAMIN D 25 HYDROXY (VIT D DEFICIENCY, FRACTURES): Vit D, 25-Hydroxy: 23.23 ng/mL — ABNORMAL LOW (ref 30–100)

## 2021-01-02 LAB — LIPID PANEL
Cholesterol: 190 mg/dL (ref 0–200)
HDL: 44 mg/dL (ref >40)
LDL Cholesterol: 110 mg/dL — ABNORMAL HIGH (ref 0–99)
Total CHOL/HDL Ratio: 4.3 RATIO
Triglycerides: 179 mg/dL — ABNORMAL HIGH (ref <150)
VLDL: 36 mg/dL (ref 0–40)

## 2021-01-02 LAB — TSH: TSH: 2.506 u[IU]/mL (ref 0.350–4.500)

## 2021-01-04 LAB — HEMOGLOBIN A1C
Hgb A1c MFr Bld: 5.8 % — ABNORMAL HIGH (ref 4.8–5.6)
Mean Plasma Glucose: 120 mg/dL

## 2021-01-04 NOTE — Telephone Encounter (Signed)
Please advise on CT. A1C was already ordered

## 2021-01-04 NOTE — Telephone Encounter (Signed)
Noted  

## 2021-01-04 NOTE — Telephone Encounter (Signed)
Patient called, she wants Judson Roch to know to disregard MyChart message.

## 2021-01-06 ENCOUNTER — Ambulatory Visit
Admission: RE | Admit: 2021-01-06 | Discharge: 2021-01-06 | Disposition: A | Discharge status: Home / Self Care | Payer: 59 | Source: Ambulatory Visit | Attending: Family | Admitting: Family

## 2021-01-06 ENCOUNTER — Other Ambulatory Visit: Payer: Self-pay

## 2021-01-06 DIAGNOSIS — M542 Cervicalgia: Secondary | ICD-10-CM | POA: Insufficient documentation

## 2021-01-06 DIAGNOSIS — R221 Localized swelling, mass and lump, neck: Secondary | ICD-10-CM | POA: Diagnosis not present

## 2021-01-06 MED ORDER — IOHEXOL 350 MG/ML SOLN
75.0000 mL | Freq: Once | INTRAVENOUS | Status: AC | PRN
  Administered 2021-01-06: 75 mL via INTRAVENOUS

## 2021-01-08 ENCOUNTER — Other Ambulatory Visit: Payer: Self-pay

## 2021-01-08 ENCOUNTER — Ambulatory Visit
Admission: RE | Admit: 2021-01-08 | Discharge: 2021-01-08 | Disposition: A | Discharge status: Home / Self Care | Payer: 59 | Source: Ambulatory Visit | Attending: Family | Admitting: Family

## 2021-01-08 DIAGNOSIS — Z1231 Encounter for screening mammogram for malignant neoplasm of breast: Secondary | ICD-10-CM | POA: Diagnosis not present

## 2021-01-08 DIAGNOSIS — R7303 Prediabetes: Secondary | ICD-10-CM | POA: Insufficient documentation

## 2021-01-15 ENCOUNTER — Inpatient Hospital Stay
Admission: RE | Admit: 2021-01-15 | Discharge: 2021-01-15 | Disposition: A | Discharge status: Home / Self Care | Payer: Self-pay | Source: Ambulatory Visit | Attending: *Deleted | Admitting: *Deleted

## 2021-01-15 ENCOUNTER — Other Ambulatory Visit: Payer: Self-pay | Admitting: *Deleted

## 2021-01-15 DIAGNOSIS — Z1231 Encounter for screening mammogram for malignant neoplasm of breast: Secondary | ICD-10-CM

## 2021-01-18 ENCOUNTER — Other Ambulatory Visit: Payer: Self-pay | Admitting: Family

## 2021-01-18 ENCOUNTER — Other Ambulatory Visit: Payer: Self-pay

## 2021-01-18 DIAGNOSIS — N6310 Unspecified lump in the right breast, unspecified quadrant: Secondary | ICD-10-CM

## 2021-01-18 DIAGNOSIS — R928 Other abnormal and inconclusive findings on diagnostic imaging of breast: Secondary | ICD-10-CM

## 2021-01-18 DIAGNOSIS — N6320 Unspecified lump in the left breast, unspecified quadrant: Secondary | ICD-10-CM

## 2021-01-18 MED ORDER — CLENPIQ 10-3.5-12 MG-GM -GM/160ML PO SOLN
320.0000 mL | ORAL | 0 refills | Status: DC
  Filled 2021-01-18: qty 320, 1d supply, fill #0

## 2021-01-19 ENCOUNTER — Other Ambulatory Visit: Payer: Self-pay

## 2021-01-25 ENCOUNTER — Encounter: Payer: Self-pay | Admitting: *Deleted

## 2021-01-26 ENCOUNTER — Other Ambulatory Visit (HOSPITAL_COMMUNITY): Payer: Self-pay | Admitting: Psychiatry

## 2021-01-26 ENCOUNTER — Ambulatory Visit: Payer: 59 | Admitting: Psychiatry

## 2021-01-26 ENCOUNTER — Ambulatory Visit: Payer: 59

## 2021-01-26 ENCOUNTER — Other Ambulatory Visit: Payer: Self-pay

## 2021-01-26 DIAGNOSIS — F419 Anxiety disorder, unspecified: Secondary | ICD-10-CM

## 2021-01-26 DIAGNOSIS — F319 Bipolar disorder, unspecified: Secondary | ICD-10-CM

## 2021-01-27 ENCOUNTER — Ambulatory Visit (INDEPENDENT_AMBULATORY_CARE_PROVIDER_SITE_OTHER): Payer: 59 | Admitting: Family

## 2021-01-27 ENCOUNTER — Ambulatory Visit
Admission: RE | Admit: 2021-01-27 | Discharge: 2021-01-27 | Disposition: A | Discharge status: Home / Self Care | Payer: 59 | Source: Ambulatory Visit | Attending: Family | Admitting: Family

## 2021-01-27 ENCOUNTER — Encounter: Payer: Self-pay | Admitting: Family

## 2021-01-27 ENCOUNTER — Other Ambulatory Visit: Payer: Self-pay

## 2021-01-27 ENCOUNTER — Other Ambulatory Visit (HOSPITAL_COMMUNITY): Payer: Self-pay | Admitting: Psychiatry

## 2021-01-27 ENCOUNTER — Telehealth (HOSPITAL_COMMUNITY): Payer: Self-pay

## 2021-01-27 VITALS — BP 122/80 | HR 83 | Ht 62.21 in | Wt 217.8 lb

## 2021-01-27 DIAGNOSIS — R918 Other nonspecific abnormal finding of lung field: Secondary | ICD-10-CM | POA: Diagnosis not present

## 2021-01-27 DIAGNOSIS — N6320 Unspecified lump in the left breast, unspecified quadrant: Secondary | ICD-10-CM

## 2021-01-27 DIAGNOSIS — N6011 Diffuse cystic mastopathy of right breast: Secondary | ICD-10-CM | POA: Diagnosis not present

## 2021-01-27 DIAGNOSIS — Z23 Encounter for immunization: Secondary | ICD-10-CM | POA: Diagnosis not present

## 2021-01-27 DIAGNOSIS — Z0000 Encounter for general adult medical examination without abnormal findings: Secondary | ICD-10-CM

## 2021-01-27 DIAGNOSIS — R928 Other abnormal and inconclusive findings on diagnostic imaging of breast: Secondary | ICD-10-CM

## 2021-01-27 DIAGNOSIS — F3113 Bipolar disorder, current episode manic without psychotic features, severe: Secondary | ICD-10-CM | POA: Diagnosis not present

## 2021-01-27 DIAGNOSIS — N6310 Unspecified lump in the right breast, unspecified quadrant: Secondary | ICD-10-CM

## 2021-01-27 DIAGNOSIS — F319 Bipolar disorder, unspecified: Secondary | ICD-10-CM

## 2021-01-27 DIAGNOSIS — N6324 Unspecified lump in the left breast, lower inner quadrant: Secondary | ICD-10-CM | POA: Diagnosis not present

## 2021-01-27 DIAGNOSIS — N6323 Unspecified lump in the left breast, lower outer quadrant: Secondary | ICD-10-CM | POA: Diagnosis not present

## 2021-01-27 DIAGNOSIS — F419 Anxiety disorder, unspecified: Secondary | ICD-10-CM

## 2021-01-27 MED FILL — Aripiprazole IM For ER Susp Prefilled Syringe 400 MG: INTRAMUSCULAR | 28 days supply | Qty: 1 | Fill #0 | Status: AC

## 2021-01-27 NOTE — Assessment & Plan Note (Signed)
Patient declines clinical breast exam as mammogram is up-to-date.  Will await results.  She also declines Pap smear which is due but she prefers to follow with GYN and she has been established with [facility] in the past.  She will call them to reschedule.  Colonoscopy tomorrow.

## 2021-01-27 NOTE — Telephone Encounter (Signed)
Spoke with the pharmacist at [facility] on [address] and they stated that another script needs to be sent in for this patient's Abilify Maintena '400mg'$  prefilled syringe before she can get her injection. Please review and advise. Thank you

## 2021-01-27 NOTE — Assessment & Plan Note (Signed)
Chronic, stable.  Patient will continue to follow with psychiatry.  Continue current regimen

## 2021-01-27 NOTE — Patient Instructions (Signed)
Please ensure that you do follow up with GYN to have your Pap smear done.    Nice to see you!  Health Maintenance for Postmenopausal Women Menopause is a normal process in which your ability to get pregnant comes to an end. This process happens slowly over many months or years, usually between the ages of 47 and 46. Menopause is complete when you have missed your menstrualperiods for 12 months. It is important to talk with your health care provider about some of the most common conditions that affect women after menopause (postmenopausal women). These include heart disease, cancer, and bone loss (osteoporosis). Adopting a healthy lifestyle and getting preventive care can help to promote your health and wellness. The actions you take can also lower your chances ofdeveloping some of these common conditions. What should I know about menopause? During menopause, you may get a number of symptoms, such as: Hot flashes. These can be moderate or severe. Night sweats. Decrease in sex drive. Mood swings. Headaches. Tiredness. Irritability. Memory problems. Insomnia. Choosing to treat or not to treat these symptoms is a decision that you makewith your health care provider. Do I need hormone replacement therapy? Hormone replacement therapy is effective in treating symptoms that are caused by menopause, such as hot flashes and night sweats. Hormone replacement carries certain risks, especially as you become older. If you are thinking about using estrogen or estrogen with progestin, discuss the benefits and risks with your health care provider. What is my risk for heart disease and stroke? The risk of heart disease, heart attack, and stroke increases as you age. One of the causes may be a change in the body's hormones during menopause. This can affect how your body uses dietary fats, triglycerides, and cholesterol. Heart attack and stroke are medical emergencies. There are many things that you cando to help  prevent heart disease and stroke. Watch your blood pressure High blood pressure causes heart disease and increases the risk of stroke. This is more likely to develop in people who have high blood pressure readings, are of African descent, or are overweight. Have your blood pressure checked: Every 3-5 years if you are 21-79 years of age. Every year if you are 32 years old or older. Eat a healthy diet  Eat a diet that includes plenty of vegetables, fruits, low-fat dairy products, and lean protein. Do not eat a lot of foods that are high in solid fats, added sugars, or sodium.  Get regular exercise Get regular exercise. This is one of the most important things you can do for your health. Most adults should: Try to exercise for at least 150 minutes each week. The exercise should increase your heart rate and make you sweat (moderate-intensity exercise). Try to do strengthening exercises at least twice each week. Do these in addition to the moderate-intensity exercise. Spend less time sitting. Even light physical activity can be beneficial. Other tips Work with your health care provider to achieve or maintain a healthy weight. Do not use any products that contain nicotine or tobacco, such as cigarettes, e-cigarettes, and chewing tobacco. If you need help quitting, ask your health care provider. Know your numbers. Ask your health care provider to check your cholesterol and your blood sugar (glucose). Continue to have your blood tested as directed by your health care provider. Do I need screening for cancer? Depending on your health history and family history, you may need to have cancer screening at different stages of your life. This may include screening  for: Breast cancer. Cervical cancer. Lung cancer. Colorectal cancer. What is my risk for osteoporosis? After menopause, you may be at increased risk for osteoporosis. Osteoporosis is a condition in which bone destruction happens more quickly  than new bone creation. To help prevent osteoporosis or the bone fractures that can happen because of osteoporosis, you may take the following actions: If you are 36-58 years old, get at least 1,000 mg of calcium and at least 600 mg of vitamin D per day. If you are older than age 38 but younger than age 34, get at least 1,200 mg of calcium and at least 600 mg of vitamin D per day. If you are older than age 40, get at least 1,200 mg of calcium and at least 800 mg of vitamin D per day. Smoking and drinking excessive alcohol increase the risk of osteoporosis. Eat foods that are rich in calcium and vitamin D, and do weight-bearing exercisesseveral times each week as directed by your health care provider. How does menopause affect my mental health? Depression may occur at any age, but it is more common as you become older. Common symptoms of depression include: Low or sad mood. Changes in sleep patterns. Changes in appetite or eating patterns. Feeling an overall lack of motivation or enjoyment of activities that you previously enjoyed. Frequent crying spells. Talk with your health care provider if you think that you are experiencingdepression. General instructions See your health care provider for regular wellness exams and vaccines. This may include: Scheduling regular health, dental, and eye exams. Getting and maintaining your vaccines. These include: Influenza vaccine. Get this vaccine each year before the flu season begins. Pneumonia vaccine. Shingles vaccine. Tetanus, diphtheria, and pertussis (Tdap) booster vaccine. Your health care provider may also recommend other immunizations. Tell your health care provider if you have ever been abused or do not feel safeat home. Summary Menopause is a normal process in which your ability to get pregnant comes to an end. This condition causes hot flashes, night sweats, decreased interest in sex, mood swings, headaches, or lack of sleep. Treatment for  this condition may include hormone replacement therapy. Take actions to keep yourself healthy, including exercising regularly, eating a healthy diet, watching your weight, and checking your blood pressure and blood sugar levels. Get screened for cancer and depression. Make sure that you are up to date with all your vaccines. This information is not intended to replace advice given to you by your health care provider. Make sure you discuss any questions you have with your healthcare provider. Document Revised: 05/16/2018 Document Reviewed: 05/16/2018 Elsevier Patient Education  2022 [employer].

## 2021-01-27 NOTE — Addendum Note (Signed)
Addended by: Baron Hamper on: 01/27/2021 04:25 PM   Modules accepted: Orders

## 2021-01-27 NOTE — Progress Notes (Signed)
Subjective:    Patient ID: [name], female    DOB: 1970/06/11, 50 y.o.   MRN: [id number]  CC: [name] is a 50 y.o. female who presents today for physical exam.    HPI: Feels well today.  No new concerns.  She had her mammogram earlier today.    Bipolar- compliant with abilify, depakote, trazodone. Plans to establish with Dr Modesta Messing.  She feels regimen is overall [name] well.    Colorectal Cancer Screening: due, arranged for tomorrow Breast Cancer Screening: Mammogram performed today, await results.  Cervical Cancer Screening: due; declines my doing pap smear and prefers GYN. She canceled appointment with [facility] and plans to reschedule.  No vaginal pain          Tetanus - utd         Labs: Screening labs today. Exercise: Gets regular exercise.   Alcohol use:  none Smoking/tobacco use: Nonsmoker.     HISTORY:  Past Medical History:  Diagnosis Date   Akathisia 12/17/2014   Bipolar affective ([place])    Restless leg syndrome    Schizoaffective disorder ([place])    Schizoaffective disorder ([place])     Past Surgical History:  Procedure Laterality Date   APPENDECTOMY     CESAREAN SECTION     LAPAROSCOPIC APPENDECTOMY N/A 07/02/2020   Procedure: APPENDECTOMY LAPAROSCOPIC;  Surgeon: Fredirick Maudlin, MD;  Location: [facility] ORS;  Service: General;  Laterality: N/A;   Family History  Problem Relation Age of Onset   Drug abuse Maternal Aunt       ALLERGIES: Patient has no known allergies.  Current Outpatient Medications on File Prior to Visit  Medication Sig Dispense Refill   ARIPiprazole (ABILIFY) 5 MG tablet Take 1 tablet (5 mg total) by mouth daily. 30 tablet 0   ARIPiprazole ER (ABILIFY MAINTENA) 400 MG PRSY prefilled syringe Inject 400 mg into the muscle every 28 (twenty-eight) days. 1 each 0   divalproex (DEPAKOTE) 500 MG DR tablet Take 1 tablet (500 mg total) by mouth at bedtime. 30 tablet 0   metFORMIN (GLUCOPHAGE XR) 500 MG 24 hr tablet Start 581m PO  qpm. 90 tablet 3   Sod Picosulfate-Mag Ox-Cit Acd (CLENPIQ) 10-3.5-12 MG-GM -GM/160ML SOLN Take 320 mLs by mouth as directed. 320 mL 0   traZODone (DESYREL) 50 MG tablet Take 1 tablet (50 mg total) by mouth at bedtime as needed for sleep. 30 tablet 0   No current facility-administered medications on file prior to visit.    Social History   Tobacco Use   Smoking status: Every Day    Packs/day: 0.50    Years: 10.00    Pack years: 5.00    Types: Cigarettes   Smokeless tobacco: Never  Vaping Use   Vaping Use: Former  Substance Use Topics   Alcohol use: No   Drug use: No    Review of Systems  Constitutional:  Negative for chills, fever and unexpected weight change.  HENT:  Negative for congestion.   Respiratory:  Negative for cough.   Cardiovascular:  Negative for chest pain, palpitations and leg swelling.  Gastrointestinal:  Negative for nausea and vomiting.  Musculoskeletal:  Negative for arthralgias and myalgias.  Skin:  Negative for rash.  Neurological:  Negative for headaches.  Hematological:  Negative for adenopathy.  Psychiatric/Behavioral:  Negative for confusion.      Objective:    BP 122/80   Pulse 83   Ht 5' 2.21" (1.58 m)   Wt 217 lb  12.8 oz (98.8 kg)   LMP 12/17/2014   SpO2 96%   BMI 39.57 kg/m   BP Readings from Last 3 Encounters:  01/27/21 122/80  12/18/20 112/62  10/16/20 127/79   Wt Readings from Last 3 Encounters:  01/27/21 217 lb 12.8 oz (98.8 kg)  12/18/20 215 lb 12.8 oz (97.9 kg)  10/16/20 210 lb (95.3 kg)    Physical Exam Vitals reviewed.  Constitutional:      Appearance: She is well-developed.  Eyes:     Conjunctiva/sclera: Conjunctivae normal.  Neck:     Thyroid: No thyroid mass or thyromegaly.  Cardiovascular:     Rate and Rhythm: Normal rate and regular rhythm.     Pulses: Normal pulses.     Heart sounds: Normal heart sounds.  Pulmonary:     Effort: Pulmonary effort is normal.     Breath sounds: Normal breath sounds. No  wheezing, rhonchi or rales.  Lymphadenopathy:     Head:     Right side of head: No submental, submandibular, tonsillar, preauricular, posterior auricular or occipital adenopathy.     Left side of head: No submental, submandibular, tonsillar, preauricular, posterior auricular or occipital adenopathy.     Cervical: No cervical adenopathy.  Skin:    General: Skin is warm and dry.  Neurological:     Mental Status: She is alert.  Psychiatric:        Speech: Speech normal.        Behavior: Behavior normal.        Thought Content: Thought content normal.       Assessment & Plan:   Problem List Items Addressed This Visit       Other   Bipolar affective disorder, current episode manic (HCC)    Chronic, stable.  Patient will continue to follow with psychiatry.  Continue current regimen      Routine physical examination    Patient declines clinical breast exam as mammogram is up-to-date.  Will await results.  She also declines Pap smear which is due but she prefers to follow with GYN and she has been established with [facility] in the past.  She will call them to reschedule.  Colonoscopy tomorrow.      Other Visit Diagnoses     Routine health maintenance    -  Primary   Relevant Orders   HgB A1c   Lipid panel   TSH   Comp Met (CMET)   CBC w/Diff   VITAMIN D 25 Hydroxy (Vit-D Deficiency, Fractures)        I am having [name] maintain her traZODone, metFORMIN, ARIPiprazole ER, ARIPiprazole, divalproex, and Clenpiq.   No orders of the defined types were placed in this encounter.   Return precautions given.   Risks, benefits, and alternatives of the medications and treatment plan prescribed today were discussed, and patient expressed understanding.   Education regarding symptom management and diagnosis given to patient on AVS.   Continue to follow with Burnard Hawthorne, FNP for routine health maintenance.   [name] and I agreed with plan.    Mable Paris, FNP

## 2021-01-27 NOTE — Telephone Encounter (Signed)
Send

## 2021-01-28 ENCOUNTER — Encounter: Payer: Self-pay | Admitting: Gastroenterology

## 2021-01-28 ENCOUNTER — Ambulatory Visit
Admission: RE | Admit: 2021-01-28 | Discharge: 2021-01-28 | Disposition: A | Discharge status: Home / Self Care | Payer: 59 | Attending: Gastroenterology | Admitting: Gastroenterology

## 2021-01-28 ENCOUNTER — Ambulatory Visit: Payer: 59 | Admitting: Anesthesiology

## 2021-01-28 ENCOUNTER — Encounter
Admission: RE | Disposition: A | Discharge status: Home / Self Care | Payer: Self-pay | Source: Home / Self Care | Attending: Gastroenterology

## 2021-01-28 DIAGNOSIS — F17210 Nicotine dependence, cigarettes, uncomplicated: Secondary | ICD-10-CM | POA: Insufficient documentation

## 2021-01-28 DIAGNOSIS — Z1211 Encounter for screening for malignant neoplasm of colon: Secondary | ICD-10-CM | POA: Diagnosis not present

## 2021-01-28 DIAGNOSIS — Z7984 Long term (current) use of oral hypoglycemic drugs: Secondary | ICD-10-CM | POA: Insufficient documentation

## 2021-01-28 DIAGNOSIS — D125 Benign neoplasm of sigmoid colon: Secondary | ICD-10-CM | POA: Diagnosis not present

## 2021-01-28 DIAGNOSIS — K635 Polyp of colon: Secondary | ICD-10-CM | POA: Diagnosis not present

## 2021-01-28 DIAGNOSIS — Z79899 Other long term (current) drug therapy: Secondary | ICD-10-CM | POA: Diagnosis not present

## 2021-01-28 DIAGNOSIS — D126 Benign neoplasm of colon, unspecified: Secondary | ICD-10-CM | POA: Diagnosis not present

## 2021-01-28 HISTORY — PX: COLONOSCOPY WITH PROPOFOL: SHX5780

## 2021-01-28 LAB — COMPREHENSIVE METABOLIC PANEL
ALT: 22 U/L (ref 0–35)
AST: 16 U/L (ref 0–37)
Albumin: 4.2 g/dL (ref 3.5–5.2)
Alkaline Phosphatase: 80 U/L (ref 39–117)
BUN: 14 mg/dL (ref 6–23)
CO2: 24 mEq/L (ref 19–32)
Calcium: 9.4 mg/dL (ref 8.4–10.5)
Chloride: 104 mEq/L (ref 96–112)
Creatinine, Ser: 0.7 mg/dL (ref 0.40–1.20)
GFR: 100.79 mL/min (ref >60.00)
Glucose, Bld: 78 mg/dL (ref 70–99)
Potassium: 4.1 mEq/L (ref 3.5–5.1)
Sodium: 138 mEq/L (ref 135–145)
Total Bilirubin: 0.4 mg/dL (ref 0.2–1.2)
Total Protein: 7 g/dL (ref 6.0–8.3)

## 2021-01-28 LAB — CBC WITH DIFFERENTIAL/PLATELET
Basophils Absolute: 0.1 10*3/uL (ref 0.0–0.1)
Basophils Relative: 0.9 % (ref 0.0–3.0)
Eosinophils Absolute: 0.2 10*3/uL (ref 0.0–0.7)
Eosinophils Relative: 2.9 % (ref 0.0–5.0)
HCT: 41.6 % (ref 36.0–46.0)
Hemoglobin: 13.9 g/dL (ref 12.0–15.0)
Lymphocytes Relative: 36.7 % (ref 12.0–46.0)
Lymphs Abs: 3.1 10*3/uL (ref 0.7–4.0)
MCHC: 33.4 g/dL (ref 30.0–36.0)
MCV: 89.8 fl (ref 78.0–100.0)
Monocytes Absolute: 0.7 10*3/uL (ref 0.1–1.0)
Monocytes Relative: 8.2 % (ref 3.0–12.0)
Neutro Abs: 4.3 10*3/uL (ref 1.4–7.7)
Neutrophils Relative %: 51.3 % (ref 43.0–77.0)
Platelets: 302 10*3/uL (ref 150.0–400.0)
RBC: 4.63 Mil/uL (ref 3.87–5.11)
RDW: 14 % (ref 11.5–15.5)
WBC: 8.3 10*3/uL (ref 4.0–10.5)

## 2021-01-28 LAB — VITAMIN D 25 HYDROXY (VIT D DEFICIENCY, FRACTURES): VITD: 23.1 ng/mL — ABNORMAL LOW (ref 30.00–100.00)

## 2021-01-28 LAB — LIPID PANEL
Cholesterol: 187 mg/dL (ref 0–200)
HDL: 42.1 mg/dL (ref >39.00)
LDL Cholesterol: 108 mg/dL — ABNORMAL HIGH (ref 0–99)
NonHDL: 144.79
Total CHOL/HDL Ratio: 4
Triglycerides: 183 mg/dL — ABNORMAL HIGH (ref 0.0–149.0)
VLDL: 36.6 mg/dL (ref 0.0–40.0)

## 2021-01-28 LAB — HEMOGLOBIN A1C: Hgb A1c MFr Bld: 5.8 % (ref 4.6–6.5)

## 2021-01-28 LAB — TSH: TSH: 1.77 u[IU]/mL (ref 0.35–5.50)

## 2021-01-28 SURGERY — COLONOSCOPY WITH PROPOFOL
Anesthesia: General

## 2021-01-28 MED ORDER — PROPOFOL 500 MG/50ML IV EMUL
INTRAVENOUS | Status: DC | PRN
  Administered 2021-01-28: 150 ug/kg/min via INTRAVENOUS

## 2021-01-28 MED ORDER — SODIUM CHLORIDE 0.9 % IV SOLN
INTRAVENOUS | Status: DC
  Administered 2021-01-28: 1000 mL via INTRAVENOUS

## 2021-01-28 MED ORDER — PROPOFOL 10 MG/ML IV BOLUS
INTRAVENOUS | Status: AC
  Filled 2021-01-28: qty 20

## 2021-01-28 MED ORDER — LIDOCAINE HCL (CARDIAC) PF 100 MG/5ML IV SOSY
PREFILLED_SYRINGE | INTRAVENOUS | Status: DC | PRN
  Administered 2021-01-28: 50 mg via INTRAVENOUS

## 2021-01-28 MED ORDER — DEXMEDETOMIDINE (PRECEDEX) IN NS 20 MCG/5ML (4 MCG/ML) IV SYRINGE
PREFILLED_SYRINGE | INTRAVENOUS | Status: DC | PRN
  Administered 2021-01-28: 8 ug via INTRAVENOUS
  Administered 2021-01-28: 4 ug via INTRAVENOUS
  Administered 2021-01-28: 8 ug via INTRAVENOUS

## 2021-01-28 MED ORDER — PROPOFOL 10 MG/ML IV BOLUS
INTRAVENOUS | Status: DC | PRN
  Administered 2021-01-28: 50 mg via INTRAVENOUS
  Administered 2021-01-28: 20 mg via INTRAVENOUS
  Administered 2021-01-28: 30 mg via INTRAVENOUS

## 2021-01-28 NOTE — Anesthesia Postprocedure Evaluation (Signed)
Anesthesia Post Note  Patient: [name]  Procedure(s) Performed: COLONOSCOPY WITH PROPOFOL  Patient location during evaluation: Endoscopy Anesthesia Type: General Level of consciousness: awake and alert Pain management: pain level controlled Vital Signs Assessment: post-procedure vital signs reviewed and stable Respiratory status: spontaneous breathing, nonlabored ventilation, respiratory function stable and patient connected to nasal cannula oxygen Cardiovascular status: blood pressure returned to baseline and stable Postop Assessment: no apparent nausea or vomiting Anesthetic complications: no   No notable events documented.   Last Vitals:  Vitals:   01/28/21 0954 01/28/21 1004  BP: 125/70 102/60  Pulse:    Resp: 19   Temp:    SpO2: 97%     Last Pain:  Vitals:   01/28/21 1004  TempSrc:   PainSc: 0-No pain                 Martha Clan

## 2021-01-28 NOTE — Anesthesia Preprocedure Evaluation (Addendum)
Anesthesia Evaluation  Patient identified by MRN, date of birth, ID band Patient awake    Reviewed: Allergy & Precautions, H&P , NPO status , Patient's Chart, lab work & pertinent test results, reviewed documented beta blocker date and time   History of Anesthesia Complications Negative for: history of anesthetic complications  Airway Mallampati: II  TM Distance: >3 FB Neck ROM: full    Dental  (+) Dental Advidsory Given, Teeth Intact, Caps   Pulmonary neg shortness of breath, neg sleep apnea, neg COPD, neg recent URI, Current Smoker and Patient abstained from smoking.,    Pulmonary exam normal breath sounds clear to auscultation       Cardiovascular Exercise Tolerance: Good (-) hypertension(-) angina+CHF (with a pregnancy)  (-) Past MI and (-) Cardiac Stents Normal cardiovascular exam(-) dysrhythmias (-) Valvular Problems/Murmurs Rhythm:regular Rate:Normal     Neuro/Psych PSYCHIATRIC DISORDERS Bipolar Disorder Schizophrenia negative neurological ROS     GI/Hepatic Neg liver ROS, GERD  ,  Endo/Other  diabetes (borderline)  Renal/GU negative Renal ROS  negative genitourinary   Musculoskeletal   Abdominal   Peds  Hematology negative hematology ROS (+)   Anesthesia Other Findings Past Medical History: 12/17/2014: Akathisia No date: Bipolar affective ([place]) No date: Restless leg syndrome No date: Schizoaffective disorder (HCC) No date: Schizoaffective disorder (HCC)   Reproductive/Obstetrics negative OB ROS                            Anesthesia Physical  Anesthesia Plan  ASA: 2  Anesthesia Plan: General   Post-op Pain Management:    Induction: Intravenous  PONV Risk Score and Plan: 2 and Treatment may vary due to age or medical condition, TIVA and Propofol infusion  Airway Management Planned: Natural Airway and Nasal Cannula  Additional Equipment:   Intra-op Plan:    Post-operative Plan:   Informed Consent: I have reviewed the patients History and Physical, chart, labs and discussed the procedure including the risks, benefits and alternatives for the proposed anesthesia with the patient or authorized representative who has indicated his/her understanding and acceptance.     Dental Advisory Given  Plan Discussed with: Anesthesiologist, CRNA and Surgeon  Anesthesia Plan Comments:        Anesthesia Quick Evaluation

## 2021-01-28 NOTE — Op Note (Signed)
[facility] Gastroenterology Patient Name: [name] Procedure Date: 01/28/2021 9:11 AM MRN: [id number] Account #: [id number] Date of Birth: 10-01-1970 Admit Type: Outpatient Age: 50 Room: [facility] ENDO ROOM 4 Gender: Female Note Status: Finalized Procedure:             Colonoscopy Indications:           Screening for colorectal malignant neoplasm Providers:             [name] MD, MD Referring MD:          No Local Md, MD (Referring MD) Medicines:             Propofol per Anesthesia Complications:         No immediate complications. Procedure:             Pre-Anesthesia Assessment:                        - Prior to the procedure, a History and Physical was                         performed, and patient medications and allergies were                         reviewed. The patient's tolerance of previous                         anesthesia was also reviewed. The risks and benefits                         of the procedure and the sedation options and risks                         were discussed with the patient. All questions were                         answered, and informed consent was obtained. Prior                         Anticoagulants: The patient has taken no previous                         anticoagulant or antiplatelet agents. ASA Grade                         Assessment: II - A patient with mild systemic disease.                         After reviewing the risks and benefits, the patient                         was deemed in satisfactory condition to undergo the                         procedure.                        After obtaining informed consent, the colonoscope was  passed under direct vision. Throughout the procedure,                         the patient's blood pressure, pulse, and oxygen                         saturations were monitored continuously. The                         Colonoscope was introduced through the  anus and                         advanced to the the cecum, identified by appendiceal                         orifice and ileocecal valve. The colonoscopy was                         performed without difficulty. The patient tolerated                         the procedure well. The quality of the bowel                         preparation was excellent. Findings:      The perianal and digital rectal examinations were normal.      Three sessile polyps were found in the sigmoid colon. The polyps were 3       to 4 mm in size. These polyps were removed with a cold snare. Resection       and retrieval were complete. Impression:            - Three 3 to 4 mm polyps in the sigmoid colon, removed                         with a cold snare. Resected and retrieved. Recommendation:        - Discharge patient to home.                        - Resume previous diet.                        - Continue present medications.                        - Await pathology results.                        - Repeat colonoscopy in 5 years for surveillance if                         adenomaotous otherwise 10 years. Procedure Code(s):     --- Professional ---                        [phone], Colonoscopy, flexible; with removal of                         tumor(s), polyp(s), or other lesion(s) by snare  technique Diagnosis Code(s):     --- Professional ---                        Z12.11, Encounter for screening for malignant neoplasm                         of colon                        K63.5, Polyp of colon CPT copyright 2019 American Medical Association. All rights reserved. The codes documented in this report are preliminary and upon coder review may  be revised to meet current compliance requirements. [name] MD, MD 01/28/2021 9:32:11 AM This report has been signed electronically. Number of Addenda: 0 Note Initiated On: 01/28/2021 9:11 AM Scope Withdrawal Time: 0 hours 7 minutes 57 seconds   Total Procedure Duration: 0 hours 10 minutes 30 seconds  Estimated Blood Loss:  Estimated blood loss: none.      [facility]

## 2021-01-28 NOTE — H&P (Signed)
Brittany Lame, MD [address] [place], [place] [address] Phone: [phone] Fax : [phone]  Primary Care Physician:  Burnard Hawthorne, FNP Primary Gastroenterologist:  Dr. Allen Norris  Pre-Procedure History & Physical: HPI:  [name] is a 50 y.o. female is here for a screening colonoscopy.   Past Medical History:  Diagnosis Date   Akathisia 12/17/2014   Bipolar affective ([place])    Restless leg syndrome    Schizoaffective disorder ([place])    Schizoaffective disorder ([place])     Past Surgical History:  Procedure Laterality Date   APPENDECTOMY     CESAREAN SECTION     LAPAROSCOPIC APPENDECTOMY N/A 07/02/2020   Procedure: APPENDECTOMY LAPAROSCOPIC;  Surgeon: Fredirick Maudlin, MD;  Location: [facility] ORS;  Service: General;  Laterality: N/A;    Prior to Admission medications   Medication Sig Start Date End Date Taking? Authorizing Provider  ARIPiprazole (ABILIFY) 5 MG tablet Take 1 tablet (5 mg total) by mouth daily. 12/22/20  Yes Arfeen, Arlyce Harman, MD  ARIPiprazole ER (ABILIFY MAINTENA) 400 MG PRSY prefilled syringe Inject 400 mg into the muscle every 28 (twenty-eight) days. 01/27/21  Yes Arfeen, Arlyce Harman, MD  divalproex (DEPAKOTE) 500 MG DR tablet Take 1 tablet (500 mg total) by mouth at bedtime. 12/22/20  Yes Arfeen, Arlyce Harman, MD  Sod Picosulfate-Mag Ox-Cit Acd (CLENPIQ) 10-3.5-12 MG-GM -GM/160ML SOLN Take 320 mLs by mouth as directed. 01/18/21  Yes Brittany Lame, MD  traZODone (DESYREL) 50 MG tablet Take 1 tablet (50 mg total) by mouth at bedtime as needed for sleep. 11/13/20  Yes Arfeen, Arlyce Harman, MD  metFORMIN (GLUCOPHAGE XR) 500 MG 24 hr tablet Start '500mg'$  PO qpm. Patient not taking: Reported on 01/28/2021 12/18/20   Burnard Hawthorne, FNP    Allergies as of 12/30/2020   (No Known Allergies)    Family History  Problem Relation Age of Onset   Drug abuse Maternal Aunt     Social History   Socioeconomic History   Marital status: Married    Spouse name: [name]   Number  of children: 2   Years of education: 13   Highest education level: Some college, no degree  Occupational History   Not on file  Tobacco Use   Smoking status: Every Day    Packs/day: 0.50    Years: 10.00    Pack years: 5.00    Types: Cigarettes   Smokeless tobacco: Never  Vaping Use   Vaping Use: Former  Substance and Sexual Activity   Alcohol use: No   Drug use: No   Sexual activity: Yes  Other Topics Concern   Not on file  Social History Narrative   34 year old daughter      Married      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 Neck:  Supple; no masses or thyromegaly. Lungs:  Clear throughout to auscultation.    Heart:  Regular rate and rhythm. Abdomen:  Soft, nontender and nondistended. Normal bowel sounds, without guarding, and without rebound.   Neurologic:  Alert and  oriented x4;  grossly normal neurologically.  Impression/Plan: [name] is now here to undergo a screening colonoscopy.  Risks, benefits, and alternatives regarding colonoscopy have been reviewed with the patient.  Questions have been answered.  All parties agreeable.

## 2021-01-28 NOTE — Transfer of Care (Signed)
Immediate Anesthesia Transfer of Care Note  Patient: [facility]  Procedure(s) Performed: COLONOSCOPY WITH PROPOFOL  Patient Location: PACU  Anesthesia Type:General  Level of Consciousness: drowsy and patient cooperative  Airway & Oxygen Therapy: Patient Spontanous Breathing  Post-op Assessment: Report given to RN and Post -op Vital signs reviewed and stable  Post vital signs: Reviewed and stable  Last Vitals:  Vitals Value Taken Time  BP 146/90 01/28/21 0934  Temp 36.3 C 01/28/21 0934  Pulse 87 01/28/21 0936  Resp 25 01/28/21 0936  SpO2 95 % 01/28/21 0936  Vitals shown include unvalidated device data.  Last Pain:  Vitals:   01/28/21 0934  TempSrc: Temporal  PainSc: Asleep         Complications: No notable events documented.

## 2021-01-29 ENCOUNTER — Other Ambulatory Visit (HOSPITAL_COMMUNITY): Payer: Self-pay

## 2021-01-29 ENCOUNTER — Other Ambulatory Visit: Payer: Self-pay

## 2021-01-29 ENCOUNTER — Ambulatory Visit (INDEPENDENT_AMBULATORY_CARE_PROVIDER_SITE_OTHER): Payer: 59

## 2021-01-29 ENCOUNTER — Telehealth (HOSPITAL_COMMUNITY): Payer: 59 | Admitting: Psychiatry

## 2021-01-29 ENCOUNTER — Encounter: Payer: Self-pay | Admitting: Gastroenterology

## 2021-01-29 DIAGNOSIS — F3113 Bipolar disorder, current episode manic without psychotic features, severe: Secondary | ICD-10-CM | POA: Diagnosis not present

## 2021-01-29 DIAGNOSIS — F310 Bipolar disorder, current episode hypomanic: Secondary | ICD-10-CM

## 2021-01-29 LAB — SURGICAL PATHOLOGY

## 2021-01-29 MED ORDER — ARIPIPRAZOLE ER 400 MG IM PRSY
400.0000 mg | PREFILLED_SYRINGE | Freq: Once | INTRAMUSCULAR | Status: AC
  Administered 2021-02-26: 400 mg via INTRAMUSCULAR

## 2021-01-29 MED ORDER — ARIPIPRAZOLE ER 400 MG IM PRSY
400.0000 mg | PREFILLED_SYRINGE | Freq: Once | INTRAMUSCULAR | Status: AC
  Administered 2021-01-29: 400 mg via INTRAMUSCULAR

## 2021-01-29 NOTE — Progress Notes (Signed)
abilify maintena '400mg'$  injection given rt deltoid. NDC# [id number] Lot # [id number] exp 9-24  S/N# [id number].  Pt is doing well on the medication. Pt is clean and states she is doing well.  Pt to return within 28 days for next injection

## 2021-01-29 NOTE — Patient Instructions (Signed)
abilify maintena '400mg'$  injection given rt deltoid. NDC# [id number] Lot # [id number] exp 9-24  S/N# [id number].  Pt is doing well on the medication. Pt is clean and states she is doing well

## 2021-01-30 ENCOUNTER — Telehealth (HOSPITAL_COMMUNITY): Payer: 59 | Admitting: Psychiatry

## 2021-02-02 ENCOUNTER — Other Ambulatory Visit: Payer: Self-pay | Admitting: Psychiatry

## 2021-02-02 ENCOUNTER — Other Ambulatory Visit: Payer: Self-pay

## 2021-02-02 DIAGNOSIS — F319 Bipolar disorder, unspecified: Secondary | ICD-10-CM

## 2021-02-02 DIAGNOSIS — F419 Anxiety disorder, unspecified: Secondary | ICD-10-CM

## 2021-02-02 MED ORDER — ARIPIPRAZOLE 5 MG PO TABS
5.0000 mg | ORAL_TABLET | Freq: Every day | ORAL | 1 refills | Status: DC
  Filled 2021-02-02: qty 30, 30d supply, fill #0
  Filled 2021-03-15: qty 30, 30d supply, fill #1

## 2021-02-02 MED ORDER — DIVALPROEX SODIUM 500 MG PO DR TAB
500.0000 mg | DELAYED_RELEASE_TABLET | Freq: Every day | ORAL | 1 refills | Status: DC
  Filled 2021-02-02: qty 30, 30d supply, fill #0
  Filled 2021-03-16: qty 30, 30d supply, fill #1

## 2021-02-03 ENCOUNTER — Other Ambulatory Visit: Payer: Self-pay

## 2021-02-04 ENCOUNTER — Encounter: Payer: Self-pay | Admitting: Gastroenterology

## 2021-02-04 ENCOUNTER — Ambulatory Visit: Payer: 59 | Admitting: Psychiatry

## 2021-02-24 ENCOUNTER — Telehealth: Payer: Self-pay

## 2021-02-24 NOTE — Telephone Encounter (Signed)
pt called states she needs a refill on the abilify maintena prefilled syringe

## 2021-02-25 ENCOUNTER — Other Ambulatory Visit: Payer: Self-pay

## 2021-02-25 MED ORDER — ABILIFY MAINTENA 400 MG IM PRSY
PREFILLED_SYRINGE | INTRAMUSCULAR | 0 refills | Status: DC
  Filled 2021-02-25: qty 1, 28d supply, fill #0

## 2021-02-25 NOTE — Telephone Encounter (Signed)
Called in rx per Dr. De Nurse orders. -  done

## 2021-02-26 ENCOUNTER — Ambulatory Visit (HOSPITAL_BASED_OUTPATIENT_CLINIC_OR_DEPARTMENT_OTHER): Payer: 59

## 2021-02-26 ENCOUNTER — Other Ambulatory Visit: Payer: Self-pay

## 2021-02-26 ENCOUNTER — Ambulatory Visit: Payer: 59

## 2021-02-26 DIAGNOSIS — F3113 Bipolar disorder, current episode manic without psychotic features, severe: Secondary | ICD-10-CM | POA: Diagnosis not present

## 2021-02-26 DIAGNOSIS — F310 Bipolar disorder, current episode hypomanic: Secondary | ICD-10-CM | POA: Diagnosis not present

## 2021-02-26 NOTE — Progress Notes (Signed)
abilify left deltoid. NDC# 12258-346-21 Lot # [id number] exp 07 -24  S/N# [id number].  Pt is doing well on the medication. Pt is clean and states she is doing well.  Pt to return within 28 days for next injection

## 2021-02-26 NOTE — Patient Instructions (Signed)
abilify left deltoid. NDC# 14445-848-35 Lot # [id number] exp 07 -24  S/N# [id number].  Pt is doing well on the medication. Pt is clean and states she is doing well.  Pt to return within 28 days for next injection

## 2021-03-09 ENCOUNTER — Encounter: Payer: Self-pay | Admitting: General Surgery

## 2021-03-15 ENCOUNTER — Other Ambulatory Visit: Payer: Self-pay

## 2021-03-16 ENCOUNTER — Other Ambulatory Visit: Payer: Self-pay

## 2021-03-17 ENCOUNTER — Other Ambulatory Visit: Payer: Self-pay
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
## 2021-03-22 ENCOUNTER — Encounter: Payer: Self-pay | Admitting: Psychiatry

## 2021-03-22 ENCOUNTER — Other Ambulatory Visit: Payer: Self-pay

## 2021-03-22 ENCOUNTER — Ambulatory Visit (INDEPENDENT_AMBULATORY_CARE_PROVIDER_SITE_OTHER): Payer: 59 | Admitting: Psychiatry

## 2021-03-22 DIAGNOSIS — F419 Anxiety disorder, unspecified: Secondary | ICD-10-CM

## 2021-03-22 DIAGNOSIS — F319 Bipolar disorder, unspecified: Secondary | ICD-10-CM | POA: Diagnosis not present

## 2021-03-22 MED ORDER — DIVALPROEX SODIUM 500 MG PO DR TAB
500.0000 mg | DELAYED_RELEASE_TABLET | Freq: Every day | ORAL | 0 refills | Status: DC
  Filled 2021-03-22 – 2021-04-26 (×2): qty 30, 30d supply, fill #0

## 2021-03-22 MED ORDER — ARIPIPRAZOLE 5 MG PO TABS
5.0000 mg | ORAL_TABLET | Freq: Every day | ORAL | 2 refills | Status: DC
  Filled 2021-03-22 – 2021-04-28 (×2): qty 30, 30d supply, fill #0
  Filled 2021-06-29: qty 30, 30d supply, fill #1

## 2021-03-22 NOTE — Patient Instructions (Addendum)
Continue Abilify Maintena monthly injection Continue Abilify 5 mg daily Continue Depakote 500 mg at night  Next appointment: 11/28 at 2 PM  in person Check lab/Depakote level The next visit will be in person visit. Please arrive 15 mins before the scheduled time.   [facility]  Address: [address], [place], [place] [address]

## 2021-03-24 ENCOUNTER — Telehealth: Payer: Self-pay | Admitting: Family

## 2021-03-24 NOTE — Telephone Encounter (Signed)
Patient called in stating that [employer] is requesting proof that she had her flu shot on 01/27/21 and they would like to receive a e-mail.Please call the patient at [phone] to see about getting proof of her flu shot.

## 2021-03-25 NOTE — Telephone Encounter (Signed)
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

## 2021-03-30 ENCOUNTER — Ambulatory Visit: Payer: 59

## 2021-03-30 ENCOUNTER — Other Ambulatory Visit: Payer: Self-pay

## 2021-03-30 ENCOUNTER — Other Ambulatory Visit: Payer: Self-pay | Admitting: Psychiatry

## 2021-03-30 ENCOUNTER — Telehealth: Payer: Self-pay

## 2021-03-30 MED ORDER — ABILIFY MAINTENA 400 MG IM PRSY
400.0000 mg | PREFILLED_SYRINGE | INTRAMUSCULAR | 5 refills | Status: DC
  Filled 2021-03-30: qty 1, 28d supply, fill #0
  Filled 2021-04-28: qty 1, 28d supply, fill #1
  Filled 2021-05-24: qty 1, 28d supply, fill #2
  Filled 2021-06-28: qty 1, 28d supply, fill #3
  Filled 2021-08-02: qty 1, 28d supply, fill #4
  Filled 2021-08-30: qty 1, 28d supply, fill #5

## 2021-03-30 NOTE — Telephone Encounter (Signed)
pt called states she needs her abilify maintena sent to the pharmacy today . she suppose to have injection tomorrow

## 2021-03-30 NOTE — Telephone Encounter (Signed)
Ordered

## 2021-03-31 ENCOUNTER — Other Ambulatory Visit: Payer: Self-pay

## 2021-04-01 ENCOUNTER — Other Ambulatory Visit: Payer: Self-pay

## 2021-04-01 ENCOUNTER — Ambulatory Visit (INDEPENDENT_AMBULATORY_CARE_PROVIDER_SITE_OTHER): Payer: 59

## 2021-04-01 ENCOUNTER — Other Ambulatory Visit: Payer: Self-pay | Admitting: Psychiatry

## 2021-04-01 DIAGNOSIS — F310 Bipolar disorder, current episode hypomanic: Secondary | ICD-10-CM | POA: Diagnosis not present

## 2021-04-01 DIAGNOSIS — F3113 Bipolar disorder, current episode manic without psychotic features, severe: Secondary | ICD-10-CM | POA: Diagnosis not present

## 2021-04-01 MED ORDER — ARIPIPRAZOLE ER 400 MG IM PRSY
400.0000 mg | PREFILLED_SYRINGE | INTRAMUSCULAR | Status: AC
  Administered 2021-04-01 – 2021-09-01 (×6): 400 mg via INTRAMUSCULAR

## 2021-04-01 NOTE — Patient Instructions (Signed)
abilify right deltoid. NDC# 19012-224-11 Lot # [id number] exp 02 -25  S/N# [id number].  Pt is doing well on the medication. Pt is clean and states she is doing well.  Pt to return within 28 days for next injection

## 2021-04-13 ENCOUNTER — Other Ambulatory Visit: Payer: Self-pay

## 2021-04-13 ENCOUNTER — Telehealth: Payer: Self-pay

## 2021-04-13 DIAGNOSIS — Z01419 Encounter for gynecological examination (general) (routine) without abnormal findings: Secondary | ICD-10-CM | POA: Diagnosis not present

## 2021-04-13 DIAGNOSIS — Z124 Encounter for screening for malignant neoplasm of cervix: Secondary | ICD-10-CM | POA: Diagnosis not present

## 2021-04-13 DIAGNOSIS — R232 Flushing: Secondary | ICD-10-CM | POA: Diagnosis not present

## 2021-04-13 MED ORDER — ESTRADIOL 0.1 MG/GM VA CREA
TOPICAL_CREAM | VAGINAL | 3 refills | Status: DC
  Filled 2021-04-13: qty 42.5, 90d supply, fill #0
  Filled 2021-11-21: qty 42.5, 90d supply, fill #1

## 2021-04-13 NOTE — Telephone Encounter (Signed)
Per EHR -Depakote level is in the system-ordered by Dr. Hurshel Party 03/22/2021.  We will print out Depakote level-lab order-patient could either come pick it up or Janett Billow CMA could fax it to the lab.  I have no idea why even though it is in the system is not available at the pharmacy.

## 2021-04-13 NOTE — Telephone Encounter (Signed)
pt called states that the labwork orders were not in the system.  to check her depakote levels.  she was going to go today and they stated that it was nothing in the system.  she would like to get done this week.

## 2021-04-22 NOTE — Telephone Encounter (Signed)
This has already been done.

## 2021-04-26 ENCOUNTER — Other Ambulatory Visit: Payer: Self-pay

## 2021-04-27 ENCOUNTER — Telehealth: Payer: Self-pay | Admitting: Psychiatry

## 2021-04-27 NOTE — Telephone Encounter (Signed)
Done
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
## 2021-04-27 NOTE — Telephone Encounter (Signed)
Pt was set up for appt tomorrow. Can you print out labwork orders and when she comes in for injection I can send her to get labs done.

## 2021-04-27 NOTE — Telephone Encounter (Signed)
Could you contact the patient and make sure to have a visit for injection, which will be due soon. Please also advise her to get blood test/depakote level. I do not see the result in the system yet.

## 2021-04-28 ENCOUNTER — Other Ambulatory Visit: Payer: Self-pay

## 2021-04-28 ENCOUNTER — Telehealth: Payer: Self-pay

## 2021-04-28 ENCOUNTER — Ambulatory Visit: Payer: 59

## 2021-04-28 NOTE — Telephone Encounter (Signed)
Could you verify with the pharmacy. She should have both injection and tabs.

## 2021-04-28 NOTE — Telephone Encounter (Signed)
pt left message that she needs a rx sent to the pharmacy for the abilify.

## 2021-04-30 ENCOUNTER — Other Ambulatory Visit: Payer: Self-pay

## 2021-05-03 ENCOUNTER — Ambulatory Visit (INDEPENDENT_AMBULATORY_CARE_PROVIDER_SITE_OTHER): Payer: 59 | Admitting: Psychiatry

## 2021-05-03 ENCOUNTER — Ambulatory Visit (INDEPENDENT_AMBULATORY_CARE_PROVIDER_SITE_OTHER): Payer: 59

## 2021-05-03 ENCOUNTER — Other Ambulatory Visit: Payer: Self-pay

## 2021-05-03 ENCOUNTER — Telehealth (HOSPITAL_COMMUNITY): Payer: Self-pay | Admitting: *Deleted

## 2021-05-03 ENCOUNTER — Encounter: Payer: Self-pay | Admitting: Psychiatry

## 2021-05-03 ENCOUNTER — Other Ambulatory Visit
Admission: RE | Admit: 2021-05-03 | Discharge: 2021-05-03 | Disposition: A | Discharge status: Home / Self Care | Payer: 59 | Attending: Psychiatry | Admitting: Psychiatry

## 2021-05-03 VITALS — BP 142/87 | HR 90 | Temp 97.5°F | Wt 230.4 lb

## 2021-05-03 DIAGNOSIS — F3113 Bipolar disorder, current episode manic without psychotic features, severe: Secondary | ICD-10-CM | POA: Diagnosis not present

## 2021-05-03 DIAGNOSIS — F310 Bipolar disorder, current episode hypomanic: Secondary | ICD-10-CM

## 2021-05-03 DIAGNOSIS — F319 Bipolar disorder, unspecified: Secondary | ICD-10-CM | POA: Diagnosis not present

## 2021-05-03 DIAGNOSIS — F419 Anxiety disorder, unspecified: Secondary | ICD-10-CM | POA: Diagnosis not present

## 2021-05-03 DIAGNOSIS — F4310 Post-traumatic stress disorder, unspecified: Secondary | ICD-10-CM

## 2021-05-03 LAB — HEPATIC FUNCTION PANEL
ALT: 27 U/L (ref 0–44)
AST: 22 U/L (ref 15–41)
Albumin: 3.8 g/dL (ref 3.5–5.0)
Alkaline Phosphatase: 73 U/L (ref 38–126)
Bilirubin, Direct: <0.1 mg/dL (ref 0.0–0.2)
Total Bilirubin: 0.4 mg/dL (ref 0.3–1.2)
Total Protein: 7.2 g/dL (ref 6.5–8.1)

## 2021-05-03 LAB — FERRITIN: Ferritin: 25 ng/mL (ref 11–307)

## 2021-05-03 LAB — VALPROIC ACID LEVEL: Valproic Acid Lvl: 27 ug/mL — ABNORMAL LOW (ref 50.0–100.0)

## 2021-05-03 NOTE — Progress Notes (Signed)
abilify left deltoid. NDC# 19417-408-14 Lot# 03--25  S/N# [id number]. GTIN# [id number].  Pt is doing well on the medication. Pt is clean and states she is doing well.  Pt to return within 28 days for next injection.

## 2021-05-03 NOTE — Telephone Encounter (Signed)
Noted, thanks!

## 2021-05-03 NOTE — Patient Instructions (Signed)
Return in 28 days for next injection.

## 2021-05-03 NOTE — Telephone Encounter (Signed)
Writer received a call from pt regarding pt being upset after visit today with Dr. Modesta Messing. Pt states that wants to come off Depakote and really doesn't think she even needs a mood stabilizer. Pt says that provider advised her that she may need a mood stabilizer indefinitely and that is what pt is ruminating about. Pt says that previous provider told her she wouldn't have to continue on mood stabilizer after starting the [place]. Pt speech was pressured and hyperverbal with some racing thoughts. Pt does not want to go to therapy she says. FYI.

## 2021-05-03 NOTE — Patient Instructions (Signed)
Continue Abilify Maintena monthly injection Continue Abilify 5 mg daily Continue Depakote 500 mg at night  Check lab/Depakote level, LFT, ferritin Next appointment: 1/16 at 2:30

## 2021-05-04 ENCOUNTER — Telehealth: Payer: Self-pay

## 2021-05-04 ENCOUNTER — Other Ambulatory Visit: Payer: Self-pay

## 2021-05-04 ENCOUNTER — Telehealth: Payer: Self-pay | Admitting: Psychiatry

## 2021-05-04 ENCOUNTER — Encounter: Payer: Self-pay | Admitting: Psychiatry

## 2021-05-04 DIAGNOSIS — F310 Bipolar disorder, current episode hypomanic: Secondary | ICD-10-CM

## 2021-05-04 MED ORDER — DIVALPROEX SODIUM 250 MG PO DR TAB
250.0000 mg | DELAYED_RELEASE_TABLET | Freq: Every day | ORAL | 0 refills | Status: DC
  Filled 2021-05-04: qty 30, 30d supply, fill #0

## 2021-05-04 NOTE — Telephone Encounter (Signed)
pt picked up both 6 days ago.

## 2021-05-04 NOTE — Telephone Encounter (Signed)
Discussed with the patient. Details in the note.  Could you print out and fax the lab order (depakote) to lab in [place]? It is expected to be done five days after the medication change (increased depakote dose)

## 2021-05-04 NOTE — Progress Notes (Signed)
pt states she got the message about the depakote she states that she does not like to be on a mood stabilier.  she states that she was told that she wouldn't have to be on it long from Dr. Adele Schilder but now she having to go up on the dosage..  She very concerned about this.

## 2021-05-04 NOTE — Telephone Encounter (Signed)
pt states she got the message about the depakote she states that she does not like to be on a mood stabilier.  she states that she was told that she wouldn't have to be on it long from Dr. Adele Schilder but now she having to go up on the dosage..  She very concerned

## 2021-05-04 NOTE — Telephone Encounter (Signed)
Discussed with the patient.  Although she does not think she needs to stay on Depakote, she states that she will take Depakote if that is recommended.  She agrees with the following plan.  -Increase Depakote to 750 mg at night -Obtain Depakote level 5 days after making this change.

## 2021-05-04 NOTE — Progress Notes (Signed)
Could you contact the patient and advise below.  - Depakote level is in subtherapeutic range. I would recommend she increases up Depakote to 750 mg total at night.  - Liver function is in normal range - Ferritin level is low. I would recommend she follows up with her primary care doctor for further evaluation/treatment especially given her symptom of restless leg.   - Let me know if she agrees to increase the dose of Depakote so that I can send in the medication to the pharmacy.  - Please also advise her to obtain blood test (Depakote) five days after taking the higher dose for monitoring.

## 2021-05-05 ENCOUNTER — Other Ambulatory Visit: Payer: Self-pay

## 2021-05-14 ENCOUNTER — Telehealth: Payer: Self-pay

## 2021-05-14 ENCOUNTER — Other Ambulatory Visit: Payer: Self-pay | Admitting: Psychiatry

## 2021-05-14 DIAGNOSIS — F319 Bipolar disorder, unspecified: Secondary | ICD-10-CM

## 2021-05-14 NOTE — Telephone Encounter (Signed)
-   Please ask her why she wants prolactin level? Does she have any symptoms such as nipple discharge, or irregular menstrual cycle? Although we can check it if she wants (and order is sent in at [facility] lab- please print it out and fax it to them if needed), we do not necessary need to check it unless she has any of the symptoms as above.  - Please also  asks her if she is taking Depakote 750 mg at night as discussed lately.  - If she is taking the dose above, please advise her to get depakote level after taking this dose more than five days.  Order was sent already, and I believe you faxed it to lab at [facility] already. Thanks.

## 2021-05-14 NOTE — Telephone Encounter (Signed)
pt states she needs her labwork order in the system for her prolactin level.

## 2021-05-14 NOTE — Telephone Encounter (Signed)
left message to call office

## 2021-05-15 ENCOUNTER — Other Ambulatory Visit
Admission: RE | Admit: 2021-05-15 | Discharge: 2021-05-15 | Disposition: A | Discharge status: Home / Self Care | Payer: 59 | Attending: Psychiatry | Admitting: Psychiatry

## 2021-05-15 DIAGNOSIS — F319 Bipolar disorder, unspecified: Secondary | ICD-10-CM | POA: Insufficient documentation

## 2021-05-15 DIAGNOSIS — F310 Bipolar disorder, current episode hypomanic: Secondary | ICD-10-CM | POA: Diagnosis not present

## 2021-05-15 LAB — HEPATIC FUNCTION PANEL
ALT: 23 U/L (ref 0–44)
AST: 22 U/L (ref 15–41)
Albumin: 3.8 g/dL (ref 3.5–5.0)
Alkaline Phosphatase: 76 U/L (ref 38–126)
Bilirubin, Direct: <0.1 mg/dL (ref 0.0–0.2)
Total Bilirubin: 0.5 mg/dL (ref 0.3–1.2)
Total Protein: 7.1 g/dL (ref 6.5–8.1)

## 2021-05-15 LAB — VALPROIC ACID LEVEL: Valproic Acid Lvl: 60 ug/mL (ref 50.0–100.0)

## 2021-05-17 ENCOUNTER — Encounter: Payer: Self-pay | Admitting: Psychiatry

## 2021-05-24 ENCOUNTER — Other Ambulatory Visit: Payer: Self-pay

## 2021-05-25 ENCOUNTER — Other Ambulatory Visit: Payer: Self-pay

## 2021-05-26 ENCOUNTER — Ambulatory Visit: Payer: 59

## 2021-05-26 ENCOUNTER — Other Ambulatory Visit: Payer: Self-pay

## 2021-05-26 NOTE — Progress Notes (Unsigned)
Patient in too early for Abilify Maintena 400 mg IM injection as she last received 05/03/21.  Met with Dr. Shea Evans to discuss and patient agreed to return on 06/01/21 for the due injection.  Patient okay with waiting until that date and reported no current problems with Abilify Maintena.  Patient does want to discuss possibly coming off or lowering dosage of Depakote at next appointment with Dr. Modesta Messing.

## 2021-06-01 ENCOUNTER — Ambulatory Visit (INDEPENDENT_AMBULATORY_CARE_PROVIDER_SITE_OTHER): Payer: 59

## 2021-06-01 ENCOUNTER — Other Ambulatory Visit: Payer: Self-pay

## 2021-06-01 ENCOUNTER — Ambulatory Visit: Payer: 59

## 2021-06-01 DIAGNOSIS — F310 Bipolar disorder, current episode hypomanic: Secondary | ICD-10-CM | POA: Diagnosis not present

## 2021-06-01 NOTE — Progress Notes (Signed)
Patient in today for due Abilify Maintena 400 mg IM injection.  Patient denied any auditory or visual hallucinations, no thoughts of wanting to harm herself of others and mood level. Patient requested to have the due injection in her left shoulder.  Injection prepared as ordered and given to patient in her requested left deltoid area.  Patient tolerated due injection without complaint of pain or discomfort.  Patient to return in 28 days for the next due Abilify Maintena 400 mg IM injection. Patient to call if any issues prior to then.
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
## 2021-06-21 ENCOUNTER — Ambulatory Visit: Payer: 59 | Admitting: Psychiatry

## 2021-06-21 ENCOUNTER — Ambulatory Visit: Payer: 59

## 2021-06-22 ENCOUNTER — Telehealth: Payer: Self-pay | Admitting: *Deleted

## 2021-06-22 NOTE — Telephone Encounter (Signed)
Olivia to [employer] at [phone]

## 2021-06-28 ENCOUNTER — Other Ambulatory Visit: Payer: Self-pay

## 2021-06-28 ENCOUNTER — Other Ambulatory Visit: Payer: Self-pay | Admitting: Psychiatry

## 2021-06-28 MED ORDER — DIVALPROEX SODIUM 250 MG PO DR TAB
250.0000 mg | DELAYED_RELEASE_TABLET | Freq: Every day | ORAL | 0 refills | Status: DC
  Filled 2021-06-28: qty 30, 30d supply, fill #0

## 2021-06-29 ENCOUNTER — Ambulatory Visit: Payer: 59

## 2021-06-29 ENCOUNTER — Other Ambulatory Visit: Payer: Self-pay

## 2021-06-29 ENCOUNTER — Ambulatory Visit (INDEPENDENT_AMBULATORY_CARE_PROVIDER_SITE_OTHER): Payer: 59

## 2021-06-29 DIAGNOSIS — F3113 Bipolar disorder, current episode manic without psychotic features, severe: Secondary | ICD-10-CM

## 2021-06-29 DIAGNOSIS — F310 Bipolar disorder, current episode hypomanic: Secondary | ICD-10-CM | POA: Diagnosis not present

## 2021-06-29 NOTE — Progress Notes (Signed)
Patient in today for due Abilify Maintena 400 mg IM injection.  Patient denied any auditory or visual hallucinations, no thoughts of wanting to harm herself of others and mood level.   Patient requested to have the due injection in her left shoulder.  Injection prepared as ordered and given to patient in her requested left deltoid area.  Patient tolerated due injection without complaint of pain or discomfort.    Patient to return in 28 days for the next due Abilify Maintena 400 mg IM injection. Patient to call if any issues prior to then.  Pt did states that she was going to speak with doctor to see if she can be on the pills only instead of injections.
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
## 2021-07-07 ENCOUNTER — Other Ambulatory Visit: Payer: Self-pay

## 2021-07-07 ENCOUNTER — Telehealth: Payer: Self-pay | Admitting: Family

## 2021-07-07 NOTE — Telephone Encounter (Signed)
LMTCB

## 2021-07-07 NOTE — Telephone Encounter (Signed)
Pt called in requesting for medication (Carbidopa) to be prescribe to her. Pt stated that the medication is for her restless legs. Pt requesting callback.

## 2021-07-07 NOTE — Telephone Encounter (Signed)
Patient scheduled on Monday to discuss.

## 2021-07-08 ENCOUNTER — Other Ambulatory Visit: Payer: Self-pay

## 2021-07-08 ENCOUNTER — Encounter: Payer: Self-pay | Admitting: Psychiatry

## 2021-07-08 ENCOUNTER — Ambulatory Visit (INDEPENDENT_AMBULATORY_CARE_PROVIDER_SITE_OTHER): Payer: 59 | Admitting: Psychiatry

## 2021-07-08 VITALS — BP 149/82 | HR 93 | Temp 97.8°F | Wt 228.4 lb

## 2021-07-08 DIAGNOSIS — G2581 Restless legs syndrome: Secondary | ICD-10-CM | POA: Diagnosis not present

## 2021-07-08 DIAGNOSIS — F419 Anxiety disorder, unspecified: Secondary | ICD-10-CM

## 2021-07-08 DIAGNOSIS — F4310 Post-traumatic stress disorder, unspecified: Secondary | ICD-10-CM | POA: Diagnosis not present

## 2021-07-08 DIAGNOSIS — F319 Bipolar disorder, unspecified: Secondary | ICD-10-CM | POA: Diagnosis not present

## 2021-07-08 MED ORDER — ARIPIPRAZOLE 2 MG PO TABS
2.0000 mg | ORAL_TABLET | Freq: Every day | ORAL | 0 refills | Status: DC
  Filled 2021-07-08: qty 30, 30d supply, fill #0

## 2021-07-08 NOTE — Patient Instructions (Signed)
Continue Abilify Maintena monthly injection Decrease Abilify 2 mg daily (after finishing 5 mg tablet, taking 2.5 mg per day) Continue Depakote 750 mg at night  Next appointment: 3/2 at 8:30, in person

## 2021-07-12 ENCOUNTER — Ambulatory Visit: Payer: 59 | Admitting: Family

## 2021-07-12 ENCOUNTER — Encounter: Payer: Self-pay | Admitting: Family

## 2021-07-12 ENCOUNTER — Other Ambulatory Visit: Payer: Self-pay

## 2021-07-12 VITALS — BP 108/64 | HR 89 | Temp 98.7°F | Ht 62.25 in | Wt 229.6 lb

## 2021-07-12 DIAGNOSIS — G2581 Restless legs syndrome: Secondary | ICD-10-CM | POA: Diagnosis not present

## 2021-07-12 LAB — CBC WITH DIFFERENTIAL/PLATELET
Basophils Absolute: 0.1 10*3/uL (ref 0.0–0.1)
Basophils Relative: 0.6 % (ref 0.0–3.0)
Eosinophils Absolute: 0.1 10*3/uL (ref 0.0–0.7)
Eosinophils Relative: 0.7 % (ref 0.0–5.0)
HCT: 39.9 % (ref 36.0–46.0)
Hemoglobin: 13.2 g/dL (ref 12.0–15.0)
Lymphocytes Relative: 14.5 % (ref 12.0–46.0)
Lymphs Abs: 2.5 10*3/uL (ref 0.7–4.0)
MCHC: 33 g/dL (ref 30.0–36.0)
MCV: 88.4 fl (ref 78.0–100.0)
Monocytes Absolute: 1 10*3/uL (ref 0.1–1.0)
Monocytes Relative: 5.8 % (ref 3.0–12.0)
Neutro Abs: 13.5 10*3/uL — ABNORMAL HIGH (ref 1.4–7.7)
Neutrophils Relative %: 78.4 % — ABNORMAL HIGH (ref 43.0–77.0)
Platelets: 305 10*3/uL (ref 150.0–400.0)
RBC: 4.52 Mil/uL (ref 3.87–5.11)
RDW: 14.4 % (ref 11.5–15.5)
WBC: 17.2 10*3/uL — ABNORMAL HIGH (ref 4.0–10.5)

## 2021-07-12 LAB — IBC + FERRITIN
Ferritin: 31.9 ng/mL (ref 10.0–291.0)
Iron: 92 ug/dL (ref 42–145)
Saturation Ratios: 23.4 % (ref 20.0–50.0)
TIBC: 393.4 ug/dL (ref 250.0–450.0)
Transferrin: 281 mg/dL (ref 212.0–360.0)

## 2021-07-12 MED ORDER — PRAMIPEXOLE DIHYDROCHLORIDE 0.125 MG PO TABS
0.1250 mg | ORAL_TABLET | Freq: Every evening | ORAL | 1 refills | Status: DC
  Filled 2021-07-12: qty 60, 60d supply, fill #0

## 2021-07-12 NOTE — Progress Notes (Signed)
Subjective:    Patient ID: [name], female    DOB: Jun 28, 1970, 51 y.o.   MRN: [id number]  CC: [name] is a 51 y.o. female who presents today for follow up.   HPI: She complains of 'wiggling' in both legs, for the past 25 years.  Occurs nightly.  Interrupts her sleep and she will get up and move legs until she eventually falls asleep. Laying down seems to help.  No leg swelling, numbness.  She has tried to stop caffeine with relief of symptoms. She has tried requip , mirapex, gabapentin years ago without relief.  She has seen neurology in the past whom started her on gabapentin.   She is taking ferrous sulfate 325 mg twice daily started 3 days ago.  She is noted improvement in restless leg symptoms     History of bipolar-continues to follow with psychiatry, Dr. Modesta Patrick.  Ferritin 25 two months ago Hepatic function panel normal 1 month ago   HISTORY:  Past Medical History:  Diagnosis Date   Akathisia 12/17/2014   Bipolar affective (HCC)    Restless leg syndrome    Schizoaffective disorder (HCC)    Schizoaffective disorder (HCC)    Past Surgical History:  Procedure Laterality Date   APPENDECTOMY     CESAREAN SECTION     COLONOSCOPY WITH PROPOFOL N/A 01/28/2021   Procedure: COLONOSCOPY WITH PROPOFOL;  Surgeon: Brittany Lame, MD;  Location: [facility] END[facility]COPY;  Service: Endoscopy;  Laterality: N/A;   LAPAROSCOPIC APPENDECTOMY N/A 07/02/2020   Procedure: APPENDECTOMY LAPAROSCOPIC;  Surgeon: Brittany Maudlin, MD;  Location: [facility] ORS;  Service: General;  Laterality: N/A;   Family History  Problem Relation Age of Onset   Drug abuse Maternal Aunt    Suicidality Cousin     Allergies: Patient has no known allergies. Current Outpatient Medications on File Prior to Visit  Medication Sig Dispense Refill   [START ON 07/15/2021] ARIPiprazole (ABILIFY) 2 MG tablet Take 1 tablet (2 mg total) by mouth daily. 30 tablet 0   ARIPiprazole ER (ABILIFY MAINTENA) 400 MG PRSY prefilled  syringe Inject 400 mg into the muscle every 28 (twenty-eight) days for 6 doses. 1 each 5   divalproex (DEPAKOTE) 250 MG DR tablet Take 1 tablet (250 mg total) by mouth at bedtime. Take a total of 750 mg, take along with 500 mg tab 30 tablet 0   divalproex (DEPAKOTE) 500 MG DR tablet Take 1 tablet (500 mg total) by mouth at bedtime. 30 tablet 0   estradiol (ESTRACE) 0.1 MG/GM vaginal cream Place 0.5 g vaginally twice a week 30 g 3   estradiol (ESTRACE) 0.1 MG/GM vaginal cream Place vaginally.     ferrous sulfate 325 (65 FE) MG tablet Take 325 mg by mouth 2 (two) times daily with a meal.     Current Facility-Administered Medications on File Prior to Visit  Medication Dose Route Frequency Provider Last Rate Last Admin   ARIPiprazole ER (ABILIFY MAINTENA) 400 MG prefilled syringe 400 mg  400 mg Intramuscular Q28 days Brittany Clay, MD   400 mg at 06/29/21 1325    Social History   Tobacco Use   Smoking status: Every Day    Packs/day: 0.50    Years: 10.00    Pack years: 5.00    Types: Cigarettes   Smokeless tobacco: Never   Tobacco comments:    Patient stated she is thinking about quitting and in contemplation..   Vaping Use   Vaping Use: Former  Substance Use Topics  Alcohol use: No   Drug use: No    Review of Systems  Constitutional:  Negative for chills and fever.  Respiratory:  Negative for cough.   Cardiovascular:  Negative for chest pain, palpitations and leg swelling.  Gastrointestinal:  Negative for nausea and vomiting.  Musculoskeletal:  Negative for arthralgias.  Neurological:  Negative for numbness.     Objective:    BP 108/64 (BP Location: Left Arm, Patient Position: Sitting, Cuff Size: Large)    Pulse 89    Temp 98.7 F (37.1 C) (Oral)    Ht 5' 2.25" (1.581 m)    Wt 229 lb 9.6 oz (104.1 kg)    LMP 12/17/2014    SpO2 97%    BMI 41.66 kg/m  BP Readings from Last 3 Encounters:  07/12/21 108/64  01/28/21 102/60  01/27/21 122/80   Wt Readings from Last 3 Encounters:   07/12/21 229 lb 9.6 oz (104.1 kg)  01/28/21 218 lb 7.3 oz (99.1 kg)  01/27/21 217 lb 12.8 oz (98.8 kg)    Physical Exam Vitals reviewed.  Constitutional:      Appearance: She is well-developed.  Eyes:     Conjunctiva/sclera: Conjunctivae normal.  Cardiovascular:     Rate and Rhythm: Normal rate and regular rhythm.     Pulses: Normal pulses.     Heart sounds: Normal heart sounds.  Pulmonary:     Effort: Pulmonary effort is normal.     Breath sounds: Normal breath sounds. No wheezing, rhonchi or rales.  Musculoskeletal:     Right lower leg: No edema.     Left lower leg: No edema.  Skin:    General: Skin is warm and dry.  Neurological:     Mental Status: She is alert.  Psychiatric:        Speech: Speech normal.        Behavior: Behavior normal.        Thought Content: Thought content normal.       Assessment & Plan:   Problem List Items Addressed This Visit       Other   Restless leg syndrome - Primary    Uncontrolled. She started ferritin 325 mg twice daily 3 days ago.  Advised goal of ferritin to be equal or greater to 75.  Checking iron stores today and again in 2 months time.  Start Mirapex 0.125 every afternoon.  Close follow up      Relevant Medications   pramipexole (MIRAPEX) 0.125 MG tablet   Other Relevant Orders   IBC + Ferritin   CBC with Differential/Platelet     I have discontinued [name]'s metFORMIN and Clenpiq. I am also having her start on pramipexole. Additionally, I am having her maintain her divalproex, Abilify Maintena, estradiol, estradiol, divalproex, ARIPiprazole, and ferrous sulfate. We will continue to administer ARIPiprazole ER.   Meds ordered this encounter  Medications   pramipexole (MIRAPEX) 0.125 MG tablet    Sig: Take 1 tablet (0.125 mg total) by mouth every evening.    Dispense:  60 tablet    Refill:  1    Order Specific Question:   Supervising Provider    Answer:   [name] [[id number]]    Return precautions  given.   Risks, benefits, and alternatives of the medications and treatment plan prescribed today were discussed, and patient expressed understanding.   Education regarding symptom management and diagnosis given to patient on AVS.  Continue to follow with Brittany Hawthorne, FNP for routine  health maintenance.   [name] and I agreed with plan.   Mable Paris, FNP

## 2021-07-12 NOTE — Assessment & Plan Note (Addendum)
Uncontrolled. She started ferritin 325 mg twice daily 3 days ago.  Advised goal of ferritin to be equal or greater to 75.  Checking iron stores today and again in 2 months time.  Start Mirapex 0.125 every afternoon.  Close follow up

## 2021-07-12 NOTE — Patient Instructions (Signed)
Continue ferrous sulfate 325 mg twice daily.  Please start Colace proactively to prevent constipation. Please start Mirapex 0.125 mg at bedtime.

## 2021-07-13 ENCOUNTER — Other Ambulatory Visit: Payer: Self-pay

## 2021-07-13 DIAGNOSIS — R899 Unspecified abnormal finding in specimens from other organs, systems and tissues: Secondary | ICD-10-CM

## 2021-07-26 ENCOUNTER — Other Ambulatory Visit: Payer: Self-pay

## 2021-07-26 ENCOUNTER — Other Ambulatory Visit: Payer: Self-pay | Admitting: Psychiatry

## 2021-07-26 DIAGNOSIS — F319 Bipolar disorder, unspecified: Secondary | ICD-10-CM

## 2021-07-26 DIAGNOSIS — F419 Anxiety disorder, unspecified: Secondary | ICD-10-CM

## 2021-07-26 MED ORDER — DIVALPROEX SODIUM 250 MG PO DR TAB
250.0000 mg | DELAYED_RELEASE_TABLET | Freq: Every day | ORAL | 0 refills | Status: DC
  Filled 2021-07-26: qty 30, 30d supply, fill #0

## 2021-08-02 ENCOUNTER — Ambulatory Visit (INDEPENDENT_AMBULATORY_CARE_PROVIDER_SITE_OTHER): Payer: 59

## 2021-08-02 ENCOUNTER — Other Ambulatory Visit: Payer: Self-pay

## 2021-08-02 ENCOUNTER — Other Ambulatory Visit: Payer: Self-pay | Admitting: Psychiatry

## 2021-08-02 DIAGNOSIS — F319 Bipolar disorder, unspecified: Secondary | ICD-10-CM

## 2021-08-02 DIAGNOSIS — F419 Anxiety disorder, unspecified: Secondary | ICD-10-CM

## 2021-08-02 MED ORDER — DIVALPROEX SODIUM 500 MG PO DR TAB
500.0000 mg | DELAYED_RELEASE_TABLET | Freq: Every day | ORAL | 0 refills | Status: DC
  Filled 2021-08-02: qty 30, 30d supply, fill #0
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
## 2021-08-03 ENCOUNTER — Other Ambulatory Visit: Payer: Self-pay

## 2021-08-04 ENCOUNTER — Ambulatory Visit (INDEPENDENT_AMBULATORY_CARE_PROVIDER_SITE_OTHER): Payer: 59

## 2021-08-04 ENCOUNTER — Other Ambulatory Visit: Payer: Self-pay

## 2021-08-04 DIAGNOSIS — F3113 Bipolar disorder, current episode manic without psychotic features, severe: Secondary | ICD-10-CM

## 2021-08-04 DIAGNOSIS — F310 Bipolar disorder, current episode hypomanic: Secondary | ICD-10-CM

## 2021-08-04 NOTE — Progress Notes (Signed)
1 

## 2021-08-04 NOTE — Patient Instructions (Signed)
Patient in today for due Abilify Maintena 400 mg IM injection.  Patient denied any auditory or visual hallucinations, no thoughts of wanting to harm herself of others and mood level.  ?  ?Patient requested to have the due injection in her right deltoid.  Injection prepared as ordered and given to patient in her requested right deltoid area.  Patient tolerated due injection without complaint of pain or discomfort.   ?  ?Patient to return in 28 days for the next due Abilify Maintena 400 mg IM injection. Patient to call if any issues prior to then.  ?

## 2021-08-05 ENCOUNTER — Encounter: Payer: Self-pay | Admitting: Psychiatry

## 2021-08-05 ENCOUNTER — Ambulatory Visit (INDEPENDENT_AMBULATORY_CARE_PROVIDER_SITE_OTHER): Payer: 59 | Admitting: Psychiatry

## 2021-08-05 ENCOUNTER — Other Ambulatory Visit: Payer: Self-pay

## 2021-08-05 VITALS — BP 132/83 | HR 96 | Temp 97.5°F | Wt 226.0 lb

## 2021-08-05 DIAGNOSIS — F4310 Post-traumatic stress disorder, unspecified: Secondary | ICD-10-CM

## 2021-08-05 DIAGNOSIS — G2581 Restless legs syndrome: Secondary | ICD-10-CM

## 2021-08-05 DIAGNOSIS — F419 Anxiety disorder, unspecified: Secondary | ICD-10-CM

## 2021-08-05 DIAGNOSIS — F319 Bipolar disorder, unspecified: Secondary | ICD-10-CM | POA: Diagnosis not present

## 2021-08-05 MED ORDER — DIVALPROEX SODIUM 250 MG PO DR TAB
250.0000 mg | DELAYED_RELEASE_TABLET | Freq: Every day | ORAL | 1 refills | Status: DC
  Filled 2021-08-05 – 2021-08-26 (×2): qty 30, 30d supply, fill #0

## 2021-08-05 MED ORDER — DIVALPROEX SODIUM 500 MG PO DR TAB
500.0000 mg | DELAYED_RELEASE_TABLET | Freq: Every day | ORAL | 0 refills | Status: DC
  Filled 2021-08-05 – 2021-08-26 (×2): qty 30, 30d supply, fill #0

## 2021-08-05 NOTE — Patient Instructions (Signed)
Continue Abilify Maintena monthly injection ?Discontinue Abilify 2 mg after a month  ?Continue Depakote 750 mg at night  ?Obtain labs (LFT, VPA) ?Next appointment: 5/9 at 2 PM  ?

## 2021-08-26 ENCOUNTER — Other Ambulatory Visit: Payer: Self-pay

## 2021-08-30 ENCOUNTER — Other Ambulatory Visit: Payer: Self-pay

## 2021-08-31 ENCOUNTER — Other Ambulatory Visit: Payer: Self-pay

## 2021-09-01 ENCOUNTER — Ambulatory Visit (INDEPENDENT_AMBULATORY_CARE_PROVIDER_SITE_OTHER): Payer: 59

## 2021-09-01 DIAGNOSIS — F319 Bipolar disorder, unspecified: Secondary | ICD-10-CM | POA: Diagnosis not present

## 2021-09-01 NOTE — Progress Notes (Deleted)
Pt presented for her due Abilify Maintena '400mg'$  injection. Patient was very pleasant and cooperative upon approach. Medication was prepared and administered as ordered in LEFT DELTOID. Patient to return in 28 days for next due injection ?

## 2021-09-01 NOTE — Patient Instructions (Signed)
Patient presented today for due Abilify Maintena '400mg'$  injection. Patient was very pleasant and cooperative upon approach. Medication was prepared and administered as ordered in LEFT DELTOID. Patient to return in 28 days for next due injection. ?

## 2021-09-01 NOTE — Progress Notes (Signed)
Pt presented for her due Abilify Maintena '400mg'$  injection. Patient was very pleasant and cooperative upon approach. Medication was prepared and administered as ordered in LEFT DELTOID. Patient to return in 28 days for next due injection ?

## 2021-09-09 ENCOUNTER — Encounter: Payer: Self-pay | Admitting: Family

## 2021-09-09 ENCOUNTER — Ambulatory Visit: Payer: 59 | Admitting: Family

## 2021-09-09 VITALS — BP 124/78 | HR 72 | Temp 98.1°F | Ht 62.0 in | Wt 218.4 lb

## 2021-09-09 DIAGNOSIS — F310 Bipolar disorder, current episode hypomanic: Secondary | ICD-10-CM

## 2021-09-09 DIAGNOSIS — F3113 Bipolar disorder, current episode manic without psychotic features, severe: Secondary | ICD-10-CM | POA: Diagnosis not present

## 2021-09-09 DIAGNOSIS — G2581 Restless legs syndrome: Secondary | ICD-10-CM

## 2021-09-09 NOTE — Assessment & Plan Note (Signed)
Chronic, stable.  Dr. Modesta Messing has discontinued Abilify p.o.  She will continue to follow-up with Dr. Modesta Messing.  She is compliant with Depakote.  Pending valproic acid level today.  She will continue follow-up with Dr. Modesta Messing  ?

## 2021-09-09 NOTE — Assessment & Plan Note (Signed)
Improved.  Pending ferritin stores today.  If ferritin close to 75 will ask patient to stop taking iron supplementation.  Currently she is taking ferrous sulfate 325 3 tablets every other day.  She will continue to use pramipexole 0.'125mg'$  prn .  ?

## 2021-09-09 NOTE — Progress Notes (Signed)
Patient has been taking 3 iron pills on designated days instead of the 1 that she is supposed to be taking!! She stated that "they" told her her iron was low. Would like for you to check it. ?

## 2021-09-09 NOTE — Progress Notes (Signed)
? ?Subjective:  ? ? Patient ID: [name], female    DOB: 07-Apr-1971, 51 y.o.   MRN: [id number] ? ?CC: [name] is a 51 y.o. female who presents today for follow up.  ? ?HPI: Follow-up restless leg syndrome ?Symptoms have improved with iron supplementation. she has been taking ferrous sulfate 3 tablets QOD.  ?Compliant with pramipexole 0.'125mg'$  only as needed. ?  ?Abilify is being tapered by Dr Modesta Messing to see if RLS improves.  ? ?Ferritin 31 one month ago ? ?She is eating more protein and less carbs. She has lost 17 lbs. ? ? ? ?She does endorse recent stress at work and she will likely start looking for a new employment.  She is interested in speaking with a counselor. ?Follow up with Dr Modesta Messing 08/05/21 for bipolar, PTSD; continue Abilify, Depakote ?HISTORY:  ?Past Medical History:  ?Diagnosis Date  ? Akathisia 12/17/2014  ? Bipolar affective ([place])   ? Restless leg syndrome   ? Schizoaffective disorder ([place])   ? Schizoaffective disorder ([place])   ? ?Past Surgical History:  ?Procedure Laterality Date  ? APPENDECTOMY    ? CESAREAN SECTION    ? COLONOSCOPY WITH PROPOFOL N/A 01/28/2021  ? Procedure: COLONOSCOPY WITH PROPOFOL;  Surgeon: [name], MD;  Location: [facility] ENDOSCOPY;  Service: Endoscopy;  Laterality: N/A;  ? LAPAROSCOPIC APPENDECTOMY N/A 07/02/2020  ? Procedure: APPENDECTOMY LAPAROSCOPIC;  Surgeon: Fredirick Maudlin, MD;  Location: [facility] ORS;  Service: General;  Laterality: N/A;  ? ?Family History  ?Problem Relation Age of Onset  ? Drug abuse Maternal Aunt   ? Suicidality Cousin   ? ? ?Allergies: Patient has no known allergies. ?Current Outpatient Medications on File Prior to Visit  ?Medication Sig Dispense Refill  ? ARIPiprazole ER (ABILIFY MAINTENA) 400 MG PRSY prefilled syringe Inject 400 mg into the muscle every 28 (twenty-eight) days for 6 doses. 1 each 5  ? divalproex (DEPAKOTE) 250 MG DR tablet Take 1 tablet (250 mg total) by mouth at bedtime. Take a total of 750 mg, take along with 500 mg tab 30  tablet 1  ? divalproex (DEPAKOTE) 500 MG DR tablet Take 1 tablet (500 mg total) by mouth at bedtime. Take total of 750 mg. Take along with 250 mg tab 30 tablet 0  ? estradiol (ESTRACE) 0.1 MG/GM vaginal cream Place 0.5 g vaginally twice a week 30 g 3  ? estradiol (ESTRACE) 0.1 MG/GM vaginal cream Place vaginally.    ? ferrous sulfate 325 (65 FE) MG tablet Take 325 mg by mouth every other day.    ? pramipexole (MIRAPEX) 0.125 MG tablet Take 1 tablet (0.125 mg total) by mouth every evening. 60 tablet 1  ? ?No current facility-administered medications on file prior to visit.  ? ? ?Social History  ? ?Tobacco Use  ? Smoking status: Every Day  ?  Packs/day: 0.50  ?  Years: 10.00  ?  Pack years: 5.00  ?  Types: Cigarettes  ? Smokeless tobacco: Never  ? Tobacco comments:  ?  Patient stated she is thinking about quitting and in contemplation.Marland Kitchen   ?Vaping Use  ? Vaping Use: Former  ?Substance Use Topics  ? Alcohol use: No  ? Drug use: No  ? ? ?Review of Systems  ?Constitutional:  Negative for chills and fever.  ?Respiratory:  Negative for cough.   ?Cardiovascular:  Negative for chest pain and palpitations.  ?Gastrointestinal:  Negative for nausea and vomiting.  ?   ?Objective:  ?  ?BP 124/78 (  BP Location: Left Arm, Patient Position: Sitting, Cuff Size: Normal)   Pulse 72   Temp 98.1 ?F (36.7 ?C) (Oral)   Ht '5\' 2"'$  (1.575 m)   Wt 218 lb 6.4 oz (99.1 kg)   LMP 12/17/2014   SpO2 95%   BMI 39.95 kg/m?  ?BP Readings from Last 3 Encounters:  ?09/09/21 124/78  ?09/01/21 101/69  ?08/05/21 132/83  ? ?Wt Readings from Last 3 Encounters:  ?09/09/21 218 lb 6.4 oz (99.1 kg)  ?09/01/21 221 lb (100.2 kg)  ?08/05/21 226 lb (102.5 kg)  ? ? ?Physical Exam ?Vitals reviewed.  ?Constitutional:   ?   Appearance: She is well-developed.  ?Eyes:  ?   Conjunctiva/sclera: Conjunctivae normal.  ?Cardiovascular:  ?   Rate and Rhythm: Normal rate and regular rhythm.  ?   Pulses: Normal pulses.  ?   Heart sounds: Normal heart sounds.  ?Pulmonary:  ?    Effort: Pulmonary effort is normal.  ?   Breath sounds: Normal breath sounds. No wheezing, rhonchi or rales.  ?Skin: ?   General: Skin is warm and dry.  ?Neurological:  ?   Mental Status: She is alert.  ?Psychiatric:     ?   Speech: Speech normal.     ?   Behavior: Behavior normal.     ?   Thought Content: Thought content normal.  ? ? ?   ?Assessment & Plan:  ? ?Problem List Items Addressed This Visit   ? ?  ? Other  ? Bipolar affective ([place])  ? Relevant Orders  ? Hepatic function panel  ? Valproic Acid level  ? Ambulatory referral to Psychology  ? Bipolar affective disorder, current episode manic ([place])  ?  Chronic, stable.  Dr. Modesta Messing has discontinued Abilify p.o.  She will continue to follow-up with Dr. Modesta Messing.  She is compliant with Depakote.  Pending valproic acid level today.  She will continue follow-up with Dr. Modesta Messing  ?  ?  ? Restless leg syndrome - Primary  ?  Improved.  Pending ferritin stores today.  If ferritin close to 75 will ask patient to stop taking iron supplementation.  Currently she is taking ferrous sulfate 325 3 tablets every other day.  She will continue to use pramipexole 0.'125mg'$  prn .  ?  ?  ? Relevant Orders  ? CBC with Differential/Platelet  ? Iron, TIBC and Ferritin Panel  ? ? ? ?I have discontinued Jamela P. Waller's ARIPiprazole. I am also having her maintain her Abilify Maintena, estradiol, estradiol, ferrous sulfate, pramipexole, divalproex, and divalproex. ? ? ?No orders of the defined types were placed in this encounter. ? ? ?Return precautions given.  ? ?Risks, benefits, and alternatives of the medications and treatment plan prescribed today were discussed, and patient expressed understanding.  ? ?Education regarding symptom management and diagnosis given to patient on AVS. ? ?Continue to follow with Burnard Hawthorne, FNP for routine health maintenance.  ? ?Mercer Pod and I agreed with plan.  ? ?Mable Paris, FNP ? ? ?

## 2021-09-10 LAB — HEPATIC FUNCTION PANEL
AG Ratio: 1.7 (calc) (ref 1.0–2.5)
ALT: 19 U/L (ref 6–29)
AST: 15 U/L (ref 10–35)
Albumin: 4.5 g/dL (ref 3.6–5.1)
Alkaline phosphatase (APISO): 72 U/L (ref 37–153)
Bilirubin, Direct: 0.1 mg/dL (ref 0.0–0.2)
Globulin: 2.7 g/dL (calc) (ref 1.9–3.7)
Indirect Bilirubin: 0.2 mg/dL (calc) (ref 0.2–1.2)
Total Bilirubin: 0.3 mg/dL (ref 0.2–1.2)
Total Protein: 7.2 g/dL (ref 6.1–8.1)

## 2021-09-10 LAB — CBC WITH DIFFERENTIAL/PLATELET
Absolute Monocytes: 679 cells/uL (ref 200–950)
Basophils Absolute: 77 cells/uL (ref 0–200)
Basophils Relative: 0.9 %
Eosinophils Absolute: 198 cells/uL (ref 15–500)
Eosinophils Relative: 2.3 %
HCT: 42.1 % (ref 35.0–45.0)
Hemoglobin: 13.7 g/dL (ref 11.7–15.5)
Lymphs Abs: 3345 cells/uL (ref 850–3900)
MCH: 28.9 pg (ref 27.0–33.0)
MCHC: 32.5 g/dL (ref 32.0–36.0)
MCV: 88.8 fL (ref 80.0–100.0)
MPV: 12.7 fL — ABNORMAL HIGH (ref 7.5–12.5)
Monocytes Relative: 7.9 %
Neutro Abs: 4300 cells/uL (ref 1500–7800)
Neutrophils Relative %: 50 %
Platelets: 301 10*3/uL (ref 140–400)
RBC: 4.74 10*6/uL (ref 3.80–5.10)
RDW: 13.1 % (ref 11.0–15.0)
Total Lymphocyte: 38.9 %
WBC: 8.6 10*3/uL (ref 3.8–10.8)

## 2021-09-10 LAB — IRON,TIBC AND FERRITIN PANEL
%SAT: 12 % (calc) — ABNORMAL LOW (ref 16–45)
Ferritin: 52 ng/mL (ref 16–232)
Iron: 45 ug/dL (ref 45–160)
TIBC: 379 mcg/dL (calc) (ref 250–450)

## 2021-09-10 LAB — VALPROIC ACID LEVEL: Valproic Acid Lvl: 30.7 mg/L — ABNORMAL LOW (ref 50.0–100.0)

## 2021-09-13 ENCOUNTER — Ambulatory Visit: Payer: 59 | Admitting: Family

## 2021-09-17 ENCOUNTER — Other Ambulatory Visit: Payer: Self-pay

## 2021-09-17 ENCOUNTER — Other Ambulatory Visit: Payer: Self-pay | Admitting: Psychiatry

## 2021-09-17 MED ORDER — ABILIFY MAINTENA 400 MG IM PRSY
400.0000 mg | PREFILLED_SYRINGE | INTRAMUSCULAR | 5 refills | Status: DC
Start: 2021-09-17 — End: 2022-01-04
  Filled 2021-09-17: qty 1, 28d supply, fill #0
  Filled 2021-10-23: qty 1, 28d supply, fill #1
  Filled 2021-11-22: qty 1, 28d supply, fill #2
  Filled 2021-12-17: qty 1, 28d supply, fill #3

## 2021-09-20 ENCOUNTER — Other Ambulatory Visit: Payer: Self-pay

## 2021-09-26 IMAGING — US US BREAST*R* LIMITED INC AXILLA
1 series · 5 of 5 positions shown · non-contrast
Comparison: Previous exam(s).

ACR Breast Density Category a: The breast tissue is almost entirely
fatty.

CLINICAL DATA: Callback for bilateral masses from new baseline exam



[Series 1: us breast*right* limited inc axilla · 0.06mm/px · 5 of 5 slices shown]
[im 1/5]
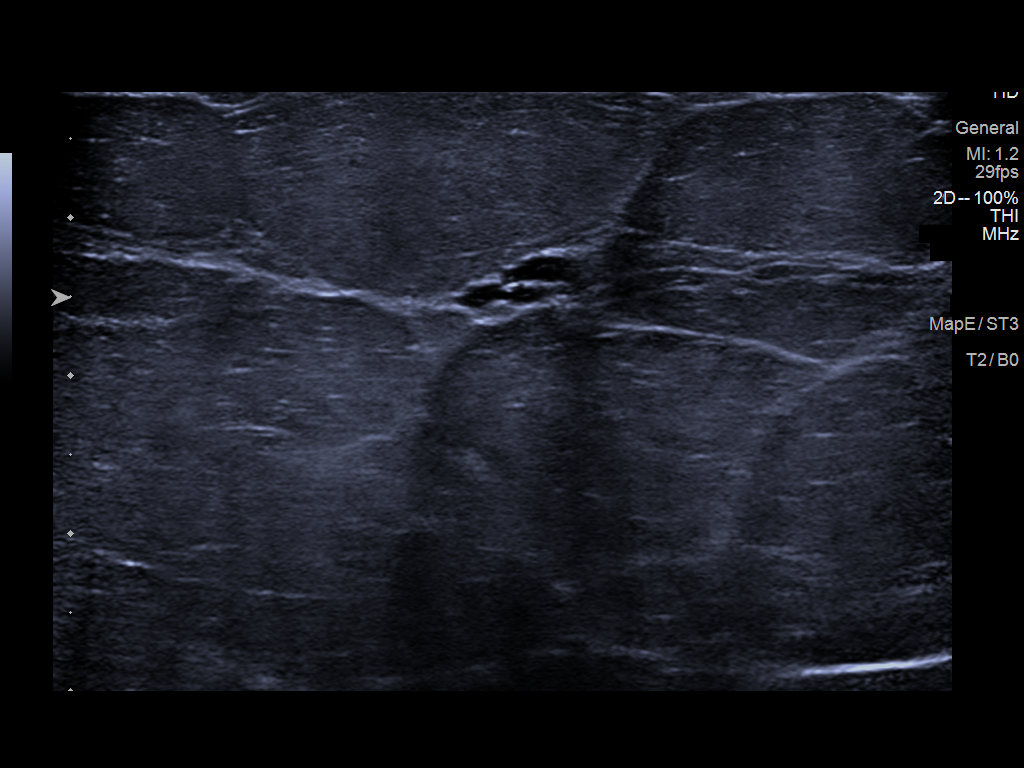
[im 2/5]
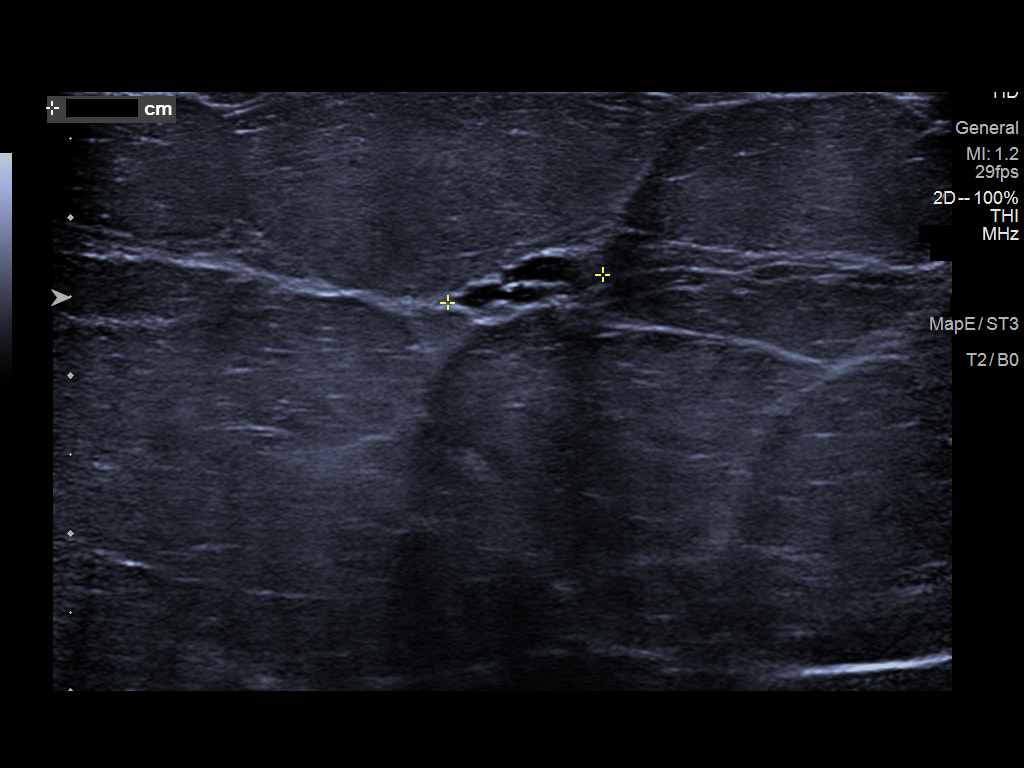
[im 3/5]
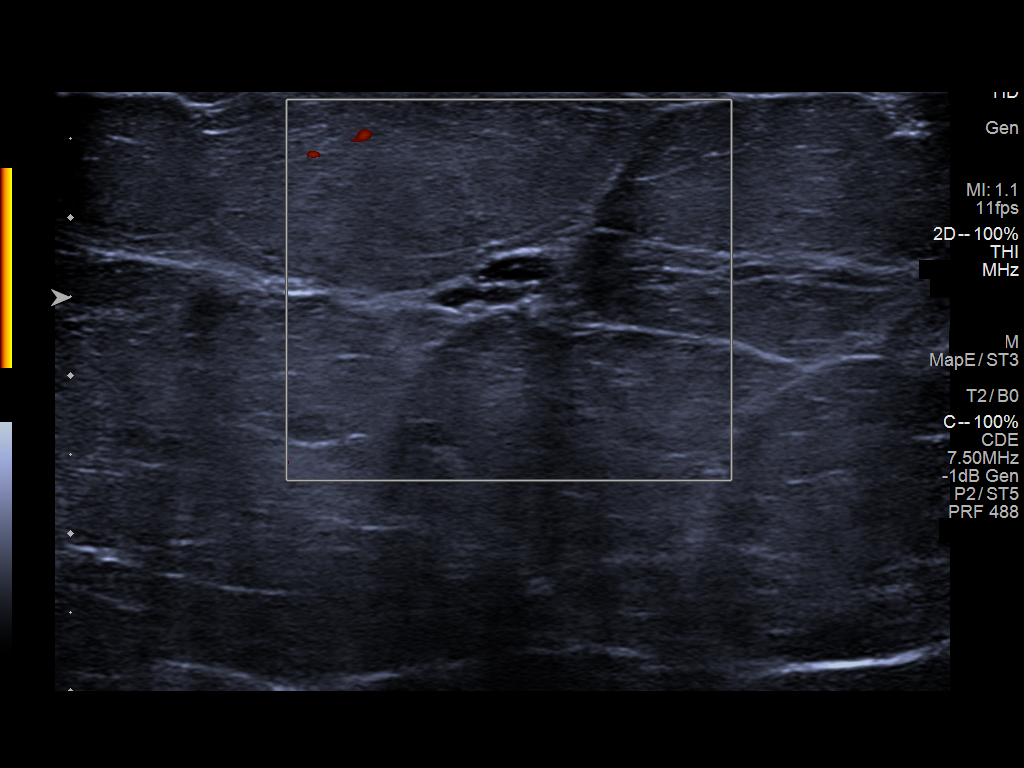
[im 4/5]
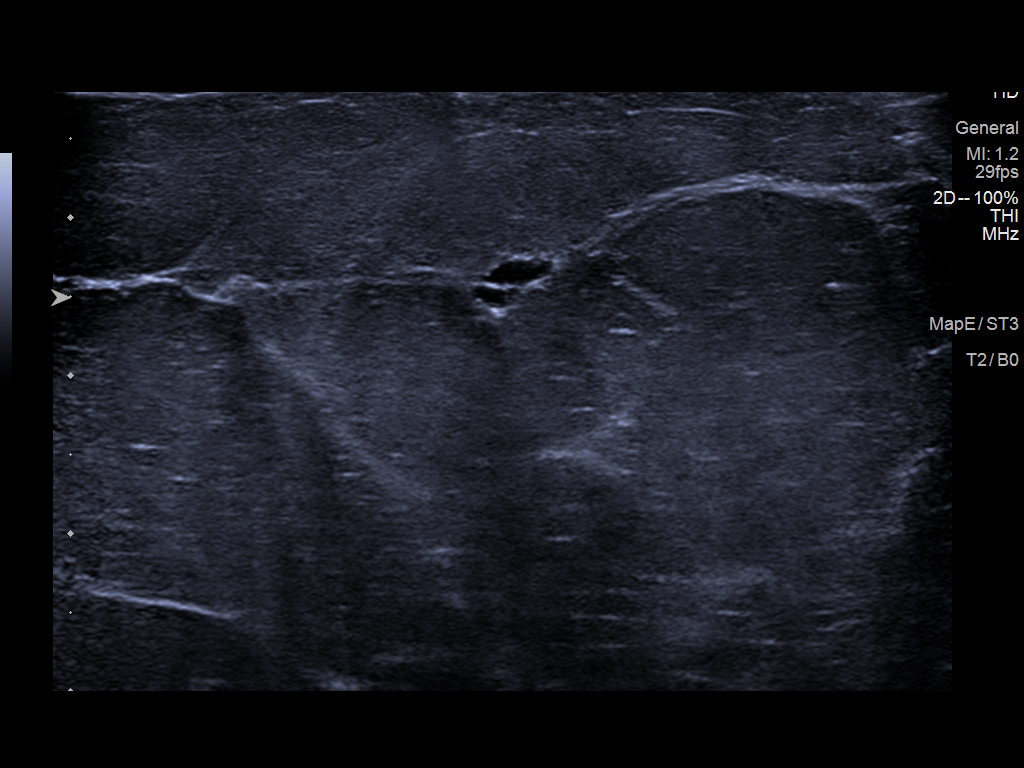
[im 5/5]
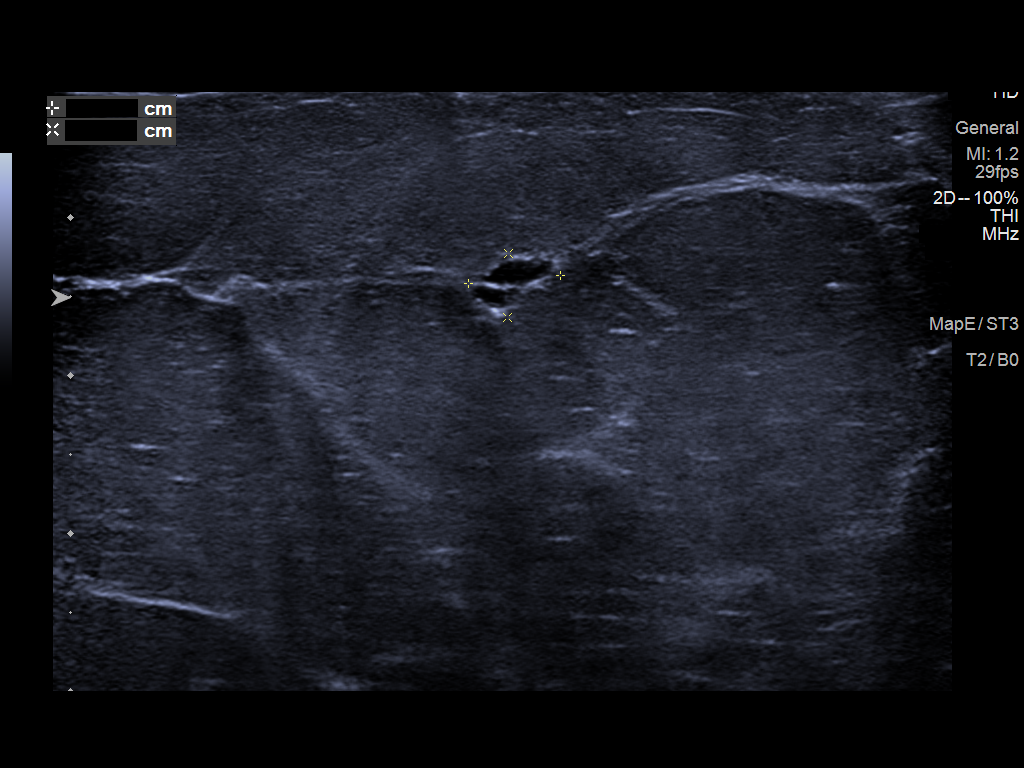

[5 of 5 positions shown; findings below may reference images not displayed]

FINDINGS: Spot compression tomosynthesis views confirm persistence of an oval
circumscribed mass in the RIGHT upper slightly inner breast at
middle depth. Spot compression tomosynthesis views confirm
persistence of an oval circumscribed mass in the LEFT lower breast
at middle depth. There is a suggestion of an internal fatty notch,
most consistent with an intramammary lymph node.

On physical exam, no suspicious mass is appreciated.

Targeted ultrasound was performed of the LEFT lower breast. At 6
o'clock 5 cm from the nipple, there is an oval circumscribed
reniform mass. It demonstrates an internal echogenic hilum and thin
smooth cortex. It measures 5 x 7 x 4 mm and is consistent with a
benign intramammary lymph node. This corresponds to the site of
screening mammographic concern.

Targeted ultrasound was performed of the RIGHT upper inner breast.
At 1 o'clock 6 cm from the nipple, there is an oval circumscribed
anechoic mass with thin internal septation and posterior acoustic
enhancement. It measures 10 x 6 x 4 mm, and is consistent with a
benign cluster of cysts. This corresponds to the site of screening
mammographic concern.
IMPRESSION: 1. There are benign cysts at the site of screening mammographic
concern in the RIGHT breast. No mammographic or sonographic evidence
of malignancy.
2. There is a benign intramammary lymph node at the site of
screening mammographic concern in the LEFT breast.

RECOMMENDATION:
Screening mammogram in one year.(Code:JF-S-M9Q)

I have discussed the findings and recommendations with the patient.
If applicable, a reminder letter will be sent to the patient
regarding the next appointment.

BI-RADS CATEGORY  2: Benign.

## 2021-09-26 IMAGING — MG DIGITAL DIAGNOSTIC BILAT W/ TOMO W/ CAD
6 of 12 series · 6 of 36 positions shown · non-contrast
Comparison: Previous exam(s).

ACR Breast Density Category a: The breast tissue is almost entirely
fatty.

CLINICAL DATA: Callback for bilateral masses from new baseline exam



[R CC synth-2D]
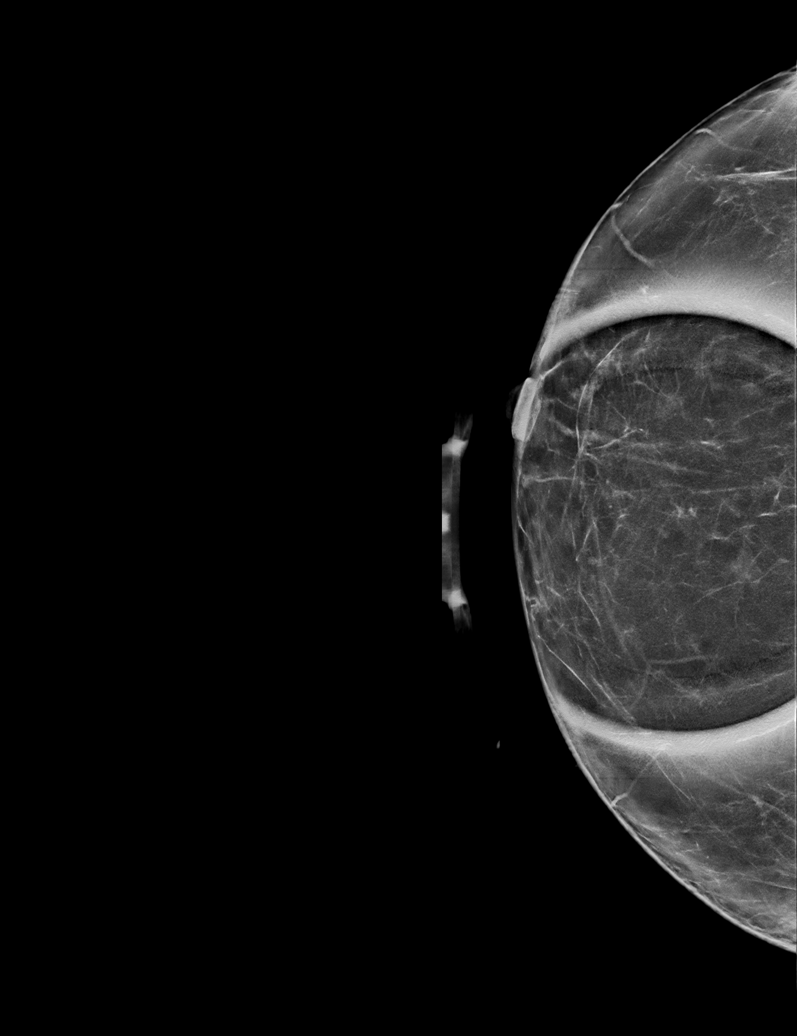

[R MLO synth-2D (1 of 2)]
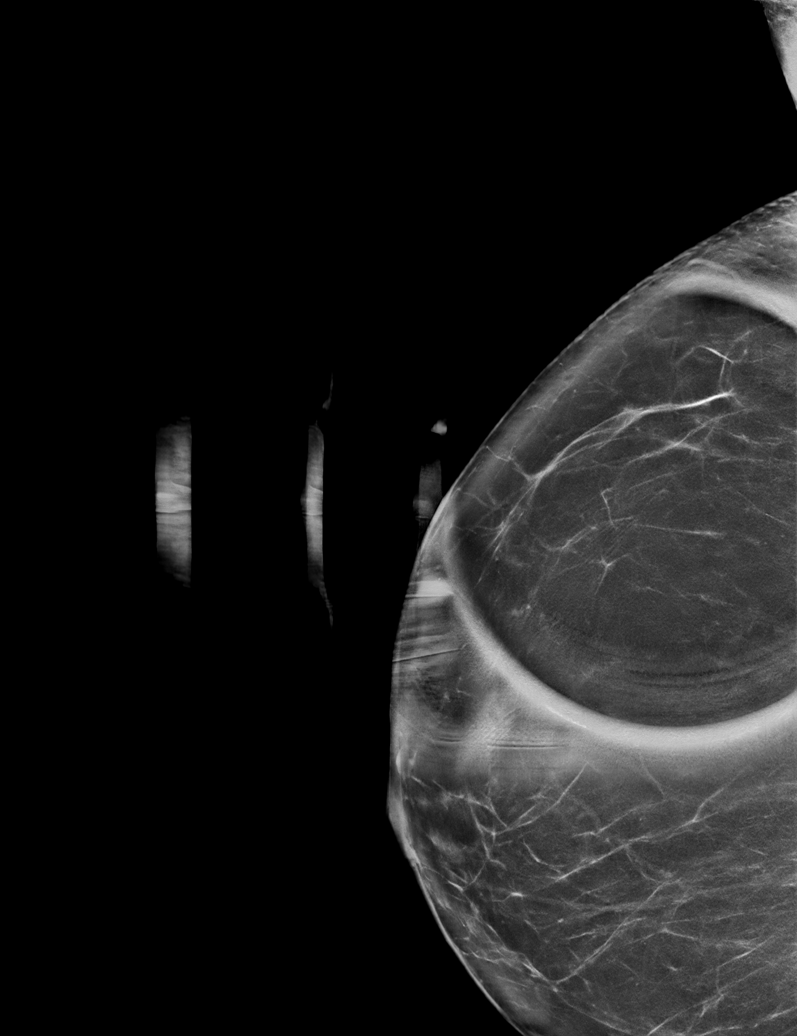

[L MLO synth-2D (1 of 2)]
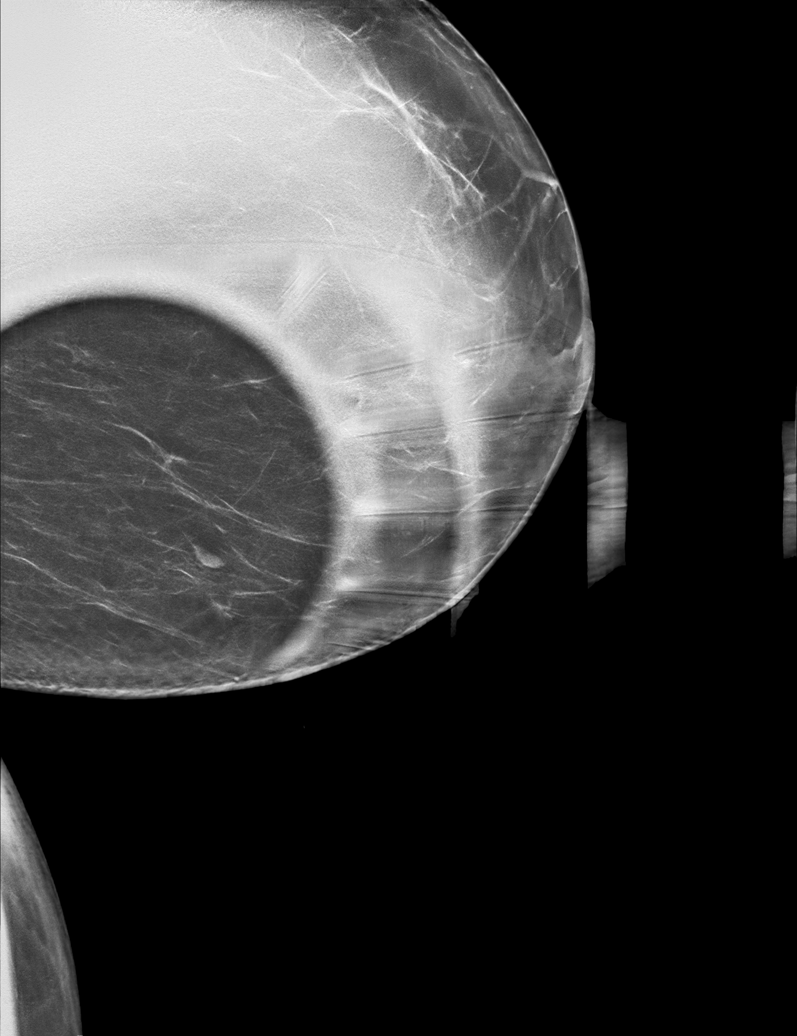

[L CC synth-2D]
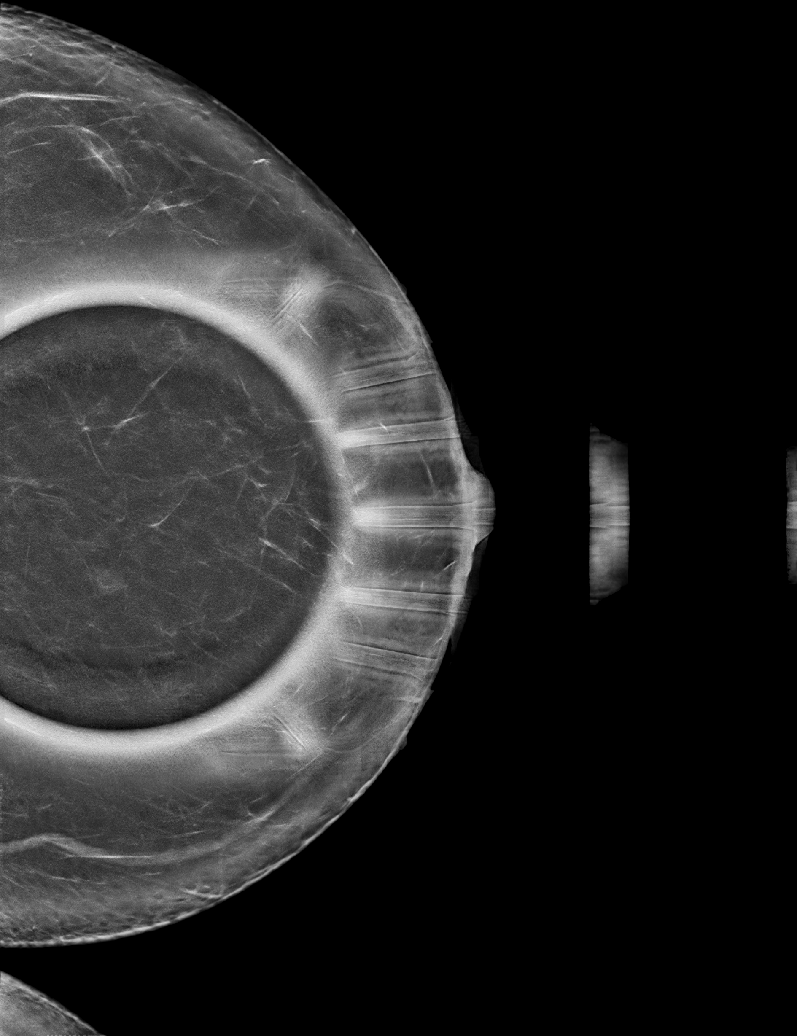

[L MLO synth-2D (2 of 2)]
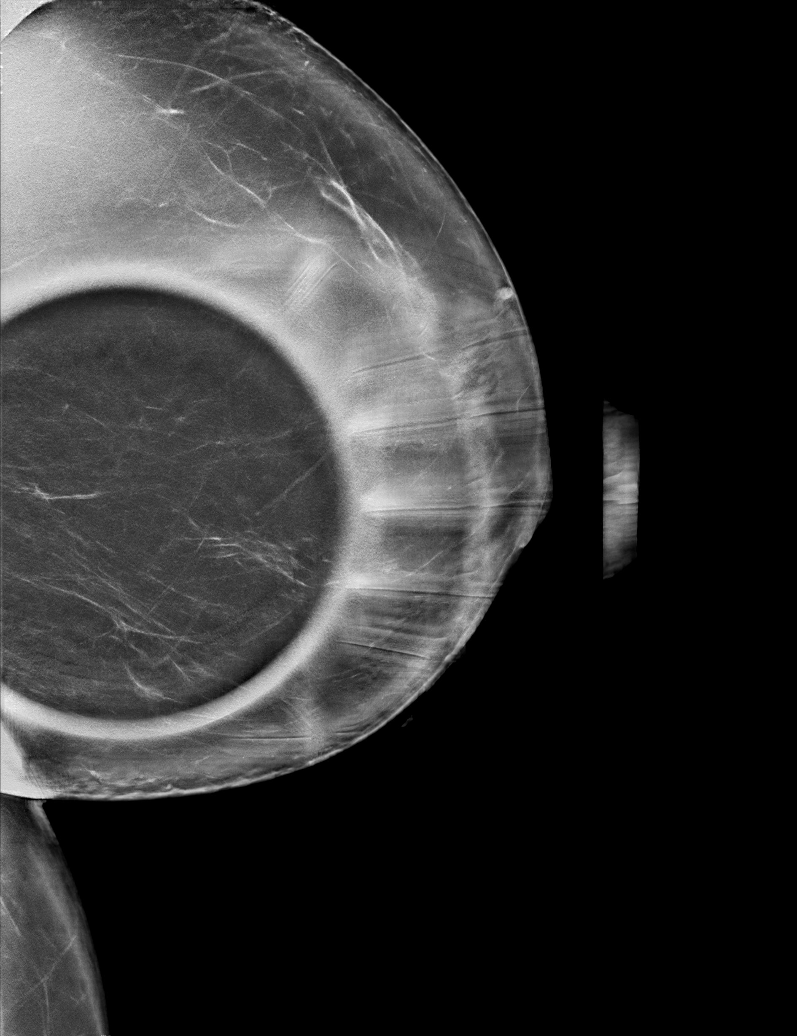

[R MLO synth-2D (2 of 2)]
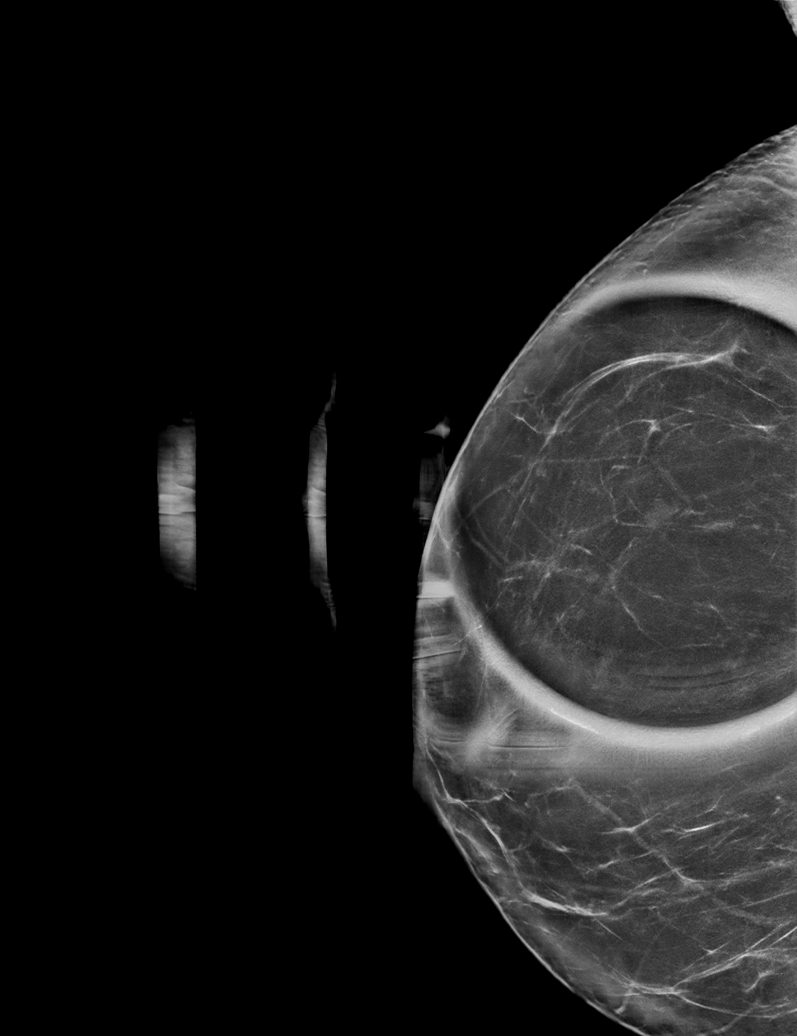

[6 of 36 positions shown; findings below may reference images not displayed]

FINDINGS: Spot compression tomosynthesis views confirm persistence of an oval
circumscribed mass in the RIGHT upper slightly inner breast at
middle depth. Spot compression tomosynthesis views confirm
persistence of an oval circumscribed mass in the LEFT lower breast
at middle depth. There is a suggestion of an internal fatty notch,
most consistent with an intramammary lymph node.

On physical exam, no suspicious mass is appreciated.

Targeted ultrasound was performed of the LEFT lower breast. At 6
o'clock 5 cm from the nipple, there is an oval circumscribed
reniform mass. It demonstrates an internal echogenic hilum and thin
smooth cortex. It measures 5 x 7 x 4 mm and is consistent with a
benign intramammary lymph node. This corresponds to the site of
screening mammographic concern.

Targeted ultrasound was performed of the RIGHT upper inner breast.
At 1 o'clock 6 cm from the nipple, there is an oval circumscribed
anechoic mass with thin internal septation and posterior acoustic
enhancement. It measures 10 x 6 x 4 mm, and is consistent with a
benign cluster of cysts. This corresponds to the site of screening
mammographic concern.
IMPRESSION: 1. There are benign cysts at the site of screening mammographic
concern in the RIGHT breast. No mammographic or sonographic evidence
of malignancy.
2. There is a benign intramammary lymph node at the site of
screening mammographic concern in the LEFT breast.

RECOMMENDATION:
Screening mammogram in one year.(Code:[id number])

I have discussed the findings and recommendations with the patient.
If applicable, a reminder letter will be sent to the patient
regarding the next appointment.

BI-RADS CATEGORY  2: Benign.

## 2021-09-29 ENCOUNTER — Ambulatory Visit (INDEPENDENT_AMBULATORY_CARE_PROVIDER_SITE_OTHER): Payer: 59

## 2021-09-29 VITALS — BP 109/72 | HR 85 | Temp 98.3°F | Wt 221.8 lb

## 2021-09-29 DIAGNOSIS — F319 Bipolar disorder, unspecified: Secondary | ICD-10-CM

## 2021-09-29 MED ORDER — ARIPIPRAZOLE ER 400 MG IM PRSY
400.0000 mg | PREFILLED_SYRINGE | Freq: Once | INTRAMUSCULAR | Status: AC
  Administered 2021-09-29: 400 mg via INTRAMUSCULAR

## 2021-10-01 ENCOUNTER — Telehealth: Payer: Self-pay

## 2021-10-01 NOTE — Telephone Encounter (Signed)
LMTCB to office for my chart message ?
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
## 2021-10-12 ENCOUNTER — Other Ambulatory Visit: Payer: Self-pay

## 2021-10-12 ENCOUNTER — Encounter: Payer: Self-pay | Admitting: Psychiatry

## 2021-10-12 ENCOUNTER — Ambulatory Visit (INDEPENDENT_AMBULATORY_CARE_PROVIDER_SITE_OTHER): Payer: 59 | Admitting: Psychiatry

## 2021-10-12 VITALS — BP 113/80 | HR 72 | Temp 97.5°F | Wt 225.0 lb

## 2021-10-12 DIAGNOSIS — F319 Bipolar disorder, unspecified: Secondary | ICD-10-CM | POA: Diagnosis not present

## 2021-10-12 DIAGNOSIS — F4310 Post-traumatic stress disorder, unspecified: Secondary | ICD-10-CM | POA: Diagnosis not present

## 2021-10-12 DIAGNOSIS — G2581 Restless legs syndrome: Secondary | ICD-10-CM | POA: Diagnosis not present

## 2021-10-12 MED ORDER — DIVALPROEX SODIUM ER 500 MG PO TB24
1000.0000 mg | ORAL_TABLET | Freq: Every day | ORAL | 1 refills | Status: DC
  Filled 2021-10-12: qty 60, 30d supply, fill #0
  Filled 2021-11-21: qty 60, 30d supply, fill #1

## 2021-10-12 NOTE — Patient Instructions (Signed)
Continue Abilify Maintena monthly injection ?Increase Depakote ER 1000 mg daily five days after starting the above dose ?Obtain labs (LFT, VPA) ?Next appointment: 6/27 at 4 PM ?

## 2021-10-23 ENCOUNTER — Other Ambulatory Visit: Payer: Self-pay

## 2021-10-25 ENCOUNTER — Other Ambulatory Visit: Payer: Self-pay

## 2021-10-26 ENCOUNTER — Other Ambulatory Visit: Payer: Self-pay

## 2021-10-27 ENCOUNTER — Other Ambulatory Visit: Payer: Self-pay | Admitting: Psychiatry

## 2021-10-27 ENCOUNTER — Ambulatory Visit (INDEPENDENT_AMBULATORY_CARE_PROVIDER_SITE_OTHER): Payer: 59

## 2021-10-27 DIAGNOSIS — F310 Bipolar disorder, current episode hypomanic: Secondary | ICD-10-CM | POA: Diagnosis not present

## 2021-10-27 DIAGNOSIS — F3113 Bipolar disorder, current episode manic without psychotic features, severe: Secondary | ICD-10-CM

## 2021-10-27 MED ORDER — ARIPIPRAZOLE ER 400 MG IM PRSY
400.0000 mg | PREFILLED_SYRINGE | INTRAMUSCULAR | Status: DC
  Administered 2021-10-27 – 2021-12-22 (×3): 400 mg via INTRAMUSCULAR

## 2021-10-27 NOTE — Patient Instructions (Signed)
  Patient in today for due Abilify Maintena 400 mg IM injection.  Patient denied any auditory or visual hallucinations, no thoughts of wanting to harm herself of others and mood level.    Patient requested to have the due injection in her right deltoid.  Injection prepared as ordered and given to patient in her requested right deltoid area. [facility] [id number] Lot# [id number] exp 11-05-2023  SN# [id number]  GTTN: # [id number].    Patient tolerated due injection without complaint of pain or discomfort.  Patient to return in 28 days for the next due Abilify Maintena 400 mg IM injection. Patient to call if any issues prior to then.

## 2021-10-27 NOTE — Telephone Encounter (Signed)
abi

## 2021-11-21 ENCOUNTER — Other Ambulatory Visit: Payer: Self-pay

## 2021-11-22 ENCOUNTER — Other Ambulatory Visit: Payer: Self-pay

## 2021-11-23 ENCOUNTER — Other Ambulatory Visit: Payer: Self-pay

## 2021-11-24 ENCOUNTER — Ambulatory Visit (INDEPENDENT_AMBULATORY_CARE_PROVIDER_SITE_OTHER): Payer: 59

## 2021-11-24 DIAGNOSIS — F3113 Bipolar disorder, current episode manic without psychotic features, severe: Secondary | ICD-10-CM | POA: Diagnosis not present

## 2021-11-24 DIAGNOSIS — F310 Bipolar disorder, current episode hypomanic: Secondary | ICD-10-CM
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
## 2021-11-24 NOTE — Patient Instructions (Signed)
Patient in today for due Abilify Maintena 400 mg IM injection.  Patient denied any auditory or visual hallucinations, no thoughts of wanting to harm herself of others and mood level.    Patient requested to have the due injection in her left deltoid.  Injection prepared as ordered and given to patient in her requested right deltoid area. [facility] 16109-604-54 Lot# [id number] exp 03-06-2024  SN# [id number] GTTN: # [id number].     Patient tolerated due injection without complaint of pain or discomfort.  Patient to return in 28 days for the next due Abilify Maintena 400 mg IM injection. Patient to call if any issues prior to then.

## 2021-11-30 ENCOUNTER — Telehealth: Payer: Self-pay | Admitting: Psychiatry

## 2021-11-30 ENCOUNTER — Ambulatory Visit: Payer: 59 | Admitting: Psychiatry

## 2021-12-11 ENCOUNTER — Other Ambulatory Visit
Admission: RE | Admit: 2021-12-11 | Discharge: 2021-12-11 | Disposition: A | Discharge status: Home / Self Care | Payer: 59 | Attending: Psychiatry | Admitting: Psychiatry

## 2021-12-11 DIAGNOSIS — F319 Bipolar disorder, unspecified: Secondary | ICD-10-CM | POA: Diagnosis not present

## 2021-12-11 LAB — VALPROIC ACID LEVEL: Valproic Acid Lvl: 72 ug/mL (ref 50.0–100.0)

## 2021-12-12 ENCOUNTER — Encounter: Payer: Self-pay | Admitting: Psychiatry
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
## 2021-12-14 ENCOUNTER — Telehealth (INDEPENDENT_AMBULATORY_CARE_PROVIDER_SITE_OTHER): Payer: 59 | Admitting: Psychiatry

## 2021-12-14 ENCOUNTER — Other Ambulatory Visit: Payer: Self-pay

## 2021-12-14 ENCOUNTER — Encounter: Payer: Self-pay | Admitting: Psychiatry

## 2021-12-14 DIAGNOSIS — F319 Bipolar disorder, unspecified: Secondary | ICD-10-CM | POA: Diagnosis not present

## 2021-12-14 DIAGNOSIS — G2581 Restless legs syndrome: Secondary | ICD-10-CM | POA: Diagnosis not present

## 2021-12-14 MED ORDER — DIVALPROEX SODIUM ER 500 MG PO TB24
1000.0000 mg | ORAL_TABLET | Freq: Every day | ORAL | 1 refills | Status: DC
Start: 2021-12-24 — End: 2022-01-04
  Filled 2021-12-14: qty 60, 30d supply, fill #0

## 2021-12-14 NOTE — Patient Instructions (Signed)
Continue Abilify Maintena monthly injection Continue Depakote ER 1000 mg daily  Obtain labs (ferritin, TIBC panels) Next appointment: 9/5 at 3:30

## 2021-12-15 ENCOUNTER — Other Ambulatory Visit: Payer: Self-pay

## 2021-12-17 ENCOUNTER — Other Ambulatory Visit: Payer: Self-pay

## 2021-12-20 ENCOUNTER — Other Ambulatory Visit: Payer: Self-pay

## 2021-12-22 ENCOUNTER — Ambulatory Visit (INDEPENDENT_AMBULATORY_CARE_PROVIDER_SITE_OTHER): Payer: 59

## 2021-12-22 DIAGNOSIS — F3113 Bipolar disorder, current episode manic without psychotic features, severe: Secondary | ICD-10-CM

## 2021-12-22 DIAGNOSIS — F310 Bipolar disorder, current episode hypomanic: Secondary | ICD-10-CM

## 2021-12-22 NOTE — Patient Instructions (Signed)
Patient in today for due Abilify Maintena 400 mg IM injection.  Patient denied any auditory or visual hallucinations, no thoughts of wanting to harm herself of others and mood level.    Patient requested to have the due injection in her right deltoid.  Injection prepared as ordered and given to patient in her requested right deltoid area. [facility] [id number] Lot# [id number] exp 05-06-2024  SN# [id number] GTTN: # [id number].     Patient tolerated due injection without complaint of pain or discomfort.  Patient to return in 28 days for the next due Abilify Maintena 400 mg IM injection. Patient to call if any issues prior to then.

## 2022-01-04 ENCOUNTER — Telehealth (INDEPENDENT_AMBULATORY_CARE_PROVIDER_SITE_OTHER): Payer: 59 | Admitting: Psychiatry

## 2022-01-04 ENCOUNTER — Other Ambulatory Visit: Payer: Self-pay

## 2022-01-04 ENCOUNTER — Encounter: Payer: Self-pay | Admitting: Psychiatry

## 2022-01-04 VITALS — BP 132/82 | HR 84 | Temp 98.1°F | Wt 226.0 lb

## 2022-01-04 DIAGNOSIS — F319 Bipolar disorder, unspecified: Secondary | ICD-10-CM | POA: Diagnosis not present

## 2022-01-04 DIAGNOSIS — G2581 Restless legs syndrome: Secondary | ICD-10-CM

## 2022-01-04 MED ORDER — DIVALPROEX SODIUM ER 500 MG PO TB24
1000.0000 mg | ORAL_TABLET | Freq: Every day | ORAL | 0 refills | Status: DC
  Filled 2022-01-04 – 2022-02-10 (×2): qty 180, 90d supply, fill #0

## 2022-01-04 MED ORDER — ARIPIPRAZOLE 20 MG PO TABS
20.0000 mg | ORAL_TABLET | Freq: Every day | ORAL | 0 refills | Status: DC
  Filled 2022-01-04: qty 90, 90d supply, fill #0

## 2022-01-04 MED ORDER — ARIPIPRAZOLE 10 MG PO TABS
10.0000 mg | ORAL_TABLET | Freq: Every day | ORAL | 0 refills | Status: DC
  Filled 2022-01-04: qty 7, 7d supply, fill #0
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
## 2022-01-05 ENCOUNTER — Ambulatory Visit: Payer: 59 | Admitting: Family

## 2022-01-05 ENCOUNTER — Other Ambulatory Visit: Payer: Self-pay

## 2022-01-10 ENCOUNTER — Other Ambulatory Visit: Payer: Self-pay

## 2022-01-10 ENCOUNTER — Ambulatory Visit: Payer: 59 | Admitting: Psychiatry

## 2022-01-26 NOTE — Telephone Encounter (Signed)
My Chart message sent

## 2022-01-27 ENCOUNTER — Other Ambulatory Visit: Payer: Self-pay

## 2022-02-08 ENCOUNTER — Ambulatory Visit: Payer: 59 | Admitting: Psychiatry

## 2022-02-09 ENCOUNTER — Other Ambulatory Visit (HOSPITAL_COMMUNITY): Payer: Self-pay

## 2022-02-10 ENCOUNTER — Other Ambulatory Visit: Payer: Self-pay

## 2022-02-15 ENCOUNTER — Other Ambulatory Visit: Payer: Self-pay
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
## 2022-03-11 ENCOUNTER — Other Ambulatory Visit: Payer: Self-pay

## 2022-03-11 ENCOUNTER — Encounter: Payer: Self-pay | Admitting: Psychiatry

## 2022-03-11 ENCOUNTER — Telehealth (INDEPENDENT_AMBULATORY_CARE_PROVIDER_SITE_OTHER): Payer: Self-pay | Admitting: Psychiatry

## 2022-03-11 DIAGNOSIS — F4310 Post-traumatic stress disorder, unspecified: Secondary | ICD-10-CM

## 2022-03-11 DIAGNOSIS — F319 Bipolar disorder, unspecified: Secondary | ICD-10-CM

## 2022-03-11 DIAGNOSIS — G2581 Restless legs syndrome: Secondary | ICD-10-CM

## 2022-03-11 MED ORDER — ARIPIPRAZOLE ER 400 MG IM PRSY: 400.0000 mg | PREFILLED_SYRINGE | INTRAMUSCULAR | Status: AC

## 2022-03-11 MED ORDER — ARIPIPRAZOLE ER 400 MG IM PRSY
400.0000 mg | PREFILLED_SYRINGE | INTRAMUSCULAR | 5 refills | Status: DC
  Filled 2022-03-11 (×2): qty 1, 28d supply, fill #0

## 2022-03-15 ENCOUNTER — Other Ambulatory Visit: Payer: Self-pay | Admitting: Psychiatry

## 2022-03-15 ENCOUNTER — Telehealth: Payer: Self-pay | Admitting: Psychiatry

## 2022-03-15 NOTE — Telephone Encounter (Signed)
Patient left message to cancel her injection appointment for tomorrow. Wants to know if you can call in pill prescription instead of injection. Her insurance wants to charge $1200 for the injection and she cannot afford that. Please advise.

## 2022-03-15 NOTE — Telephone Encounter (Signed)
i spoke with patient and she states that she has some abilify '10mg'$  she can take but she wanted to get you to send in the ability '20mg'$  in to see how much it going to cost because if it cost too much she going to have to be try something else.

## 2022-03-15 NOTE — Telephone Encounter (Signed)
Which pharmacy does she use? Also, I see in Epic that she filled Abilify 20 mg (Last Dispense: 01/06/2022) for 90 days- I believe she should have at least for the next few weeks. Please verify this with the patient.

## 2022-03-15 NOTE — Telephone Encounter (Signed)
Sure. Please advise her to take Abilify 20 mg daily. I believe she has plenty of Abilify 20 mg tab (ordered 90 days previously, and she has not taken these consistently). Let me know if she does not have it for any reason.

## 2022-03-16 ENCOUNTER — Ambulatory Visit: Payer: Self-pay

## 2022-03-16 ENCOUNTER — Other Ambulatory Visit: Payer: Self-pay | Admitting: Psychiatry

## 2022-03-16 MED ORDER — ARIPIPRAZOLE 20 MG PO TABS: 20.0000 mg | ORAL_TABLET | Freq: Every day | ORAL | 0 refills | Status: DC

## 2022-03-16 NOTE — Telephone Encounter (Signed)
One in the system

## 2022-03-16 NOTE — Telephone Encounter (Signed)
Ordered

## 2022-03-18 ENCOUNTER — Other Ambulatory Visit: Payer: Self-pay

## 2022-03-18 NOTE — Telephone Encounter (Signed)
Pt.notified

## 2022-03-31 ENCOUNTER — Encounter: Payer: Self-pay | Admitting: Emergency Medicine

## 2022-03-31 ENCOUNTER — Emergency Department
Admission: EM | Admit: 2022-03-31 | Discharge: 2022-04-01 | Disposition: A | Discharge status: Home / Self Care | Payer: Managed Care, Other (non HMO) | Attending: Emergency Medicine | Admitting: Emergency Medicine

## 2022-03-31 DIAGNOSIS — R1011 Right upper quadrant pain: Secondary | ICD-10-CM | POA: Diagnosis present

## 2022-03-31 DIAGNOSIS — K8020 Calculus of gallbladder without cholecystitis without obstruction: Secondary | ICD-10-CM | POA: Insufficient documentation

## 2022-03-31 LAB — CBC
HCT: 43.2 % (ref 36.0–46.0)
Hemoglobin: 14.1 g/dL (ref 12.0–15.0)
MCH: 28.8 pg (ref 26.0–34.0)
MCHC: 32.6 g/dL (ref 30.0–36.0)
MCV: 88.3 fL (ref 80.0–100.0)
Platelets: 363 10*3/uL (ref 150–400)
RBC: 4.89 MIL/uL (ref 3.87–5.11)
RDW: 14.2 % (ref 11.5–15.5)
WBC: 10.5 10*3/uL (ref 4.0–10.5)
nRBC: 0 % (ref 0.0–0.2)

## 2022-03-31 LAB — COMPREHENSIVE METABOLIC PANEL
ALT: 28 U/L (ref 0–44)
AST: 26 U/L (ref 15–41)
Albumin: 4.2 g/dL (ref 3.5–5.0)
Alkaline Phosphatase: 86 U/L (ref 38–126)
Anion gap: 10 (ref 5–15)
BUN: 15 mg/dL (ref 6–20)
CO2: 23 mmol/L (ref 22–32)
Calcium: 9.4 mg/dL (ref 8.9–10.3)
Chloride: 108 mmol/L (ref 98–111)
Creatinine, Ser: 0.77 mg/dL (ref 0.44–1.00)
GFR, Estimated: >60 mL/min (ref >60)
Glucose, Bld: 141 mg/dL — ABNORMAL HIGH (ref 70–99)
Potassium: 3.7 mmol/L (ref 3.5–5.1)
Sodium: 141 mmol/L (ref 135–145)
Total Bilirubin: 0.5 mg/dL (ref 0.3–1.2)
Total Protein: 7.9 g/dL (ref 6.5–8.1)

## 2022-03-31 LAB — URINALYSIS, ROUTINE W REFLEX MICROSCOPIC
Bilirubin Urine: NEGATIVE
Glucose, UA: NEGATIVE mg/dL
Hgb urine dipstick: NEGATIVE
Ketones, ur: NEGATIVE mg/dL
Leukocytes,Ua: NEGATIVE
Nitrite: NEGATIVE
Protein, ur: NEGATIVE mg/dL
Specific Gravity, Urine: 1.021 (ref 1.005–1.030)
pH: 6 (ref 5.0–8.0)

## 2022-03-31 LAB — LIPASE, BLOOD: Lipase: 75 U/L — ABNORMAL HIGH (ref 11–51)

## 2022-03-31 MED ORDER — MORPHINE SULFATE (PF) 4 MG/ML IV SOLN
4.0000 mg | Freq: Once | INTRAVENOUS | Status: DC
  Filled 2022-03-31: qty 1

## 2022-03-31 MED ORDER — ONDANSETRON HCL 4 MG/2ML IJ SOLN
4.0000 mg | Freq: Once | INTRAMUSCULAR | Status: DC
  Filled 2022-03-31: qty 2

## 2022-03-31 MED ORDER — SODIUM CHLORIDE 0.9 % IV BOLUS (SEPSIS): 1000.0000 mL | Freq: Once | INTRAVENOUS | Status: DC

## 2022-03-31 NOTE — ED Provider Notes (Signed)
[facility] Provider Note    Event Date/Time   First MD Initiated Contact with Patient 03/31/22 2336     (approximate)   History   Abdominal Pain   HPI  [name] is a 51 y.o. female with history of schizoaffective disorder who presents to the emergency department complaints of right upper quadrant abdominal pain and vomiting that started tonight after eating while [place] food for dinner.  No fevers, diarrhea, urinary symptoms.  Has had previous appendectomy, C-section and has had unilateral salpingectomy/oophorectomy after an ectopic but she cannot remember what side.  No known history of gallstones, kidney stones.   History provided by patient.    Past Medical History:  Diagnosis Date   Akathisia 12/17/2014   Bipolar affective ([place])    Restless leg syndrome    Schizoaffective disorder (HCC)    Schizoaffective disorder (HCC)     Past Surgical History:  Procedure Laterality Date   APPENDECTOMY     CESAREAN SECTION     COLONOSCOPY WITH PROPOFOL N/A 01/28/2021   Procedure: COLONOSCOPY WITH PROPOFOL;  Surgeon: [name], MD;  Location: [facility] ENDOSCOPY;  Service: Endoscopy;  Laterality: N/A;   LAPAROSCOPIC APPENDECTOMY N/A 07/02/2020   Procedure: APPENDECTOMY LAPAROSCOPIC;  Surgeon: Fredirick Maudlin, MD;  Location: [facility] ORS;  Service: General;  Laterality: N/A;    MEDICATIONS:  Prior to Admission medications   Medication Sig Start Date End Date Taking? Authorizing Provider  ARIPiprazole (ABILIFY) 10 MG tablet Take 1 tablet (10 mg total) by mouth daily for 7 days. 01/11/22 01/18/22  Norman Clay, MD  ARIPiprazole (ABILIFY) 20 MG tablet Take 1 tablet (20 mg total) by mouth daily. 03/16/22 06/14/22  Norman Clay, MD  ARIPiprazole ER (ABILIFY MAINTENA) 400 MG PRSY prefilled syringe Inject 400 mg into the muscle every 28 (twenty-eight) days. 03/11/22 09/07/22  Norman Clay, MD  divalproex (DEPAKOTE ER) 500 MG 24 hr tablet Take 2 tablets (1,000  mg total) by mouth daily. 01/24/22 04/24/22  Norman Clay, MD  estradiol (ESTRACE) 0.1 MG/GM vaginal cream Place 0.5 g vaginally twice a week 04/13/21     estradiol (ESTRACE) 0.1 MG/GM vaginal cream Place vaginally. 04/13/21   [provider]  ferrous sulfate 325 (65 FE) MG tablet Take 325 mg by mouth every other day.    [provider]  pramipexole (MIRAPEX) 0.125 MG tablet Take 1 tablet (0.125 mg total) by mouth every evening. 07/12/21   Burnard Hawthorne, FNP    Physical Exam   Triage Vital Signs: ED Triage Vitals  Enc Vitals Group     BP 03/31/22 2228 (!) 157/90     Pulse Rate 03/31/22 2228 88     Resp 03/31/22 2228 18     Temp 03/31/22 2228 98.3 F (36.8 C)     Temp Source 03/31/22 2228 Oral     SpO2 03/31/22 2228 98 %     Weight 03/31/22 2227 220 lb (99.8 kg)     Height 03/31/22 2227 '5\' 2"'$  (1.575 m)     Head Circumference --      Peak Flow --      Pain Score --      Pain Loc --      Pain Edu? --      Excl. in [place]? --     Most recent vital signs: Vitals:   03/31/22 2228 03/31/22 2340  BP: (!) 157/90 (!) 157/83  Pulse: 88 90  Resp: 18 (!) 21  Temp: 98.3 F (36.8  C) 98.3 F (36.8 C)  SpO2: 98% 97%    CONSTITUTIONAL: Alert and oriented and responds appropriately to questions. Well-appearing; well-nourished HEAD: Normocephalic, atraumatic EYES: Conjunctivae clear, pupils appear equal, sclera nonicteric ENT: normal nose; moist mucous membranes NECK: Supple, normal ROM CARD: RRR; S1 and S2 appreciated; no murmurs, no clicks, no rubs, no gallops RESP: Normal chest excursion without splinting or tachypnea; breath sounds clear and equal bilaterally; no wheezes, no rhonchi, no rales, no hypoxia or respiratory distress, speaking full sentences ABD/GI: Normal bowel sounds; non-distended; soft, tender to palpation in the right upper quadrant without guarding or rebound, negative Murphy sign BACK: The back appears normal EXT: Normal ROM in all joints; no  deformity noted, no edema; no cyanosis SKIN: Normal color for age and race; warm; no rash on exposed skin NEURO: Moves all extremities equally, normal speech PSYCH: The patient's mood and manner are appropriate.   ED Results / Procedures / Treatments   LABS: (all labs ordered are listed, but only abnormal results are displayed) Labs Reviewed  LIPASE, BLOOD - Abnormal; Notable for the following components:      Result Value   Lipase 75 (*)    All other components within normal limits  COMPREHENSIVE METABOLIC PANEL - Abnormal; Notable for the following components:   Glucose, Bld 141 (*)    All other components within normal limits  URINALYSIS, ROUTINE W REFLEX MICROSCOPIC - Abnormal; Notable for the following components:   Color, Urine YELLOW (*)    APPearance HAZY (*)    All other components within normal limits  CBC     EKG:   RADIOLOGY: My personal review and interpretation of imaging: Cholelithiasis without cholecystitis.  I have personally reviewed all radiology reports.   US ABDOMEN LIMITED RUQ (LIVER/GB)  Result Date: 04/01/2022 CLINICAL DATA:  Right upper quadrant pain EXAM: ULTRASOUND ABDOMEN LIMITED RIGHT UPPER QUADRANT COMPARISON:  07/17/2020 FINDINGS: Gallbladder: Cholelithiasis is noted. Gallbladder appears partially distended with very mild wall thickening identified. Negative sonographic Murphy's sign is elicited however. Common bile duct: Diameter: 4 mm. Liver: Increased in echogenicity consistent with fatty infiltration. No focal mass is noted. Portal vein is patent on color Doppler imaging with normal direction of blood flow towards the liver. Other: None. IMPRESSION: Cholelithiasis and mild wall thickening although a negative sonographic Murphy's sign is elicited. Fatty liver. Electronically Signed   By: Inez Catalina M.D.   On: 04/01/2022 00:59     PROCEDURES:  Critical Care performed: No    .1-3 Lead EKG Interpretation  Performed by: Deontez Klinke, Delice Bison,  DO Authorized by: Zadok Holaway, Delice Bison, DO     Interpretation: normal     ECG rate:  90   ECG rate assessment: normal     Rhythm: sinus rhythm     Ectopy: none     Conduction: normal       IMPRESSION / MDM / ASSESSMENT AND PLAN / ED COURSE  I reviewed the triage vital signs and the nursing notes.    Patient here with right upper quad abdominal pain, vomiting.  The patient is on the cardiac monitor to evaluate for evidence of arrhythmia and/or significant heart rate changes.   DIFFERENTIAL DIAGNOSIS (includes but not limited to):   Cholelithiasis, cholecystitis, cholangitis, choledocholithiasis, pancreatitis, gastritis, GERD   Patient's presentation is most consistent with acute presentation with potential threat to life or bodily function.   PLAN: Labs obtained in triage.  No leukocytosis, normal LFTs.  Lipase minimally elevated at 75.  Will obtain  right upper quadrant ultrasound.  Will give IV fluids, pain and nausea medicine.   MEDICATIONS GIVEN IN ED: Medications - No data to display    ED COURSE: Patient's ultrasound reviewed and interpreted by myself and the radiologist and shows cholelithiasis without choledocholithiasis, cholecystitis.  Patient's pain and nausea resolved without any IV medications here.  She is tolerating p.o.  I feel she is safe for discharge.  Will discharge with pain and nausea medicine, instructions for low-fat diet and general surgery follow-up information.   At this time, I do not feel there is any life-threatening condition present. I reviewed all nursing notes, vitals, pertinent previous records.  All lab and urine results, EKGs, imaging ordered have been independently reviewed and interpreted by myself.  I reviewed all available radiology reports from any imaging ordered this visit.  Based on my assessment, I feel the patient is safe to be discharged home without further emergent workup and can continue workup as an outpatient as needed. Discussed  all findings, treatment plan as well as usual and customary return precautions.  They verbalize understanding and are comfortable with this plan.  Outpatient follow-up has been provided as needed.  All questions have been answered.    CONSULTS: No general surgery consult needed at this time.  Patient is appropriate for outpatient management.   OUTSIDE RECORDS REVIEWED: Reviewed patient's last OB/GYN note on 04/13/2021.       FINAL CLINICAL IMPRESSION(S) / ED DIAGNOSES   Final diagnoses:  RUQ abdominal pain  Gallstones     Rx / DC Orders   ED Discharge Orders          Ordered    HYDROcodone-acetaminophen (NORCO/VICODIN) 5-325 MG tablet  Every 6 hours PRN        04/01/22 0121    ondansetron (ZOFRAN-ODT) 4 MG disintegrating tablet  Every 6 hours PRN        04/01/22 0121             Note:  This document was prepared using Dragon voice recognition software and may include unintentional dictation errors.   Tyffany Waldrop, Delice Bison, DO 04/01/22 [phone]

## 2022-03-31 NOTE — ED Triage Notes (Signed)
Pt presents via POV with complaints of RLQ pain that started 2 hours ago with associated N/V. Hx of appendectomy 2 years ago. She notes taking tylenol but vomited shortly after. Denies CP or SOB.

## 2022-04-01 ENCOUNTER — Emergency Department: Payer: Managed Care, Other (non HMO)

## 2022-04-01 MED ORDER — HYDROCODONE-ACETAMINOPHEN 5-325 MG PO TABS: 1.0000 | ORAL_TABLET | Freq: Four times a day (QID) | ORAL | 0 refills | Status: DC | PRN

## 2022-04-01 MED ORDER — ONDANSETRON 4 MG PO TBDP: 4.0000 mg | ORAL_TABLET | Freq: Four times a day (QID) | ORAL | 0 refills | Status: DC | PRN

## 2022-04-01 NOTE — ED Notes (Signed)
Patient given 4 oz of water for PO challenge. Patient was able to tolerate fluids. No complaints of nausea.

## 2022-04-01 NOTE — Discharge Instructions (Addendum)

## 2022-04-04 ENCOUNTER — Encounter: Payer: Self-pay | Admitting: Surgery

## 2022-04-04 ENCOUNTER — Telehealth: Payer: Self-pay | Admitting: Surgery

## 2022-04-04 ENCOUNTER — Ambulatory Visit: Payer: Managed Care, Other (non HMO) | Admitting: Surgery

## 2022-04-04 VITALS — BP 121/78 | HR 77 | Temp 97.9°F | Ht 62.0 in | Wt 223.6 lb

## 2022-04-04 DIAGNOSIS — K8020 Calculus of gallbladder without cholecystitis without obstruction: Secondary | ICD-10-CM

## 2022-04-04 NOTE — Patient Instructions (Signed)
Our surgery scheduler Pamala Hurry will call you within 24-48 hours to get you scheduled. If you have not heard from her after 48 hours, please call our office. Have the blue sheet available when she calls to write down important information.  If you have any concerns or questions, please feel free to call our office.   Gallbladder Eating Plan High blood cholesterol, obesity, a sedentary lifestyle, an unhealthy diet, and diabetes are risk factors for developing gallstones. If you have a gallbladder condition, you may have trouble digesting fats and tolerating high fat intake. Eating a low-fat diet can help reduce your symptoms and may be helpful before and after having surgery to remove your gallbladder (cholecystectomy). Your health care provider may recommend that you work with a dietitian to help you reduce the amount of fat in your diet. What are tips for following this plan? General guidelines Limit your fat intake to less than 30% of your total daily calories. If you eat around 1,800 calories each day, this means eating less than 60 grams (g) of fat per day. Fat is an important part of a healthy diet. Eating a low-fat diet can make it hard to maintain a healthy body weight. Ask your dietitian how much fat, calories, and other nutrients you need each day. Eat small, frequent meals throughout the day instead of three large meals. Drink at least 8-10 cups (1.9-2.4 L) of fluid a day. Drink enough fluid to keep your urine pale yellow. If you drink alcohol: Limit how much you have to: 0-1 drink a day for women who are not pregnant. 0-2 drinks a day for men. Know how much alcohol is in a drink. In the [place] one drink equals one 12 oz bottle of beer (355 mL), one 5 oz glass of wine (148 mL), or one 1 oz glass of hard liquor (44 mL). Reading food labels  Check nutrition facts on food labels for the amount of fat per serving. Choose foods with less than 3 grams of fat per serving. Shopping Choose  nonfat and low-fat healthy foods. Look for the words "nonfat," "low-fat," or "fat-free." Avoid buying processed or prepackaged foods. Cooking Cook using low-fat methods, such as baking, broiling, grilling, or boiling. Cook with small amounts of healthy fats, such as olive oil, grapeseed oil, canola oil, avocado oil, or sunflower oil. What foods are recommended?  All fresh, frozen, or canned fruits and vegetables. Whole grains. Low-fat or nonfat (skim) milk and yogurt. Lean meat, skinless poultry, fish, eggs, and beans. Low-fat protein supplement powders or drinks. Spices and herbs. The items listed above may not be a complete list of foods and beverages you can eat and drink. Contact a dietitian for more information. What foods are not recommended? High-fat foods. These include baked goods, fast food, fatty cuts of meat, ice cream, french toast, sweet rolls, pizza, cheese bread, foods covered with butter, creamy sauces, or cheese. Fried foods. These include french fries, tempura, battered fish, breaded chicken, fried breads, and sweets. Foods that cause bloating and gas. The items listed above may not be a complete list of foods that you should avoid. Contact a dietitian for more information. Summary A low-fat diet can be helpful if you have a gallbladder condition, or before and after gallbladder surgery. Limit your fat intake to less than 30% of your total daily calories. This is about 60 g of fat if you eat 1,800 calories each day. Eat small, frequent meals throughout the day instead of three large meals.  This information is not intended to replace advice given to you by your health care provider. Make sure you discuss any questions you have with your health care provider.  Minimally Invasive Cholecystectomy  Minimally invasive cholecystectomy is surgery to remove the gallbladder. The gallbladder is a pear-shaped organ that lies beneath the liver on the right side of the body. The  gallbladder stores bile, which is a fluid that helps the body digest fats. Cholecystectomy is often done to treat inflammation (irritation and swelling) of the gallbladder (cholecystitis). This condition is usually caused by a buildup of gallstones (cholelithiasis) in the gallbladder or when the fluid in the gall bladder becomes stagnant because gallstones get stuck in the ducts (tubes) and block the flow of bile. This can result in inflammation and pain. In severe cases, emergency surgery may be required. This procedure is done through small incisions in the abdomen, instead of one large incision. It is also called laparoscopic surgery. A thin scope with a camera (laparoscope) is inserted through one incision. Then surgical instruments are inserted through the other incisions. In some cases, a minimally invasive surgery may need to be changed to a surgery that is done through a larger incision. This is called open surgery. Tell a health care provider about: Any allergies you have. All medicines you are taking, including vitamins, herbs, eye drops, creams, and over-the-counter medicines. Any problems you or family members have had with anesthetic medicines. Any bleeding problems you have. Any surgeries you have had. Any medical conditions you have. Whether you are pregnant or may be pregnant. What are the risks? Generally, this is a safe procedure. However, problems may occur, including: Infection. Bleeding. Allergic reactions to medicines. Damage to nearby structures or organs. A gallstone remaining in the common bile duct. The common bile duct carries bile from the gallbladder to the small intestine. A bile leak from the liver or cystic duct after your gallbladder is removed. What happens before the procedure? When to stop eating and drinking Follow instructions from your health care provider about what you may eat and drink before your procedure. These may include: 8 hours before the  procedure Stop eating most foods. Do not eat meat, fried foods, or fatty foods. Eat only light foods, such as toast or crackers. All liquids are okay except energy drinks and alcohol. 6 hours before the procedure Stop eating. Drink only clear liquids, such as water, clear fruit juice, black coffee, plain tea, and sports drinks. Do not drink energy drinks or alcohol. 2 hours before the procedure Stop drinking all liquids. You may be allowed to take medicines with small sips of water. If you do not follow your health care provider's instructions, your procedure may be delayed or canceled. Medicines Ask your health care provider about: Changing or stopping your regular medicines. This is especially important if you are taking diabetes medicines or blood thinners. Taking medicines such as aspirin and ibuprofen. These medicines can thin your blood. Do not take these medicines unless your health care provider tells you to take them. Taking over-the-counter medicines, vitamins, herbs, and supplements. General instructions If you will be going home right after the procedure, plan to have a responsible adult: Take you home from the hospital or clinic. You will not be allowed to drive. Care for you for the time you are told. Do not use any products that contain nicotine or tobacco for at least 4 weeks before the procedure. These products include cigarettes, chewing tobacco, and vaping devices, such  as e-cigarettes. If you need help quitting, ask your health care provider. Ask your health care provider: How your surgery site will be marked. What steps will be taken to help prevent infection. These may include: Removing hair at the surgery site. Washing skin with a germ-killing soap. Taking antibiotic medicine. What happens during the procedure?  An IV will be inserted into one of your veins. You will be given one or both of the following: A medicine to help you relax (sedative). A medicine to  make you fall asleep (general anesthetic). Your surgeon will make several small incisions in your abdomen. The laparoscope will be inserted through one of the small incisions. The camera on the laparoscope will send images to a monitor in the operating room. This lets your surgeon see inside your abdomen. A gas will be pumped into your abdomen. This will expand your abdomen to give the surgeon more room to perform the surgery. Other tools that are needed for the procedure will be inserted through the other incisions. The gallbladder will be removed through one of the incisions. Your common bile duct may be examined. If stones are found in the common bile duct, they may be removed. After your gallbladder has been removed, the incisions will be closed with stitches (sutures), staples, or skin glue. Your incisions will be covered with a bandage (dressing). The procedure may vary among health care providers and hospitals. What happens after the procedure? Your blood pressure, heart rate, breathing rate, and blood oxygen level will be monitored until you leave the hospital or clinic. You will be given medicines as needed to control your pain. You may have a drain placed in the incision. The drain will be removed a day or two after the procedure. Summary Minimally invasive cholecystectomy, also called laparoscopic cholecystectomy, is surgery to remove the gallbladder using small incisions. Tell your health care provider about all the medical conditions you have and all the medicines you are taking for those conditions. Before the procedure, follow instructions about when to stop eating and drinking and changing or stopping medicines. Plan to have a responsible adult care for you for the time you are told after you leave the hospital or clinic. This information is not intended to replace advice given to you by your health care provider. Make sure you discuss any questions you have with your health care  provider. Document Revised: 11/24/2020 Document Reviewed: 11/24/2020 Elsevier Patient Education  [place].

## 2022-04-04 NOTE — Progress Notes (Signed)
04/04/2022  Reason for Visit: Symptomatic cholelithiasis  History of Present Illness: [name] is a 51 y.o. female senting for evaluation of symptomatic cholelithiasis.  Patient was seen in the emergency room on 03/31/2022 with right upper quadrant abdominal pain associated with nausea and vomiting.  The patient had just eaten [place] food night.  Work-up in the emergency room showed a normal white blood cell count of 10.5, with normal LFTs and a mildly elevated lipase of 75.  Ultrasound done showed cholelithiasis with very mild wall thickening and a normal size common bile duct.  Patient was able to be discharged to home the same night.  She was given return precautions and instructed on a low-fat diet.  The patient has been having salads and grilled foods since then over the weekend but she still having some milder episodes of discomfort.  The discomfort has not been as severe as when she went to the emergency room and has not had any nausea or vomiting.  The patient reports that prior to that emergency room visit, she had a similar episode a couple of weeks before hand that went away on its own.  Currently denies any fevers, chills, chest pain, shortness of breath.  She does report soreness in the right upper quadrant under the ribs.  Past Medical History: Past Medical History:  Diagnosis Date   Akathisia 12/17/2014   Bipolar affective ([place])    Restless leg syndrome    Schizoaffective disorder (HCC)    Schizoaffective disorder (HCC)      Past Surgical History: Past Surgical History:  Procedure Laterality Date   APPENDECTOMY     CESAREAN SECTION     COLONOSCOPY WITH PROPOFOL N/A 01/28/2021   Procedure: COLONOSCOPY WITH PROPOFOL;  Surgeon: [name], MD;  Location: [facility] ENDOSCOPY;  Service: Endoscopy;  Laterality: N/A;   LAPAROSCOPIC APPENDECTOMY N/A 07/02/2020   Procedure: APPENDECTOMY LAPAROSCOPIC;  Surgeon: Fredirick Maudlin, MD;  Location: [facility] ORS;  Service: General;   Laterality: N/A;    Home Medications: Prior to Admission medications   Medication Sig Start Date End Date Taking? Authorizing Provider  ARIPiprazole (ABILIFY) 10 MG tablet Take 1 tablet (10 mg total) by mouth daily for 7 days. 01/11/22 04/04/22 Yes Hisada, Elie Goody, MD  ARIPiprazole (ABILIFY) 20 MG tablet Take 1 tablet (20 mg total) by mouth daily. 03/16/22 06/14/22 Yes Hisada, Elie Goody, MD  ARIPiprazole ER (ABILIFY MAINTENA) 400 MG PRSY prefilled syringe Inject 400 mg into the muscle every 28 (twenty-eight) days. 03/11/22 09/07/22 Yes Norman Clay, MD  divalproex (DEPAKOTE ER) 500 MG 24 hr tablet Take 2 tablets (1,000 mg total) by mouth daily. 01/24/22 04/24/22 Yes Hisada, Elie Goody, MD  estradiol (ESTRACE) 0.1 MG/GM vaginal cream Place 0.5 g vaginally twice a week 04/13/21  Yes   estradiol (ESTRACE) 0.1 MG/GM vaginal cream Place vaginally. 04/13/21  Yes [provider]  ferrous sulfate 325 (65 FE) MG tablet Take 325 mg by mouth every other day.   Yes [provider]  HYDROcodone-acetaminophen (NORCO/VICODIN) 5-325 MG tablet Take 1 tablet by mouth every 6 (six) hours as needed. 04/01/22  Yes Ward, Kristen N, DO  ondansetron (ZOFRAN-ODT) 4 MG disintegrating tablet Take 1 tablet (4 mg total) by mouth every 6 (six) hours as needed for nausea or vomiting. 04/01/22  Yes Ward, Delice Bison, DO  pramipexole (MIRAPEX) 0.125 MG tablet Take 1 tablet (0.125 mg total) by mouth every evening. 07/12/21  Yes Burnard Hawthorne, FNP    Allergies: No Known Allergies  Social History:  reports that she has been smoking cigarettes. She has a 5.00 pack-year smoking history. She has never used smokeless tobacco. She reports that she does not drink alcohol and does not use drugs.   Family History: Family History  Problem Relation Age of Onset   Drug abuse Maternal Aunt    Suicidality Cousin     Review of Systems: Review of Systems  Constitutional:  Negative for chills and fever.  HENT:  Negative for hearing  loss.   Respiratory:  Negative for shortness of breath.   Cardiovascular:  Negative for chest pain.  Gastrointestinal:  Positive for abdominal pain, nausea and vomiting. Negative for constipation and diarrhea.  Genitourinary:  Negative for dysuria.  Musculoskeletal:  Negative for myalgias.  Skin:  Negative for rash.  Neurological:  Negative for dizziness.  Psychiatric/Behavioral:  Negative for depression.     Physical Exam BP 121/78   Pulse 77   Temp 97.9 F (36.6 C) (Oral)   Ht _0  (1.575 m)   Wt 223 lb 9.6 oz (101.4 kg)   LMP 12/17/2014   SpO2 95%   BMI 40.90 kg/m  CONSTITUTIONAL: No acute distress HEENT:  Normocephalic, atraumatic, extraocular motion intact. NECK: Trachea is midline, and there is no jugular venous distension.  RESPIRATORY:  Lungs are clear, and breath sounds are equal bilaterally. Normal respiratory effort without pathologic use of accessory muscles. CARDIOVASCULAR: Heart is regular without murmurs, gallops, or rubs. GI: The abdomen is soft, non-distended, with soreness in the RUQ and epigastric area.  Negative Murphy's sign.   MUSCULOSKELETAL:  Normal muscle strength and tone in all four extremities.  No peripheral edema or cyanosis. SKIN: Skin turgor is normal. There are no pathologic skin lesions.  NEUROLOGIC:  Motor and sensation is grossly normal.  Cranial nerves are grossly intact. PSYCH:  Alert and oriented to person, place and time. Affect is normal.  Laboratory Analysis: Labs from 03/31/22: Na 141, K 3.7, Cl 108, CO2 23, BUN 15, Cr 0.77.  Total bili 0.5, AST 26, ALT 28, Alk Phos 86, Lipase 75, albumin 4.2.  WBC 10.5, Hgb 14.1, Hct 43.2, Plt 363  Imaging: Ultrasound RUQ 03/31/22: IMPRESSION: Cholelithiasis and mild wall thickening although a negative sonographic Murphy's sign is elicited.   Fatty liver.  Assessment and Plan: This is a 51 y.o. female with symptomatic cholelithiasis.  -Discussed with the patient the findings on her ultrasound  and laboratory studies.  Her ultrasound does show cholelithiasis and at the time although there was very mild wall thickening, she was able to be discharged home from the emergency room.  However she continues having soreness and pain in the right upper quadrant although milder compared to her baseline emergency room.  She has made some dietary changes with still some persistent issues.  As such, I think it would be more beneficial to proceed with surgical management in the form of robotic assisted cholecystectomy.  I do not think that trying a low-fat diet is going to help much since it is already not doing a lot of improvement. -The patient reports that she is currently changing jobs and her insurance ends at the end of this month.  As such, we will schedule her more urgently for tomorrow for robotic assisted cholecystectomy.  Reviewed the surgery at length with her including the use, risks of bleeding, infection, injury to surrounding structures, that this would be an outpatient procedure, the use of ICG to evaluate the biliary anatomy, pain control, activity restrictions, she is willing to proceed. -  Patient scheduled for tomorrow 04/05/2022.  All of her questions have been answered.  I spent 60 minutes dedicated to the care of this patient on the date of this encounter to include pre-visit review of records, face-to-face time with the patient discussing diagnosis and management, and any post-visit coordination of care.   Melvyn Neth, [facility]

## 2022-04-04 NOTE — H&P (View-Only) (Signed)
04/04/2022  Reason for Visit: Symptomatic cholelithiasis  History of Present Illness: [name] is a 51 y.o. female senting for evaluation of symptomatic cholelithiasis.  Patient was seen in the emergency room on 03/31/2022 with right upper quadrant abdominal pain associated with nausea and vomiting.  The patient had just eaten [place] food night.  Work-up in the emergency room showed a normal white blood cell count of 10.5, with normal LFTs and a mildly elevated lipase of 75.  Ultrasound done showed cholelithiasis with very mild wall thickening and a normal size common bile duct.  Patient was able to be discharged to home the same night.  She was given return precautions and instructed on a low-fat diet.  The patient has been having salads and grilled foods since then over the weekend but she still having some milder episodes of discomfort.  The discomfort has not been as severe as when she went to the emergency room and has not had any nausea or vomiting.  The patient reports that prior to that emergency room visit, she had a similar episode a couple of weeks before hand that went away on its own.  Currently denies any fevers, chills, chest pain, shortness of breath.  She does report soreness in the right upper quadrant under the ribs.  Past Medical History: Past Medical History:  Diagnosis Date   Akathisia 12/17/2014   Bipolar affective ([place])    Restless leg syndrome    Schizoaffective disorder (HCC)    Schizoaffective disorder (HCC)      Past Surgical History: Past Surgical History:  Procedure Laterality Date   APPENDECTOMY     CESAREAN SECTION     COLONOSCOPY WITH PROPOFOL N/A 01/28/2021   Procedure: COLONOSCOPY WITH PROPOFOL;  Surgeon: [name], MD;  Location: [facility] ENDOSCOPY;  Service: Endoscopy;  Laterality: N/A;   LAPAROSCOPIC APPENDECTOMY N/A 07/02/2020   Procedure: APPENDECTOMY LAPAROSCOPIC;  Surgeon: Fredirick Maudlin, MD;  Location: [facility] ORS;  Service: General;   Laterality: N/A;    Home Medications: Prior to Admission medications   Medication Sig Start Date End Date Taking? Authorizing Provider  ARIPiprazole (ABILIFY) 10 MG tablet Take 1 tablet (10 mg total) by mouth daily for 7 days. 01/11/22 04/04/22 Yes Hisada, Elie Goody, MD  ARIPiprazole (ABILIFY) 20 MG tablet Take 1 tablet (20 mg total) by mouth daily. 03/16/22 06/14/22 Yes Hisada, Elie Goody, MD  ARIPiprazole ER (ABILIFY MAINTENA) 400 MG PRSY prefilled syringe Inject 400 mg into the muscle every 28 (twenty-eight) days. 03/11/22 09/07/22 Yes Norman Clay, MD  divalproex (DEPAKOTE ER) 500 MG 24 hr tablet Take 2 tablets (1,000 mg total) by mouth daily. 01/24/22 04/24/22 Yes Hisada, Elie Goody, MD  estradiol (ESTRACE) 0.1 MG/GM vaginal cream Place 0.5 g vaginally twice a week 04/13/21  Yes   estradiol (ESTRACE) 0.1 MG/GM vaginal cream Place vaginally. 04/13/21  Yes [provider]  ferrous sulfate 325 (65 FE) MG tablet Take 325 mg by mouth every other day.   Yes [provider]  HYDROcodone-acetaminophen (NORCO/VICODIN) 5-325 MG tablet Take 1 tablet by mouth every 6 (six) hours as needed. 04/01/22  Yes Ward, Kristen N, DO  ondansetron (ZOFRAN-ODT) 4 MG disintegrating tablet Take 1 tablet (4 mg total) by mouth every 6 (six) hours as needed for nausea or vomiting. 04/01/22  Yes Ward, Delice Bison, DO  pramipexole (MIRAPEX) 0.125 MG tablet Take 1 tablet (0.125 mg total) by mouth every evening. 07/12/21  Yes Burnard Hawthorne, FNP    Allergies: No Known Allergies  Social History:  reports that she has been smoking cigarettes. She has a 5.00 pack-year smoking history. She has never used smokeless tobacco. She reports that she does not drink alcohol and does not use drugs.   Family History: Family History  Problem Relation Age of Onset   Drug abuse Maternal Aunt    Suicidality Cousin     Review of Systems: Review of Systems  Constitutional:  Negative for chills and fever.  HENT:  Negative for hearing  loss.   Respiratory:  Negative for shortness of breath.   Cardiovascular:  Negative for chest pain.  Gastrointestinal:  Positive for abdominal pain, nausea and vomiting. Negative for constipation and diarrhea.  Genitourinary:  Negative for dysuria.  Musculoskeletal:  Negative for myalgias.  Skin:  Negative for rash.  Neurological:  Negative for dizziness.  Psychiatric/Behavioral:  Negative for depression.     Physical Exam BP 121/78   Pulse 77   Temp 97.9 F (36.6 C) (Oral)   Ht _0  (1.575 m)   Wt 223 lb 9.6 oz (101.4 kg)   LMP 12/17/2014   SpO2 95%   BMI 40.90 kg/m  CONSTITUTIONAL: No acute distress HEENT:  Normocephalic, atraumatic, extraocular motion intact. NECK: Trachea is midline, and there is no jugular venous distension.  RESPIRATORY:  Lungs are clear, and breath sounds are equal bilaterally. Normal respiratory effort without pathologic use of accessory muscles. CARDIOVASCULAR: Heart is regular without murmurs, gallops, or rubs. GI: The abdomen is soft, non-distended, with soreness in the RUQ and epigastric area.  Negative Murphy's sign.   MUSCULOSKELETAL:  Normal muscle strength and tone in all four extremities.  No peripheral edema or cyanosis. SKIN: Skin turgor is normal. There are no pathologic skin lesions.  NEUROLOGIC:  Motor and sensation is grossly normal.  Cranial nerves are grossly intact. PSYCH:  Alert and oriented to person, place and time. Affect is normal.  Laboratory Analysis: Labs from 03/31/22: Na 141, K 3.7, Cl 108, CO2 23, BUN 15, Cr 0.77.  Total bili 0.5, AST 26, ALT 28, Alk Phos 86, Lipase 75, albumin 4.2.  WBC 10.5, Hgb 14.1, Hct 43.2, Plt 363  Imaging: Ultrasound RUQ 03/31/22: IMPRESSION: Cholelithiasis and mild wall thickening although a negative sonographic Murphy's sign is elicited.   Fatty liver.  Assessment and Plan: This is a 51 y.o. female with symptomatic cholelithiasis.  -Discussed with the patient the findings on her ultrasound  and laboratory studies.  Her ultrasound does show cholelithiasis and at the time although there was very mild wall thickening, she was able to be discharged home from the emergency room.  However she continues having soreness and pain in the right upper quadrant although milder compared to her baseline emergency room.  She has made some dietary changes with still some persistent issues.  As such, I think it would be more beneficial to proceed with surgical management in the form of robotic assisted cholecystectomy.  I do not think that trying a low-fat diet is going to help much since it is already not doing a lot of improvement. -The patient reports that she is currently changing jobs and her insurance ends at the end of this month.  As such, we will schedule her more urgently for tomorrow for robotic assisted cholecystectomy.  Reviewed the surgery at length with her including the use, risks of bleeding, infection, injury to surrounding structures, that this would be an outpatient procedure, the use of ICG to evaluate the biliary anatomy, pain control, activity restrictions, she is willing to proceed. -  Patient scheduled for tomorrow 04/05/2022.  All of her questions have been answered.  I spent 60 minutes dedicated to the care of this patient on the date of this encounter to include pre-visit review of records, face-to-face time with the patient discussing diagnosis and management, and any post-visit coordination of care.   Melvyn Neth, [facility]

## 2022-04-04 NOTE — Telephone Encounter (Signed)
Patient has been advised of Pre-Admission date/time, and Surgery date at [facility].  Surgery Date: 04/05/22 Preadmission Testing Date: 04/05/22, patient informed to arrive 2 hours early at [facility] 2nd floor same day surgery.

## 2022-04-05 ENCOUNTER — Encounter
Admission: RE | Disposition: A | Discharge status: Home / Self Care | Payer: Self-pay | Source: Home / Self Care | Attending: Surgery

## 2022-04-05 ENCOUNTER — Ambulatory Visit
Admission: RE | Admit: 2022-04-05 | Discharge: 2022-04-05 | Disposition: A | Discharge status: Home / Self Care | Payer: Managed Care, Other (non HMO) | Attending: Surgery | Admitting: Surgery

## 2022-04-05 ENCOUNTER — Ambulatory Visit: Payer: Managed Care, Other (non HMO) | Admitting: Certified Registered"

## 2022-04-05 ENCOUNTER — Other Ambulatory Visit: Payer: Self-pay

## 2022-04-05 DIAGNOSIS — K760 Fatty (change of) liver, not elsewhere classified: Secondary | ICD-10-CM | POA: Insufficient documentation

## 2022-04-05 DIAGNOSIS — F259 Schizoaffective disorder, unspecified: Secondary | ICD-10-CM | POA: Insufficient documentation

## 2022-04-05 DIAGNOSIS — K8000 Calculus of gallbladder with acute cholecystitis without obstruction: Secondary | ICD-10-CM | POA: Diagnosis not present

## 2022-04-05 DIAGNOSIS — F17210 Nicotine dependence, cigarettes, uncomplicated: Secondary | ICD-10-CM | POA: Diagnosis not present

## 2022-04-05 DIAGNOSIS — K8020 Calculus of gallbladder without cholecystitis without obstruction: Secondary | ICD-10-CM

## 2022-04-05 DIAGNOSIS — F319 Bipolar disorder, unspecified: Secondary | ICD-10-CM | POA: Insufficient documentation

## 2022-04-05 DIAGNOSIS — K660 Peritoneal adhesions (postprocedural) (postinfection): Secondary | ICD-10-CM | POA: Insufficient documentation

## 2022-04-05 SURGERY — CHOLECYSTECTOMY, ROBOT-ASSISTED, LAPAROSCOPIC
Anesthesia: General | Site: Abdomen

## 2022-04-05 MED ORDER — INDOCYANINE GREEN 25 MG IV SOLR
2.5000 mg | INTRAVENOUS | Status: AC
  Administered 2022-04-05: 2.5 mg via INTRAVENOUS
  Filled 2022-04-05: qty 1

## 2022-04-05 MED ORDER — EPHEDRINE SULFATE (PRESSORS) 50 MG/ML IJ SOLN
INTRAMUSCULAR | Status: DC | PRN
  Administered 2022-04-05: 10 mg via INTRAVENOUS

## 2022-04-05 MED ORDER — ACETAMINOPHEN 10 MG/ML IV SOLN: 1000.0000 mg | Freq: Once | INTRAVENOUS | Status: DC | PRN

## 2022-04-05 MED ORDER — PROPOFOL 10 MG/ML IV BOLUS
INTRAVENOUS | Status: DC | PRN
  Administered 2022-04-05: 170 mg via INTRAVENOUS

## 2022-04-05 MED ORDER — FENTANYL CITRATE (PF) 100 MCG/2ML IJ SOLN
INTRAMUSCULAR | Status: DC | PRN
  Administered 2022-04-05 (×2): 50 ug via INTRAVENOUS

## 2022-04-05 MED ORDER — KETOROLAC TROMETHAMINE 30 MG/ML IJ SOLN
INTRAMUSCULAR | Status: DC | PRN
  Administered 2022-04-05: 30 mg via INTRAVENOUS

## 2022-04-05 MED ORDER — DEXMEDETOMIDINE HCL IN NACL 80 MCG/20ML IV SOLN
INTRAVENOUS | Status: DC | PRN
  Administered 2022-04-05: 8 ug via BUCCAL
  Administered 2022-04-05: 12 ug via BUCCAL

## 2022-04-05 MED ORDER — BUPIVACAINE HCL (PF) 0.25 % IJ SOLN
INTRAMUSCULAR | Status: AC
  Filled 2022-04-05: qty 30

## 2022-04-05 MED ORDER — DROPERIDOL 2.5 MG/ML IJ SOLN: 0.6250 mg | Freq: Once | INTRAMUSCULAR | Status: DC | PRN

## 2022-04-05 MED ORDER — ACETAMINOPHEN 500 MG PO TABS
ORAL_TABLET | ORAL | Status: AC
  Administered 2022-04-05: 1000 mg via ORAL
  Filled 2022-04-05: qty 2

## 2022-04-05 MED ORDER — ONDANSETRON HCL 4 MG/2ML IJ SOLN
INTRAMUSCULAR | Status: DC | PRN
  Administered 2022-04-05: 4 mg via INTRAVENOUS

## 2022-04-05 MED ORDER — ACETAMINOPHEN 500 MG PO TABS: 1000.0000 mg | ORAL_TABLET | ORAL | Status: AC

## 2022-04-05 MED ORDER — DEXAMETHASONE SODIUM PHOSPHATE 10 MG/ML IJ SOLN
INTRAMUSCULAR | Status: DC | PRN
  Administered 2022-04-05: 10 mg via INTRAVENOUS

## 2022-04-05 MED ORDER — GABAPENTIN 300 MG PO CAPS: 300.0000 mg | ORAL_CAPSULE | ORAL | Status: AC

## 2022-04-05 MED ORDER — BUPIVACAINE-EPINEPHRINE (PF) 0.5% -1:200000 IJ SOLN
INTRAMUSCULAR | Status: AC
  Filled 2022-04-05: qty 30

## 2022-04-05 MED ORDER — CHLORHEXIDINE GLUCONATE 0.12 % MT SOLN
OROMUCOSAL | Status: AC
  Administered 2022-04-05: 15 mL
  Filled 2022-04-05: qty 15

## 2022-04-05 MED ORDER — GLYCOPYRROLATE 0.2 MG/ML IJ SOLN
INTRAMUSCULAR | Status: AC
  Filled 2022-04-05: qty 1

## 2022-04-05 MED ORDER — PROMETHAZINE HCL 25 MG/ML IJ SOLN: 6.2500 mg | INTRAMUSCULAR | Status: DC | PRN

## 2022-04-05 MED ORDER — LIDOCAINE HCL (CARDIAC) PF 100 MG/5ML IV SOSY
PREFILLED_SYRINGE | INTRAVENOUS | Status: DC | PRN
  Administered 2022-04-05: 50 mg via INTRAVENOUS

## 2022-04-05 MED ORDER — SUGAMMADEX SODIUM 200 MG/2ML IV SOLN
INTRAVENOUS | Status: DC | PRN
  Administered 2022-04-05: 200 mg via INTRAVENOUS

## 2022-04-05 MED ORDER — HYDROMORPHONE HCL 1 MG/ML IJ SOLN
0.2500 mg | INTRAMUSCULAR | Status: DC | PRN
  Administered 2022-04-05: 0.5 mg via INTRAVENOUS

## 2022-04-05 MED ORDER — ROCURONIUM BROMIDE 100 MG/10ML IV SOLN
INTRAVENOUS | Status: DC | PRN
  Administered 2022-04-05: 50 mg via INTRAVENOUS
  Administered 2022-04-05: 20 mg via INTRAVENOUS

## 2022-04-05 MED ORDER — OXYCODONE HCL 5 MG PO TABS: 5.0000 mg | ORAL_TABLET | ORAL | 0 refills | Status: DC | PRN

## 2022-04-05 MED ORDER — CHLORHEXIDINE GLUCONATE CLOTH 2 % EX PADS
6.0000 | MEDICATED_PAD | Freq: Once | CUTANEOUS | Status: AC
  Administered 2022-04-05: 6 via TOPICAL

## 2022-04-05 MED ORDER — BUPIVACAINE-EPINEPHRINE 0.5% -1:200000 IJ SOLN
INTRAMUSCULAR | Status: DC | PRN
  Administered 2022-04-05: 30 mL

## 2022-04-05 MED ORDER — OXYCODONE HCL 5 MG PO TABS
5.0000 mg | ORAL_TABLET | Freq: Once | ORAL | Status: AC | PRN
  Administered 2022-04-05: 5 mg via ORAL

## 2022-04-05 MED ORDER — HYDROMORPHONE HCL 1 MG/ML IJ SOLN
INTRAMUSCULAR | Status: AC
  Administered 2022-04-05: 0.5 mg via INTRAVENOUS
  Filled 2022-04-05: qty 1

## 2022-04-05 MED ORDER — MIDAZOLAM HCL 2 MG/2ML IJ SOLN
INTRAMUSCULAR | Status: DC | PRN
  Administered 2022-04-05: 2 mg via INTRAVENOUS

## 2022-04-05 MED ORDER — LABETALOL HCL 5 MG/ML IV SOLN
INTRAVENOUS | Status: DC | PRN
  Administered 2022-04-05: 5 mg via INTRAVENOUS

## 2022-04-05 MED ORDER — LACTATED RINGERS IV SOLN: INTRAVENOUS | Status: DC | PRN

## 2022-04-05 MED ORDER — FENTANYL CITRATE (PF) 100 MCG/2ML IJ SOLN
INTRAMUSCULAR | Status: AC
  Filled 2022-04-05: qty 2

## 2022-04-05 MED ORDER — MIDAZOLAM HCL 2 MG/2ML IJ SOLN
INTRAMUSCULAR | Status: AC
  Filled 2022-04-05: qty 2

## 2022-04-05 MED ORDER — CHLORHEXIDINE GLUCONATE CLOTH 2 % EX PADS: 6.0000 | MEDICATED_PAD | Freq: Once | CUTANEOUS | Status: DC

## 2022-04-05 MED ORDER — GABAPENTIN 300 MG PO CAPS
ORAL_CAPSULE | ORAL | Status: AC
  Administered 2022-04-05: 300 mg via ORAL
  Filled 2022-04-05: qty 1

## 2022-04-05 MED ORDER — OXYCODONE HCL 5 MG PO TABS
ORAL_TABLET | ORAL | Status: AC
  Filled 2022-04-05: qty 1

## 2022-04-05 MED ORDER — CEFAZOLIN SODIUM-DEXTROSE 2-4 GM/100ML-% IV SOLN
INTRAVENOUS | Status: AC
  Filled 2022-04-05: qty 100

## 2022-04-05 MED ORDER — CEFAZOLIN SODIUM-DEXTROSE 2-4 GM/100ML-% IV SOLN
2.0000 g | INTRAVENOUS | Status: AC
  Administered 2022-04-05: 2 g via INTRAVENOUS

## 2022-04-05 MED ORDER — IBUPROFEN 800 MG PO TABS: 800.0000 mg | ORAL_TABLET | Freq: Three times a day (TID) | ORAL | 1 refills | Status: DC | PRN

## 2022-04-05 MED ORDER — EPINEPHRINE PF 1 MG/ML IJ SOLN
INTRAMUSCULAR | Status: AC
  Filled 2022-04-05: qty 1

## 2022-04-05 MED ORDER — ACETAMINOPHEN 500 MG PO TABS: 1000.0000 mg | ORAL_TABLET | Freq: Four times a day (QID) | ORAL | Status: DC | PRN

## 2022-04-05 MED ORDER — OXYCODONE HCL 5 MG/5ML PO SOLN: 5.0000 mg | Freq: Once | ORAL | Status: AC | PRN

## 2022-04-05 SURGICAL SUPPLY — 61 items
BAG PRESSURE INF REUSE 1000 (BAG) IMPLANT
BULB RESERV EVAC DRAIN JP 100C (MISCELLANEOUS) IMPLANT
CANNULA REDUC XI 12-8 STAPL (CANNULA) ×2
CANNULA REDUCER 12-8 DVNC XI (CANNULA) ×2 IMPLANT
CLIP LIGATING HEM O LOK PURPLE (MISCELLANEOUS) IMPLANT
CLIP LIGATING HEMO O LOK GREEN (MISCELLANEOUS) ×2 IMPLANT
CUP MEDICINE 2OZ PLAST GRAD ST (MISCELLANEOUS) ×2 IMPLANT
DERMABOND ADVANCED .7 DNX12 (GAUZE/BANDAGES/DRESSINGS) ×2 IMPLANT
DRAIN CHANNEL JP 19F (MISCELLANEOUS) IMPLANT
DRAPE ARM DVNC X/XI (DISPOSABLE) ×8 IMPLANT
DRAPE COLUMN DVNC XI (DISPOSABLE) ×2 IMPLANT
DRAPE DA VINCI XI ARM (DISPOSABLE) ×8
DRAPE DA VINCI XI COLUMN (DISPOSABLE) ×2
DRSG TEGADERM 4X4.75 (GAUZE/BANDAGES/DRESSINGS) IMPLANT
ELECT CAUTERY BLADE TIP 2.5 (TIP) ×2
ELECT REM PT RETURN 9FT ADLT (ELECTROSURGICAL) ×2
ELECTRODE CAUTERY BLDE TIP 2.5 (TIP) ×2 IMPLANT
ELECTRODE REM PT RTRN 9FT ADLT (ELECTROSURGICAL) ×2 IMPLANT
GAUZE SPONGE 4X4 12PLY STRL (GAUZE/BANDAGES/DRESSINGS) IMPLANT
GLOVE SURG SYN 7.0 (GLOVE) ×4 IMPLANT
GLOVE SURG SYN 7.0 PF PI (GLOVE) ×4 IMPLANT
GLOVE SURG SYN 7.5  E (GLOVE) ×4
GLOVE SURG SYN 7.5 E (GLOVE) ×4 IMPLANT
GLOVE SURG SYN 7.5 PF PI (GLOVE) ×4 IMPLANT
GOWN STRL REUS W/ TWL LRG LVL3 (GOWN DISPOSABLE) ×8 IMPLANT
GOWN STRL REUS W/TWL LRG LVL3 (GOWN DISPOSABLE) ×8
IRRIGATOR SUCT 8 DISP DVNC XI (IRRIGATION / IRRIGATOR) IMPLANT
IRRIGATOR SUCTION 8MM XI DISP (IRRIGATION / IRRIGATOR) ×2
IV NS 1000ML (IV SOLUTION) ×2
IV NS 1000ML BAXH (IV SOLUTION) IMPLANT
KIT PINK PAD W/HEAD ARE REST (MISCELLANEOUS) ×2
KIT PINK PAD W/HEAD ARM REST (MISCELLANEOUS) ×2 IMPLANT
LABEL OR SOLS (LABEL) ×2 IMPLANT
MANIFOLD NEPTUNE II (INSTRUMENTS) ×2 IMPLANT
NEEDLE HYPO 22GX1.5 SAFETY (NEEDLE) ×2 IMPLANT
NS IRRIG 500ML POUR BTL (IV SOLUTION) ×2 IMPLANT
OBTURATOR OPTICAL STANDARD 8MM (TROCAR) ×2
OBTURATOR OPTICAL STND 8 DVNC (TROCAR) ×2
OBTURATOR OPTICALSTD 8 DVNC (TROCAR) ×2 IMPLANT
PACK LAP CHOLECYSTECTOMY (MISCELLANEOUS) ×2 IMPLANT
PENCIL SMOKE EVACUATOR (MISCELLANEOUS) ×2 IMPLANT
SEAL CANN UNIV 5-8 DVNC XI (MISCELLANEOUS) ×6 IMPLANT
SEAL XI 5MM-8MM UNIVERSAL (MISCELLANEOUS) ×6
SET TUBE SMOKE EVAC HIGH FLOW (TUBING) ×2 IMPLANT
SOLUTION ELECTROLUBE (MISCELLANEOUS) ×2 IMPLANT
SPIKE FLUID TRANSFER (MISCELLANEOUS) ×2 IMPLANT
SPONGE T-LAP 18X18 ~~LOC~~+RFID (SPONGE) IMPLANT
SPONGE T-LAP 4X18 ~~LOC~~+RFID (SPONGE) ×2 IMPLANT
STAPLER CANNULA SEAL DVNC XI (STAPLE) ×2 IMPLANT
STAPLER CANNULA SEAL XI (STAPLE) ×2
SUT ETHILON 3-0 FS-10 30 BLK (SUTURE) ×2
SUT MNCRL AB 4-0 PS2 18 (SUTURE) ×2 IMPLANT
SUT VIC AB 3-0 SH 27 (SUTURE)
SUT VIC AB 3-0 SH 27X BRD (SUTURE) IMPLANT
SUT VICRYL 0 AB UR-6 (SUTURE) ×4 IMPLANT
SUTURE EHLN 3-0 FS-10 30 BLK (SUTURE) IMPLANT
SYS BAG RETRIEVAL 10MM (BASKET) ×2
SYSTEM BAG RETRIEVAL 10MM (BASKET) ×2 IMPLANT
TRAP FLUID SMOKE EVACUATOR (MISCELLANEOUS) ×2 IMPLANT
TUBING CONNECTING 10 (TUBING) IMPLANT
WATER STERILE IRR 500ML POUR (IV SOLUTION) ×2 IMPLANT

## 2022-04-05 NOTE — Op Note (Signed)
Procedure Date:  04/05/2022  Pre-operative Diagnosis:  Symptomatic cholelithiasis  Post-operative Diagnosis: Acute cholecystitis  Procedure:  Robotic assisted cholecystectomy with ICG FireFly cholangiogram  Surgeon:  Melvyn Neth, MD  Anesthesia:  General endotracheal  Estimated Blood Loss:  20 ml  Specimens:  gallbladder  Complications:  None  Indications for Procedure:  This is a 51 y.o. female who presents with abdominal pain and workup revealing symptomatic cholelithiasis.  The benefits, complications, treatment options, and expected outcomes were discussed with the patient. The risks of bleeding, infection, recurrence of symptoms, failure to resolve symptoms, bile duct damage, bile duct leak, retained common bile duct stone, bowel injury, and need for further procedures were all discussed with the patient and she was willing to proceed.  Description of Procedure: The patient was correctly identified in the preoperative area and brought into the operating room.  The patient was placed supine with VTE prophylaxis in place.  Appropriate time-outs were performed.  Anesthesia was induced and the patient was intubated.  Appropriate antibiotics were infused.  The abdomen was prepped and draped in a sterile fashion. An infraumbilical incision was made. A cutdown technique was used to enter the abdominal cavity without injury, and a 12 mm robotic port was inserted.  Pneumoperitoneum was obtained with appropriate opening pressures.  Three 8-mm ports were placed in the mid abdomen at the level of the umbilicus under direct visualization.  The DaVinci platform was docked, camera targeted, and instruments were placed under direct visualization.  The patient had adhesions of the omentum to the anterior abdominal wall from her prior laparoscopic appendectomy.  These were taken down using cautery.  The gallbladder was identified and found to be distended and inflamed.  Needle decompression was  done, but the bile was very thick and only about 20 ml were aspirated.  The fundus was grasped and retracted cephalad.  Adhesions were lysed bluntly and with electrocautery. The infundibulum was grasped and retracted laterally, exposing the peritoneum overlying the gallbladder.  This was incised with electrocautery and extended on either side of the gallbladder.  There was edema around the neck of the gallbladder and cystic duct areas.  FireFly cholangiogram was then obtained, and we were able to clearly identify the cystic duct and common bile duct.  The cystic duct and cystic artery were carefully dissected with combination of cautery and blunt dissection.  Both were clipped twice proximally and once distally, cutting in between.  The gallbladder was taken from the gallbladder fossa in a retrograde fashion with electrocautery. The gallbladder was placed in an Endocatch bag. The liver bed was inspected and any bleeding was controlled with electrocautery. The right upper quadrant was then inspected again revealing intact clips, no bleeding, and no ductal injury.  The area was thoroughly irrigated.  A 19 Fr. Blake drain was placed via the right lateral port and into the RUQ and gallbladder fossa.  The 8 mm ports were removed under direct visualization and the 12 mm port was removed.  The Endocatch bag was brought out via the umbilical incision. The fascial opening was closed using 0 vicryl suture.  Local anesthetic was infused in all incisions and the incisions were closed with 4-0 Monocryl.  The drain was secured using 3-0 Nylon.  The wounds were cleaned and sealed with DermaBond.  The drain was dressed with 4x4 gauze and TegaDerm.  The patient was emerged from anesthesia and extubated and brought to the recovery room for further management.  The patient tolerated the procedure  well and all counts were correct at the end of the case.   Melvyn Neth, MD

## 2022-04-05 NOTE — Discharge Instructions (Addendum)
Discharge Instructions: 1.  Patient may shower, but do not scrub wounds heavily and dab dry only. 2.  Do not submerge wounds in pool/tub until fully healed. 3.  Do not apply ointments or hydrogen peroxide to the wounds. 4.  May apply ice packs to the wounds for comfort. 5.  Please empty and record drain output twice daily and as needed to keep the bulb only half full at max.   6.  Dressing:  change drain dressing once daily with 4x4 gauze and tape. 7.  No heavy lifting or pushing of more than 10-15 lbs for 4 weeks. 8.  Do not drive while taking narcotics for pain control.  Prior to driving, make sure you are able to rotate right and left to look at blindspots without significant pain or discomfort.   AMBULATORY SURGERY  DISCHARGE INSTRUCTIONS   The drugs that you were given will stay in your system until tomorrow so for the next 24 hours you should not:  Drive an automobile Make any legal decisions Drink any alcoholic beverage   You may resume regular meals tomorrow.  Today it is better to start with liquids and gradually work up to solid foods.  You may eat anything you prefer, but it is better to start with liquids, then soup and crackers, and gradually work up to solid foods.   Please notify your doctor immediately if you have any unusual bleeding, trouble breathing, redness and pain at the surgery site, drainage, fever, or pain not relieved by medication.    Additional Instructions:   Please contact your physician with any problems or Same Day Surgery at [phone], Monday through Friday 6 am to 4 pm, or [place] at [facility] number at [phone].

## 2022-04-05 NOTE — Anesthesia Procedure Notes (Signed)
Procedure Name: Intubation Date/Time: 04/05/2022 5:27 PM  Performed by: Beverely Low, CRNAPre-anesthesia Checklist: Patient identified, Patient being monitored, Timeout performed, Emergency Drugs available and Suction available Patient Re-evaluated:Patient Re-evaluated prior to induction Oxygen Delivery Method: Circle system utilized Preoxygenation: Pre-oxygenation with 100% oxygen Induction Type: IV induction Ventilation: Mask ventilation without difficulty Laryngoscope Size: 3 and McGraph Grade View: Grade I Tube type: Oral Tube size: 7.0 mm Number of attempts: 1 Airway Equipment and Method: Stylet Placement Confirmation: ETT inserted through vocal cords under direct vision, positive ETCO2 and breath sounds checked- equal and bilateral Secured at: 21 cm Tube secured with: Tape Dental Injury: Teeth and Oropharynx as per pre-operative assessment

## 2022-04-05 NOTE — Anesthesia Preprocedure Evaluation (Addendum)
Anesthesia Evaluation  Patient identified by MRN, date of birth, ID band Patient awake    Reviewed: Allergy & Precautions, NPO status , Patient's Chart, lab work & pertinent test results  Airway Mallampati: III  TM Distance: >3 FB Neck ROM: full    Dental  (+) Chipped   Pulmonary neg pulmonary ROS, Current Smoker and Patient abstained from smoking.,    Pulmonary exam normal        Cardiovascular negative cardio ROS Normal cardiovascular exam  12/21 ECHO 1. Left ventricular ejection fraction, by estimation, is 65 to 70%. The  left ventricle has normal function. The left ventricle has no regional  wall motion abnormalities. There is mild left ventricular hypertrophy.  Left ventricular diastolic parameters  are consistent with Grade I diastolic dysfunction (impaired relaxation).  2. Right ventricular systolic function is normal. The right ventricular  size is mildly enlarged.  3. Left atrial size was mildly dilated.  4. Right atrial size was mildly dilated.  5. The mitral valve is grossly normal. Trivial mitral valve  regurgitation.  6. The aortic valve was not well visualized. Aortic valve regurgitation  is trivial.    Neuro/Psych negative neurological ROS  negative psych ROS   GI/Hepatic negative GI ROS, Neg liver ROS,   Endo/Other  negative endocrine ROS  Renal/GU      Musculoskeletal   Abdominal   Peds  Hematology negative hematology ROS (+)   Anesthesia Other Findings Past Medical History: 12/17/2014: Akathisia No date: Bipolar affective ([place]) No date: Restless leg syndrome No date: Schizoaffective disorder ([place]) No date: Schizoaffective disorder ([place])  Past Surgical History: No date: APPENDECTOMY No date: CESAREAN SECTION 01/28/2021: COLONOSCOPY WITH PROPOFOL; N/A     Comment:  Procedure: COLONOSCOPY WITH PROPOFOL;  Surgeon: [name], MD;  Location: [facility] ENDOSCOPY;  Service:                Endoscopy;  Laterality: N/A; 07/02/2020: LAPAROSCOPIC APPENDECTOMY; N/A     Comment:  Procedure: APPENDECTOMY LAPAROSCOPIC;  Surgeon: Fredirick Maudlin, MD;  Location: [facility] ORS;  Service: General;                Laterality: N/A;  BMI    Body Mass Index: 40.79 kg/m      Reproductive/Obstetrics negative OB ROS                             Anesthesia Physical Anesthesia Plan  ASA: 3  Anesthesia Plan: General ETT   Post-op Pain Management: Tylenol PO (pre-op)*, Gabapentin PO (pre-op)* and Toradol IV (intra-op)*   Induction: Intravenous  PONV Risk Score and Plan: Ondansetron, Dexamethasone, Midazolam and Treatment may vary due to age or medical condition  Airway Management Planned: Oral ETT  Additional Equipment:   Intra-op Plan:   Post-operative Plan: Extubation in OR  Informed Consent: I have reviewed the patients History and Physical, chart, labs and discussed the procedure including the risks, benefits and alternatives for the proposed anesthesia with the patient or authorized representative who has indicated his/her understanding and acceptance.     Dental Advisory Given  Plan Discussed with: Anesthesiologist, CRNA and Surgeon  Anesthesia Plan Comments: (Patient consented for risks of anesthesia including but not limited to:  - adverse reactions to medications - damage to eyes, teeth, lips or other  oral mucosa - nerve damage due to positioning  - sore throat or hoarseness - Damage to heart, brain, nerves, lungs, other parts of body or loss of life  Patient voiced understanding.)       Anesthesia Quick Evaluation

## 2022-04-05 NOTE — Transfer of Care (Addendum)
Immediate Anesthesia Transfer of Care Note  Patient: [name]  Procedure(s) Performed: XI ROBOTIC ASSISTED LAPAROSCOPIC CHOLECYSTECTOMY INDOCYANINE GREEN FLUORESCENCE IMAGING (ICG)  Patient Location: PACU  Anesthesia Type:General  Level of Consciousness: drowsy  Airway & Oxygen Therapy: Patient Spontanous Breathing and Patient connected to face mask oxygen  Post-op Assessment: Report given to RN and Post -op Vital signs reviewed and stable  Post vital signs: Reviewed and stable  Last Vitals:  Vitals Value Taken Time  BP    Temp    Pulse    Resp    SpO2      Last Pain:  Vitals:   04/05/22 1345  TempSrc: Oral         Complications: No notable events documented.

## 2022-04-05 NOTE — Interval H&P Note (Signed)
History and Physical Interval Note:  04/05/2022 4:53 PM  [name]  has presented today for surgery, with the diagnosis of Gallstones.  The various methods of treatment have been discussed with the patient and family. After consideration of risks, benefits and other options for treatment, the patient has consented to  Procedure(s): XI ROBOTIC ASSISTED LAPAROSCOPIC CHOLECYSTECTOMY (N/A) [place] (ICG) (N/A) as a surgical intervention.  The patient's history has been reviewed, patient examined, no change in status, stable for surgery.  I have reviewed the patient's chart and labs.  Questions were answered to the patient's satisfaction.     [name]

## 2022-04-06 NOTE — Anesthesia Postprocedure Evaluation (Signed)
Anesthesia Post Note  Patient: [name]  Procedure(s) Performed: XI ROBOTIC ASSISTED LAPAROSCOPIC CHOLECYSTECTOMY (Abdomen) INDOCYANINE GREEN FLUORESCENCE IMAGING (ICG)  Patient location during evaluation: PACU Anesthesia Type: General Level of consciousness: awake and alert Pain management: pain level controlled Vital Signs Assessment: post-procedure vital signs reviewed and stable Respiratory status: spontaneous breathing, nonlabored ventilation and respiratory function stable Cardiovascular status: blood pressure returned to baseline and stable Postop Assessment: no apparent nausea or vomiting Anesthetic complications: no   No notable events documented.   Last Vitals:  Vitals:   04/05/22 2055 04/05/22 2100  BP:  117/66  Pulse: 85 80  Resp: (!) 27 19  Temp:  (!) 36.3 C  SpO2: 93% 92%    Last Pain:  Vitals:   04/05/22 2100  TempSrc:   PainSc: 0-No pain                 [place] Ouch

## 2022-04-07 LAB — SURGICAL PATHOLOGY

## 2022-04-12 ENCOUNTER — Ambulatory Visit (INDEPENDENT_AMBULATORY_CARE_PROVIDER_SITE_OTHER): Payer: Managed Care, Other (non HMO) | Admitting: Physician Assistant

## 2022-04-12 ENCOUNTER — Other Ambulatory Visit: Payer: Self-pay

## 2022-04-12 ENCOUNTER — Encounter: Payer: Self-pay | Admitting: Physician Assistant

## 2022-04-12 VITALS — BP 141/81 | HR 91 | Temp 98.0°F | Ht 62.0 in | Wt 228.0 lb

## 2022-04-12 DIAGNOSIS — K8020 Calculus of gallbladder without cholecystitis without obstruction: Secondary | ICD-10-CM

## 2022-04-12 DIAGNOSIS — Z09 Encounter for follow-up examination after completed treatment for conditions other than malignant neoplasm: Secondary | ICD-10-CM

## 2022-04-12 DIAGNOSIS — K8000 Calculus of gallbladder with acute cholecystitis without obstruction: Secondary | ICD-10-CM

## 2022-04-12 NOTE — Progress Notes (Signed)
[facility] POST-OP OFFICE VISIT  04/12/2022  HPI: [name] is a 51 y.o. female 7 days s/p robotic assisted laparoscopic cholecystectomy for acute cholecystitis with Dr Hampton Abbot   She is overall doing well Minimal abdominal soreness at incisions No fever, chills, nausea, emesis Bowel movements are regular Incisions are healing well Drain with <1 ounce daily; serous   Vital signs: BP (!) 141/81   Pulse 91   Temp 98 F (36.7 C) (Oral)   Ht '5\' 2"'$  (1.575 m)   Wt 228 lb (103.4 kg)   LMP 12/17/2014   SpO2 96%   BMI 41.70 kg/m    Physical Exam: Constitutional: Well appearing female, NAD Abdomen: Soft, non-tender, non-distended, no rebound/guarding. Blake drain in right lateral port site; serous (Removed) Skin: Laparoscopic incisions are healing well, no erythema or drainage   Assessment/Plan: This is a 51 y.o. female 7 days s/p robotic assisted laparoscopic cholecystectomy for acute cholecystitis with Dr Hampton Abbot    - Drain removed; occlusive dressing placed  - Pain control prn  - Reviewed wound care recommendation  - Reviewed lifting restrictions; 4 weeks total  - Reviewed surgical pathology; Acute cholecystitis  - She can follow up on as needed basis; She understands to call with questions/concerns  -- Edison Simon, PA-C [facility] 04/12/2022, 3:01 PM M-F: 7am - 4pm

## 2022-04-12 NOTE — Patient Instructions (Addendum)

## 2022-04-19 ENCOUNTER — Encounter: Payer: Managed Care, Other (non HMO) | Admitting: Physician Assistant

## 2022-05-03 ENCOUNTER — Encounter: Payer: Managed Care, Other (non HMO) | Admitting: Physician Assistant
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
## 2022-05-05 ENCOUNTER — Encounter: Payer: Self-pay | Admitting: Physician Assistant

## 2022-05-05 ENCOUNTER — Ambulatory Visit (INDEPENDENT_AMBULATORY_CARE_PROVIDER_SITE_OTHER): Payer: Self-pay | Admitting: Physician Assistant

## 2022-05-05 ENCOUNTER — Other Ambulatory Visit: Payer: Self-pay

## 2022-05-05 VITALS — BP 131/82 | HR 76 | Temp 98.2°F | Ht 62.0 in | Wt 226.0 lb

## 2022-05-05 DIAGNOSIS — Z09 Encounter for follow-up examination after completed treatment for conditions other than malignant neoplasm: Secondary | ICD-10-CM

## 2022-05-05 DIAGNOSIS — K8000 Calculus of gallbladder with acute cholecystitis without obstruction: Secondary | ICD-10-CM

## 2022-05-05 DIAGNOSIS — K8020 Calculus of gallbladder without cholecystitis without obstruction: Secondary | ICD-10-CM

## 2022-05-05 NOTE — Progress Notes (Signed)
[facility] POST-OP OFFICE VISIT  05/05/2022  HPI: [name] is a 51 y.o. female 1 month s/p robotic assisted laparoscopic cholecystectomy for acute cholecystitis with Dr Hampton Abbot    She is doing better Main issue is diarrhea; she is not following any eating plans Abdominal soreness at one incision; mild; not needing pain medications No fever, chills, nausea, emesis Incisions ae healing well   Vital signs: BP 131/82   Pulse 76   Temp 98.2 F (36.8 C) (Oral)   Ht '5\' 2"'$  (1.575 m)   Wt 226 lb (102.5 kg)   LMP 12/17/2014   SpO2 97%   BMI 41.34 kg/m    Physical Exam: Constitutional: Well appearing female, NAD Abdomen: Soft, non-tender, non-distended, no rebound/guarding Skin: Laparoscopic incisions are healing well, no erythema or drainage   Assessment/Plan: This is a 51 y.o. female 1 month s/p robotic assisted laparoscopic cholecystectomy for acute cholecystitis with Dr Hampton Abbot     - Provided information on gallbladder eating plan; avoiding fatty/greasing foods and allowing body time to adjust  - Imodium prn for diarrhea  - Pain control prn  - Reviewed wound care recommendation  - Reviewed lifting restrictions; she has completed these   - She can follow up on as needed basis; She understands to call with questions/concerns  -- Edison Simon, PA-C [facility] 05/05/2022, 2:00 PM M-F: 7am - 4pm

## 2022-05-05 NOTE — Patient Instructions (Signed)

## 2022-05-06 ENCOUNTER — Telehealth: Payer: Self-pay | Admitting: Psychiatry

## 2022-07-29 ENCOUNTER — Telehealth: Payer: 59 | Admitting: Family

## 2022-07-29 ENCOUNTER — Encounter: Payer: Self-pay | Admitting: Family

## 2022-07-29 DIAGNOSIS — G2581 Restless legs syndrome: Secondary | ICD-10-CM

## 2022-07-29 DIAGNOSIS — F3113 Bipolar disorder, current episode manic without psychotic features, severe: Secondary | ICD-10-CM

## 2022-07-29 MED ORDER — PRAMIPEXOLE DIHYDROCHLORIDE 0.25 MG PO TABS: 0.2500 mg | ORAL_TABLET | Freq: Every evening | ORAL | 1 refills | Status: DC

## 2022-07-29 NOTE — Progress Notes (Signed)
Virtual Visit via Video Note  I connected with [name] on 08/01/22 at  4:00 PM EST by a video enabled telemedicine application and verified that I am speaking with the correct person using two identifiers. Location patient: home Location provider: work  Persons participating in the virtual visit: patient, provider  I discussed the limitations of evaluation and management by telemedicine and the availability of in person appointments. The patient expressed understanding and agreed to proceed.  HPI: Here today to discuss medications that she is recently changed insurance. She has new job and she is very happy.   She has not been consistent with Abilify and she 'refused to take the depakote.' The abilify IM is $1400/ per injection.   Denies symptoms of mania, SI/hi.   She is using Mirapex 0.125mg  as needed without relief.    She complains of restless legs which start most afternoons and go through the night.   She has tried gabapentin, requip without relief.    Ferritin 52 Hemoglobin 14    She was last seen by psychiatry 03/11/2022, Dr. Vanetta Shawl.  PSTD , bipolar disorder. Discussed concern for medication adherence. She was started on Abilify maintena 400mg  IM.  She was continued on Depakote.  She was hospitalized for mania 10/03/20.  She has had Sr Arfe[facility] in the past.    ROS: See pertinent positives and negatives per HPI.  EXAM:  VITALS per patient if applicable: LMP 12/17/2014  BP Readings from Last 3 Encounters:  05/05/22 131/82  04/12/22 (!) 141/81  04/05/22 117/66   Wt Readings from Last 3 Encounters:  05/05/22 226 lb (102.5 kg)  04/12/22 228 lb (103.4 kg)  04/05/22 223 lb (101.2 kg)    GENERAL: alert, oriented, appears well and in no acute distress  HEENT: atraumatic, conjunttiva clear, no obvious abnormalities on inspection of external nose and ears  NECK: normal movements of the head and neck  LUNGS: on inspection no signs of respiratory distress,  breathing rate appears normal, no obvious gross SOB, gasping or wheezing  CV: no obvious cyanosis  MS: moves all visible extremities without noticeable abnormality  PSYCH/NEURO: pleasant and cooperative, no obvious depression or anxiety, speech and thought processing grossly intact  ASSESSMENT AND PLAN: Restless leg syndrome Assessment & Plan: Ferrtin previously < 75. Advised to start ferrous sulfate 325mg  QD. Advised to start Mirapex 0.125mg  for one week. If no improvement, she will increase to mirapax 0.5mg .   Orders: -     Pramipexole Dihydrochloride; Take 1 tablet (0.25 mg total) by mouth every evening.  Dispense: 90 tablet; Refill: 1  Bipolar disorder, current episode manic without psychotic features, severe (HCC) Assessment & Plan: Appears stable at this time.  Expressed my concern in regards to patient history of mania and the importance of being on antipsychotic.  Strongly encouraged her to call Dr. Vanetta Shawl regards to resuming Abilify IM as she has new insurance and it may be covered.  She verbalized understanding and states she will make this phone call. Close follow up.       -we discussed possible serious and likely etiologies, options for evaluation and workup, limitations of telemedicine visit vs in person visit, treatment, treatment risks and precautions. Pt prefers to treat via telemedicine empirically rather then risking or undertaking an in person visit at this moment.    I discussed the assessment and treatment plan with the patient. The patient was provided an opportunity to ask questions and all were answered. The patient agreed with the plan  and demonstrated an understanding of the instructions.   The patient was advised to call back or seek an in-person evaluation if the symptoms worsen or if the condition fails to improve as anticipated.  Advised if desired AVS can be mailed or viewed via MyChart if Mychart user.   Rennie Plowman, FNP

## 2022-07-29 NOTE — Assessment & Plan Note (Addendum)
Strongly encouraged to follow up with psychiatry.

## 2022-07-29 NOTE — Assessment & Plan Note (Signed)
Ferrtin previously < 75. Advised to start ferrous sulfate '325mg'$  QD. Advised to start Mirapex 0.'125mg'$  for one week. If no improvement, she will increase to mirapax 0.'5mg'$ .

## 2022-08-01 NOTE — Patient Instructions (Addendum)
Please start ferrous sulfate 325 mg daily.  This over-the-counter iron   increase Mirapex to 0.'25mg'$  taken daily after you have consistently taken mirapex 0.'125mg'$  daily for one week.   As discussed, I would strongly urged you to call Dr. Modesta Messing to discuss restarting Abilify since  insurance has changed.  I want you to be safe and is very important that you follow up with psychiatry

## 2022-08-05 ENCOUNTER — Telehealth: Payer: Self-pay | Admitting: Family

## 2022-08-05 DIAGNOSIS — G2581 Restless legs syndrome: Secondary | ICD-10-CM

## 2022-08-05 MED ORDER — PRAMIPEXOLE DIHYDROCHLORIDE 0.5 MG PO TABS: 0.5000 mg | ORAL_TABLET | Freq: Every day | ORAL | 1 refills | Status: DC

## 2022-08-05 NOTE — Telephone Encounter (Signed)
Call patient I sent in Mirapex 0.5 mg.  Please ensure that she has been on Mirapex 0.25 mg for at least 5 to 7 days prior to increasing

## 2022-08-05 NOTE — Telephone Encounter (Signed)
Pt called in staying that she would like med pramipexole (MIRAPEX) 0.25 MG tablet to be increase. Also she wants to let Arnett know that she will see her psychologist on 4/25.

## 2022-08-08 NOTE — Telephone Encounter (Signed)
Called Patient she confirmed that she has been on Mirapex 0.25 for 5-9 days.

## 2022-08-12 ENCOUNTER — Encounter: Payer: 59 | Admitting: Family

## 2022-08-26 ENCOUNTER — Encounter: Payer: Self-pay | Admitting: Family

## 2022-08-26 ENCOUNTER — Ambulatory Visit: Payer: 59 | Admitting: Family

## 2022-08-26 VITALS — BP 128/78 | HR 84 | Temp 98.0°F | Ht 62.0 in | Wt 212.6 lb

## 2022-08-26 DIAGNOSIS — Z0000 Encounter for general adult medical examination without abnormal findings: Secondary | ICD-10-CM

## 2022-08-26 DIAGNOSIS — G2581 Restless legs syndrome: Secondary | ICD-10-CM

## 2022-08-26 DIAGNOSIS — Z122 Encounter for screening for malignant neoplasm of respiratory organs: Secondary | ICD-10-CM

## 2022-08-26 DIAGNOSIS — N6321 Unspecified lump in the left breast, upper outer quadrant: Secondary | ICD-10-CM | POA: Diagnosis not present

## 2022-08-26 DIAGNOSIS — Z23 Encounter for immunization: Secondary | ICD-10-CM

## 2022-08-26 DIAGNOSIS — Z136 Encounter for screening for cardiovascular disorders: Secondary | ICD-10-CM

## 2022-08-26 DIAGNOSIS — Z13220 Encounter for screening for lipoid disorders: Secondary | ICD-10-CM

## 2022-08-26 DIAGNOSIS — Z1231 Encounter for screening mammogram for malignant neoplasm of breast: Secondary | ICD-10-CM

## 2022-08-26 DIAGNOSIS — N6320 Unspecified lump in the left breast, unspecified quadrant: Secondary | ICD-10-CM | POA: Insufficient documentation

## 2022-08-26 DIAGNOSIS — R7303 Prediabetes: Secondary | ICD-10-CM

## 2022-08-26 DIAGNOSIS — F310 Bipolar disorder, current episode hypomanic: Secondary | ICD-10-CM

## 2022-08-26 MED ORDER — PRAMIPEXOLE DIHYDROCHLORIDE 0.75 MG PO TABS: 0.7500 mg | ORAL_TABLET | Freq: Every day | ORAL | 1 refills | Status: DC

## 2022-08-26 NOTE — Patient Instructions (Signed)
Please call  and schedule your 3D mammogram and /or bone density scan as we discussed.   [facility]  ( new location in 2023)  [address], [place], [place] [address]  [place], [place]    I have placed a referral to [facility] pulmonology for lung cancer screening program and their office will reach out to you to schedule your CT of your chest.  They will reach out to you annually going forward.  If you do not hear from their office in the next 1 to 2 weeks, please call [facility] at [place] [facility] for Postmenopausal Women Menopause is a normal process in which your ability to get pregnant comes to an end. This process happens slowly over many months or years, usually between the ages of 52 and 3. Menopause is complete when you have missed your menstrual period for 12 months. It is important to talk with your health care provider about some of the most common conditions that affect women after menopause (postmenopausal women). These include heart disease, cancer, and bone loss (osteoporosis). Adopting a healthy lifestyle and getting preventive care can help to promote your health and wellness. The actions you take can also lower your chances of developing some of these common conditions. What are the signs and symptoms of menopause? During menopause, you may have the following symptoms: Hot flashes. These can be moderate or severe. Night sweats. Decrease in sex drive. Mood swings. Headaches. Tiredness (fatigue). Irritability. Memory problems. Problems falling asleep or staying asleep. Talk with your health care provider about treatment options for your symptoms. Do I need hormone replacement therapy? Hormone replacement therapy is effective in treating symptoms that are caused by menopause, such as hot flashes and night sweats. Hormone replacement carries certain risks, especially as you become older. If  you are thinking about using estrogen or estrogen with progestin, discuss the benefits and risks with your health care provider. How can I reduce my risk for heart disease and stroke? The risk of heart disease, heart attack, and stroke increases as you age. One of the causes may be a change in the body's hormones during menopause. This can affect how your body uses dietary fats, triglycerides, and cholesterol. Heart attack and stroke are medical emergencies. There are many things that you can do to help prevent heart disease and stroke. Watch your blood pressure High blood pressure causes heart disease and increases the risk of stroke. This is more likely to develop in people who have high blood pressure readings or are overweight. Have your blood pressure checked: Every 3-5 years if you are 52-33 years of age. Every year if you are 52 years old or older. Eat a healthy diet  Eat a diet that includes plenty of vegetables, fruits, low-fat dairy products, and lean protein. Do not eat a lot of foods that are high in solid fats, added sugars, or sodium. Get regular exercise Get regular exercise. This is one of the most important things you can do for your health. Most adults should: Try to exercise for at least 150 minutes each week. The exercise should increase your heart rate and make you sweat (moderate-intensity exercise). Try to do strengthening exercises at least twice each week. Do these in addition to the moderate-intensity exercise. Spend less time sitting. Even light physical activity can be beneficial. Other tips Work with your health care provider to achieve or maintain a healthy weight. Do  not use any products that contain nicotine or tobacco. These products include cigarettes, chewing tobacco, and vaping devices, such as e-cigarettes. If you need help quitting, ask your health care provider. Know your numbers. Ask your health care provider to check your cholesterol and your blood sugar  (glucose). Continue to have your blood tested as directed by your health care provider. Do I need screening for cancer? Depending on your health history and family history, you may need to have cancer screenings at different stages of your life. This may include screening for: Breast cancer. Cervical cancer. Lung cancer. Colorectal cancer. What is my risk for osteoporosis? After menopause, you may be at increased risk for osteoporosis. Osteoporosis is a condition in which bone destruction happens more quickly than new bone creation. To help prevent osteoporosis or the bone fractures that can happen because of osteoporosis, you may take the following actions: If you are 52-84 years old, get at least 1,000 mg of calcium and at least 600 international units (IU) of vitamin D per day. If you are older than age 5 but younger than age 26, get at least 1,200 mg of calcium and at least 600 international units (IU) of vitamin D per day. If you are older than age 52, get at least 1,200 mg of calcium and at least 800 international units (IU) of vitamin D per day. Smoking and drinking excessive alcohol increase the risk of osteoporosis. Eat foods that are rich in calcium and vitamin D, and do weight-bearing exercises several times each week as directed by your health care provider. How does menopause affect my mental health? Depression may occur at any age, but it is more common as you become older. Common symptoms of depression include: Feeling depressed. Changes in sleep patterns. Changes in appetite or eating patterns. Feeling an overall lack of motivation or enjoyment of activities that you previously enjoyed. Frequent crying spells. Talk with your health care provider if you think that you are experiencing any of these symptoms. General instructions See your health care provider for regular wellness exams and vaccines. This may include: Scheduling regular health, dental, and eye exams. Getting and  maintaining your vaccines. These include: Influenza vaccine. Get this vaccine each year before the flu season begins. Pneumonia vaccine. Shingles vaccine. Tetanus, diphtheria, and pertussis (Tdap) booster vaccine. Your health care provider may also recommend other immunizations. Tell your health care provider if you have ever been abused or do not feel safe at home. Summary Menopause is a normal process in which your ability to get pregnant comes to an end. This condition causes hot flashes, night sweats, decreased interest in sex, mood swings, headaches, or lack of sleep. Treatment for this condition may include hormone replacement therapy. Take actions to keep yourself healthy, including exercising regularly, eating a healthy diet, watching your weight, and checking your blood pressure and blood sugar levels. Get screened for cancer and depression. Make sure that you are up to date with all your vaccines. This information is not intended to replace advice given to you by your health care provider. Make sure you discuss any questions you have with your health care provider. Document Revised: 10/12/2020 Document Reviewed: 10/12/2020 Elsevier Patient Education  [employer].

## 2022-08-26 NOTE — Progress Notes (Signed)
Assessment & Plan:  Routine physical examination Assessment & Plan: Clinical breast exam performed today.  Deferred pelvic exam in the absence of complaints and patient follows with GYN.  She is a candidate for CT lung cancer screening program and I have placed this referral   Encounter for screening mammogram for malignant neoplasm of breast  Mass of upper outer quadrant of left breast -     MM 3D DIAGNOSTIC MAMMOGRAM BILATERAL BREAST; Future -     [place] LIMITED ULTRASOUND INCLUDING AXILLA LEFT BREAST ; Future  Restless leg syndrome Assessment & Plan: Suboptimal control.  Increase Mirapex 0.75 mg nightly  Orders: -     Pramipexole Dihydrochloride; Take 1 tablet (0.75 mg total) by mouth at bedtime.  Dispense: 90 tablet; Refill: 1  Screening for lung cancer -     Ambulatory Referral for Lung Cancer Scre  Encounter for lipid screening for cardiovascular disease -     Comprehensive metabolic panel; Future -     Lipid panel; Future  Bipolar affective disorder, current episode hypomanic ([place]) -     CBC with Differential/Platelet; Future -     Comprehensive metabolic panel; Future  Prediabetes -     Hemoglobin A1c; Future  Need for pneumococcal 20-valent conjugate vaccination -     Pneumococcal conjugate vaccine 20-valent     Return precautions given.   Risks, benefits, and alternatives of the medications and treatment plan prescribed today were discussed, and patient expressed understanding.   Education regarding symptom management and diagnosis given to patient on AVS either electronically or printed.  No follow-ups on file.  Mable Paris, FNP  Subjective:    Patient ID: Mercer Pod, female    DOB: 1970/10/06, 52 y.o.   MRN: [id number]  CC: [name] is a 52 y.o. female who presents today for physical exam.    HPI: She feels well today.  RLS - she is taking mirapex 0.5mg  qpm. No numbness in legs, low back pain.      Following with Dr Modesta Messing and  follow up next week.   Colorectal Cancer Screening: UTD, 01/29/2019, Dr. Allen Norris Breast Cancer Screening: Mammogram due Cervical Cancer Screening: Due, she follows with GYN and will schedule an appointment Bone Health screening/DEXA for 65+: No increased fracture risk. Defer screening at this time.  Lung Cancer Screening: She meets criteria        Tetanus - utd        Pneumococcal - Candidate for PCV20.   Labs: Screening labs today. Exercise: No regular exercise.   Alcohol use:  none Smoking/tobacco use: smoker.    Health Maintenance  Topic Date Due   PAP SMEAR-Modifier  Never done   COVID-19 Vaccine (4 - 2023-24 season) 02/04/2022   INFLUENZA VACCINE  09/04/2022 (Originally 01/04/2022)   Zoster Vaccines- Shingrix (1 of 2) 11/26/2022 (Originally 08/01/2020)   MAMMOGRAM  01/28/2023   COLONOSCOPY (Pts 45-35yrs Insurance coverage will need to be confirmed)  01/28/2026   DTaP/Tdap/Td (2 - Td or Tdap) 03/27/2030   Hepatitis C Screening  Completed   HIV Screening  Completed   HPV VACCINES  Aged Out    ALLERGIES: Patient has no known allergies.  Current Outpatient Medications on File Prior to Visit  Medication Sig Dispense Refill   acetaminophen (TYLENOL) 500 MG tablet Take 2 tablets (1,000 mg total) by mouth every 6 (six) hours as needed for mild pain.     ibuprofen (ADVIL) 800 MG tablet Take 1 tablet (800 mg total) by  mouth every 8 (eight) hours as needed for moderate pain. 60 tablet 1   ARIPiprazole (ABILIFY) 20 MG tablet Take 1 tablet (20 mg total) by mouth daily. 90 tablet 0   No current facility-administered medications on file prior to visit.    Review of Systems  Constitutional:  Negative for chills, fever and unexpected weight change.  HENT:  Negative for congestion.   Respiratory:  Negative for cough.   Cardiovascular:  Negative for chest pain, palpitations and leg swelling.  Gastrointestinal:  Negative for nausea and vomiting.  Musculoskeletal:  Negative for arthralgias  and myalgias.  Skin:  Negative for rash.  Neurological:  Negative for numbness and headaches.  Hematological:  Negative for adenopathy.  Psychiatric/Behavioral:  Negative for confusion.       Objective:    BP 128/78   Pulse 84   Temp 98 F (36.7 C) (Oral)   Ht 5\' 2"  (1.575 m)   Wt 212 lb 9.6 oz (96.4 kg)   LMP 12/17/2014   SpO2 99%   BMI 38.89 kg/m   BP Readings from Last 3 Encounters:  08/26/22 128/78  05/05/22 131/82  04/12/22 (!) 141/81   Wt Readings from Last 3 Encounters:  08/26/22 212 lb 9.6 oz (96.4 kg)  05/05/22 226 lb (102.5 kg)  04/12/22 228 lb (103.4 kg)    Physical Exam Vitals reviewed.  Constitutional:      Appearance: Normal appearance. She is well-developed.  Eyes:     Conjunctiva/sclera: Conjunctivae normal.  Neck:     Thyroid: No thyroid mass or thyromegaly.  Cardiovascular:     Rate and Rhythm: Normal rate and regular rhythm.     Pulses: Normal pulses.     Heart sounds: Normal heart sounds.  Pulmonary:     Effort: Pulmonary effort is normal.     Breath sounds: Normal breath sounds. No wheezing, rhonchi or rales.  Chest:  Breasts:    Breasts are symmetrical.     Right: No inverted nipple, mass, nipple discharge, skin change or tenderness.     Left: No inverted nipple, mass, nipple discharge, skin change or tenderness.  Abdominal:     General: Bowel sounds are normal. There is no distension.     Palpations: Abdomen is soft. Abdomen is not rigid. There is no fluid wave or mass.     Tenderness: There is no abdominal tenderness. There is no guarding or rebound.  Lymphadenopathy:     Head:     Right side of head: No submental, submandibular, tonsillar, preauricular, posterior auricular or occipital adenopathy.     Left side of head: No submental, submandibular, tonsillar, preauricular, posterior auricular or occipital adenopathy.     Cervical: No cervical adenopathy.     Right cervical: No superficial, deep or posterior cervical adenopathy.     Left cervical: No superficial, deep or posterior cervical adenopathy.  Skin:    General: Skin is warm and dry.  Neurological:     Mental Status: She is alert.  Psychiatric:        Speech: Speech normal.        Behavior: Behavior normal.        Thought Content: Thought content normal.

## 2022-08-27 ENCOUNTER — Other Ambulatory Visit
Admission: RE | Admit: 2022-08-27 | Discharge: 2022-08-27 | Disposition: A | Discharge status: Home / Self Care | Payer: Managed Care, Other (non HMO) | Attending: Family | Admitting: Family

## 2022-08-27 DIAGNOSIS — R7303 Prediabetes: Secondary | ICD-10-CM

## 2022-08-27 DIAGNOSIS — F310 Bipolar disorder, current episode hypomanic: Secondary | ICD-10-CM

## 2022-08-27 DIAGNOSIS — Z13220 Encounter for screening for lipoid disorders: Secondary | ICD-10-CM | POA: Insufficient documentation

## 2022-08-27 DIAGNOSIS — Z136 Encounter for screening for cardiovascular disorders: Secondary | ICD-10-CM | POA: Diagnosis present

## 2022-08-27 LAB — CBC WITH DIFFERENTIAL/PLATELET
Abs Immature Granulocytes: 0.03 10*3/uL (ref 0.00–0.07)
Basophils Absolute: 0.1 10*3/uL (ref 0.0–0.1)
Basophils Relative: 0 %
Eosinophils Absolute: 0.2 10*3/uL (ref 0.0–0.5)
Eosinophils Relative: 1 %
HCT: 42.9 % (ref 36.0–46.0)
Hemoglobin: 14.3 g/dL (ref 12.0–15.0)
Immature Granulocytes: 0 %
Lymphocytes Relative: 20 %
Lymphs Abs: 2.8 10*3/uL (ref 0.7–4.0)
MCH: 29.5 pg (ref 26.0–34.0)
MCHC: 33.3 g/dL (ref 30.0–36.0)
MCV: 88.6 fL (ref 80.0–100.0)
Monocytes Absolute: 0.8 10*3/uL (ref 0.1–1.0)
Monocytes Relative: 6 %
Neutro Abs: 10.1 10*3/uL — ABNORMAL HIGH (ref 1.7–7.7)
Neutrophils Relative %: 73 %
Platelets: 327 10*3/uL (ref 150–400)
RBC: 4.84 MIL/uL (ref 3.87–5.11)
RDW: 13.5 % (ref 11.5–15.5)
WBC: 13.9 10*3/uL — ABNORMAL HIGH (ref 4.0–10.5)
nRBC: 0 % (ref 0.0–0.2)

## 2022-08-27 LAB — COMPREHENSIVE METABOLIC PANEL
ALT: 32 U/L (ref 0–44)
AST: 25 U/L (ref 15–41)
Albumin: 4.2 g/dL (ref 3.5–5.0)
Alkaline Phosphatase: 92 U/L (ref 38–126)
Anion gap: 10 (ref 5–15)
BUN: 16 mg/dL (ref 6–20)
CO2: 22 mmol/L (ref 22–32)
Calcium: 8.9 mg/dL (ref 8.9–10.3)
Chloride: 107 mmol/L (ref 98–111)
Creatinine, Ser: 0.75 mg/dL (ref 0.44–1.00)
GFR, Estimated: >60 mL/min (ref >60)
Glucose, Bld: 101 mg/dL — ABNORMAL HIGH (ref 70–99)
Potassium: 3.8 mmol/L (ref 3.5–5.1)
Sodium: 139 mmol/L (ref 135–145)
Total Bilirubin: 0.6 mg/dL (ref 0.3–1.2)
Total Protein: 7.7 g/dL (ref 6.5–8.1)

## 2022-08-27 LAB — LIPID PANEL
Cholesterol: 187 mg/dL (ref 0–200)
HDL: 48 mg/dL (ref >40)
LDL Cholesterol: 116 mg/dL — ABNORMAL HIGH (ref 0–99)
Total CHOL/HDL Ratio: 3.9 RATIO
Triglycerides: 113 mg/dL (ref <150)
VLDL: 23 mg/dL (ref 0–40)

## 2022-08-29 LAB — HEMOGLOBIN A1C
Hgb A1c MFr Bld: 5.9 % — ABNORMAL HIGH (ref 4.8–5.6)
Mean Plasma Glucose: 123 mg/dL

## 2022-08-30 ENCOUNTER — Other Ambulatory Visit: Payer: Self-pay | Admitting: Family

## 2022-08-30 DIAGNOSIS — R899 Unspecified abnormal finding in specimens from other organs, systems and tissues: Secondary | ICD-10-CM

## 2022-08-31 NOTE — Assessment & Plan Note (Addendum)
Clinical breast exam performed today.  Deferred pelvic exam in the absence of complaints and patient follows with GYN.  She is a candidate for CT lung cancer screening program and I have placed this referral

## 2022-08-31 NOTE — Assessment & Plan Note (Signed)
Suboptimal control.  Increase Mirapex 0.75 mg nightly
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
## 2022-09-09 ENCOUNTER — Encounter: Payer: Self-pay | Admitting: Psychiatry

## 2022-09-09 ENCOUNTER — Telehealth (INDEPENDENT_AMBULATORY_CARE_PROVIDER_SITE_OTHER): Payer: Self-pay | Admitting: Psychiatry

## 2022-09-09 ENCOUNTER — Other Ambulatory Visit: Payer: Managed Care, Other (non HMO)

## 2022-09-09 DIAGNOSIS — G2581 Restless legs syndrome: Secondary | ICD-10-CM

## 2022-09-09 DIAGNOSIS — E559 Vitamin D deficiency, unspecified: Secondary | ICD-10-CM

## 2022-09-09 DIAGNOSIS — F319 Bipolar disorder, unspecified: Secondary | ICD-10-CM

## 2022-09-09 DIAGNOSIS — F4310 Post-traumatic stress disorder, unspecified: Secondary | ICD-10-CM

## 2022-09-09 MED ORDER — ARIPIPRAZOLE 20 MG PO TABS: 20.0000 mg | ORAL_TABLET | Freq: Every day | ORAL | 0 refills | Status: DC

## 2022-09-09 NOTE — Patient Instructions (Signed)
Continue Abilify 20 mg daily  Obtain labs- ferritin, Vitamin D.  Next appointment 6/6 at 11 30

## 2022-09-16 ENCOUNTER — Ambulatory Visit
Admission: RE | Admit: 2022-09-16 | Discharge: 2022-09-16 | Disposition: A | Discharge status: Home / Self Care | Payer: Managed Care, Other (non HMO) | Source: Ambulatory Visit | Attending: Family | Admitting: Family

## 2022-09-16 DIAGNOSIS — N6321 Unspecified lump in the left breast, upper outer quadrant: Secondary | ICD-10-CM | POA: Diagnosis present

## 2022-10-10 ENCOUNTER — Encounter (HOSPITAL_COMMUNITY): Payer: Self-pay

## 2022-10-11 ENCOUNTER — Encounter: Payer: Self-pay | Admitting: Psychiatry

## 2022-10-11 ENCOUNTER — Ambulatory Visit (INDEPENDENT_AMBULATORY_CARE_PROVIDER_SITE_OTHER): Payer: 59 | Admitting: Psychiatry

## 2022-10-11 VITALS — BP 116/76 | HR 86 | Temp 97.7°F | Ht 62.0 in | Wt 210.4 lb

## 2022-10-11 DIAGNOSIS — E559 Vitamin D deficiency, unspecified: Secondary | ICD-10-CM | POA: Diagnosis not present

## 2022-10-11 DIAGNOSIS — G2581 Restless legs syndrome: Secondary | ICD-10-CM | POA: Diagnosis not present

## 2022-10-11 DIAGNOSIS — F3171 Bipolar disorder, in partial remission, most recent episode hypomanic: Secondary | ICD-10-CM

## 2022-10-11 DIAGNOSIS — F4310 Post-traumatic stress disorder, unspecified: Secondary | ICD-10-CM | POA: Diagnosis not present

## 2022-10-11 NOTE — Patient Instructions (Signed)
Continue Abilify 20 mg daily  Obtain labs- ferritin, Vitamin D.  Obtain EKG  Next appointment: 7/5 at 11 am
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
## 2022-10-19 ENCOUNTER — Encounter: Payer: Self-pay | Admitting: Family

## 2022-10-21 ENCOUNTER — Other Ambulatory Visit: Payer: Self-pay | Admitting: Family

## 2022-10-21 DIAGNOSIS — G2581 Restless legs syndrome: Secondary | ICD-10-CM

## 2022-11-10 ENCOUNTER — Ambulatory Visit: Payer: Self-pay | Admitting: Psychiatry

## 2022-11-16 ENCOUNTER — Other Ambulatory Visit: Payer: Self-pay | Admitting: Family

## 2022-11-16 DIAGNOSIS — G2581 Restless legs syndrome: Secondary | ICD-10-CM

## 2022-11-16 MED ORDER — PRAMIPEXOLE DIHYDROCHLORIDE 0.75 MG PO TABS: 0.7500 mg | ORAL_TABLET | Freq: Every day | ORAL | 1 refills | Status: DC

## 2022-11-21 ENCOUNTER — Telehealth: Payer: Self-pay | Admitting: Family

## 2022-11-21 DIAGNOSIS — G2581 Restless legs syndrome: Secondary | ICD-10-CM

## 2022-11-21 NOTE — Telephone Encounter (Signed)
Patient called and said her pharmacy did not receive the prescription for pramipexole (MIRAPEX) 0.75 MG tablet, please resend.

## 2022-11-22 MED ORDER — PRAMIPEXOLE DIHYDROCHLORIDE 0.75 MG PO TABS: 0.7500 mg | ORAL_TABLET | Freq: Every day | ORAL | 1 refills | Status: DC

## 2022-11-22 NOTE — Addendum Note (Signed)
Addended by: [name] on: 11/22/2022 10:49 AM   Modules accepted: Orders

## 2022-11-22 NOTE — Telephone Encounter (Signed)
Let pt know I have resent rx pramipexole

## 2022-11-26 NOTE — Progress Notes (Unsigned)
Patient of dr [name], Neurology at [facility].  Referral was not reviewed by Provider at [facility]   RESTLESS LEG SYNDROME - Worse. - Patient with history of RLS, starting during pregnancy 22 years ago. Patient states she has been unable to sleep in her bed. Patient feels her symptoms are disabling at times. Patient reports she has tried Gabapentin and Requip in the past without improvement. - Taking cogentin 1 mg at night, discontinue this. - Start taking Ferrous Sulfate 325 mg supplements once per day with dinner. - Start taking Lyrica 50 mg nightly for three nights, then increase to 100 mg nightly. - We will not start Mirapex or Requip at this time due interaction with to patients bipolar disorder medication. We will consider Gabapentin or Nortriptyline 10-20 mg in the future.  Follow-up with Dr. [name] in 2 weeks.  CHIEF COMPLAIN & HISTORY OF PRESENT ILLNESS: Ms. Batley is a 52 y.o. female presenting for evaluation of: Chief Complaint Patient presents with  Restless Legs  RESTLESS LEG SYNDROME Patient from 2018 for restless legs. States that her restless legs is "terrible" has not been able to sleep in her bed. Taking cogentin 1 mg at night. No side effects. This doesn't help. Psychiatrist advised her not to take clonazepam a lot. Taking Requip in the past and didn't help. Has not taken mirapex. Has tried gabapentin with no relief. Has these during the day as well. Patient her increased symptoms may be related to recently starting menopause. 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  DATA SUMMARY 10/18/2006 PROCEDURE:  CT  - CT HEAD WITHOUT CONTRAST  IMPRESSION: 1. No acute intracranial abnormality. Medications previously tried (reason stopped):  MEDICATIONS Outpatient Medications Marked as Taking for the 12/26/18 encounter (Telemedicine) with Alphonzo Lemmings, MD Medication Sig  ARIPiprazole (ABILIFY) 5 MG tablet Take 5 mg by mouth once daily.  clonazePAM  (KLONOPIN) 0.5 MG tablet 0.5 mg twice a day as needed for RLS  [DISCONTINUED] benztropine (COGENTIN) 1 MG tablet Take by mouth

## 2022-11-28 ENCOUNTER — Encounter: Payer: Self-pay | Admitting: Neurology

## 2022-11-28 ENCOUNTER — Ambulatory Visit: Payer: Managed Care, Other (non HMO) | Admitting: Neurology

## 2022-11-28 VITALS — BP 118/72 | HR 75 | Ht 62.0 in | Wt 210.0 lb

## 2022-11-28 DIAGNOSIS — G2581 Restless legs syndrome: Secondary | ICD-10-CM | POA: Diagnosis not present

## 2022-11-28 DIAGNOSIS — Z72820 Sleep deprivation: Secondary | ICD-10-CM

## 2022-11-28 DIAGNOSIS — F319 Bipolar disorder, unspecified: Secondary | ICD-10-CM | POA: Diagnosis not present

## 2022-11-28 MED ORDER — ROTIGOTINE 3 MG/24HR TD PT24: 3.0000 mg | MEDICATED_PATCH | Freq: Every day | TRANSDERMAL | 5 refills | Status: DC

## 2022-11-28 NOTE — Patient Instructions (Signed)
Plan) I want to see the patient in an in lab sleep study.     It is remarkable that sometimes caffeine helps in this paradoxical reaction can sometimes be seen in patients with a primary myoclonic movement disorder.  Sleep helps her restless legs.  Sleep helps her mood.  Clonazepam as initiated under Dr. [name] was not effective she recalls.  I like to change he to a nicotine patch or lozenges. Stop smoking !!  I allow for 2 caffeine beverages a day.   Rotigine patch 3 mg daily, this will replace mirapex after one day of overlap.    I plan to follow up either personally or through our NP within 3-5 months.

## 2022-12-01 ENCOUNTER — Institutional Professional Consult (permissible substitution): Payer: Managed Care, Other (non HMO) | Admitting: Neurology

## 2022-12-04 ENCOUNTER — Encounter: Payer: Self-pay | Admitting: Neurology
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
## 2022-12-05 ENCOUNTER — Ambulatory Visit: Payer: Self-pay | Admitting: Psychiatry

## 2022-12-05 ENCOUNTER — Other Ambulatory Visit: Payer: Self-pay | Admitting: Neurology

## 2022-12-05 MED ORDER — ROTIGOTINE 6 MG/24HR TD PT24: 1.0000 | MEDICATED_PATCH | Freq: Every day | TRANSDERMAL | 12 refills | Status: DC

## 2022-12-09 ENCOUNTER — Telehealth (INDEPENDENT_AMBULATORY_CARE_PROVIDER_SITE_OTHER): Payer: 59 | Admitting: Psychiatry

## 2022-12-09 ENCOUNTER — Encounter: Payer: Self-pay | Admitting: Psychiatry

## 2022-12-09 DIAGNOSIS — E559 Vitamin D deficiency, unspecified: Secondary | ICD-10-CM

## 2022-12-09 DIAGNOSIS — F4310 Post-traumatic stress disorder, unspecified: Secondary | ICD-10-CM | POA: Diagnosis not present

## 2022-12-09 DIAGNOSIS — G2581 Restless legs syndrome: Secondary | ICD-10-CM

## 2022-12-09 DIAGNOSIS — F3171 Bipolar disorder, in partial remission, most recent episode hypomanic: Secondary | ICD-10-CM

## 2022-12-09 MED ORDER — ARIPIPRAZOLE 20 MG PO TABS: 20.0000 mg | ORAL_TABLET | Freq: Every day | ORAL | 0 refills | Status: DC

## 2022-12-14 ENCOUNTER — Other Ambulatory Visit: Payer: Self-pay | Admitting: Neurology

## 2022-12-14 MED ORDER — ROTIGOTINE 8 MG/24HR TD PT24: 8.0000 mg | MEDICATED_PATCH | Freq: Every day | TRANSDERMAL | 5 refills | Status: DC

## 2022-12-14 NOTE — Addendum Note (Signed)
Addended by: Judi Cong on: 12/14/2022 09:26 AM   Modules accepted: Orders

## 2022-12-16 ENCOUNTER — Ambulatory Visit: Payer: Managed Care, Other (non HMO) | Attending: Family

## 2022-12-19 ENCOUNTER — Telehealth: Payer: Self-pay | Admitting: Neurology

## 2022-12-19 DIAGNOSIS — G2581 Restless legs syndrome: Secondary | ICD-10-CM

## 2022-12-19 NOTE — Telephone Encounter (Signed)
[employer] NPSG pending faxed notes.

## 2022-12-22 ENCOUNTER — Encounter: Payer: Self-pay | Admitting: Family

## 2022-12-23 ENCOUNTER — Other Ambulatory Visit
Admission: RE | Admit: 2022-12-23 | Discharge: 2022-12-23 | Disposition: A | Discharge status: Home / Self Care | Payer: Managed Care, Other (non HMO) | Source: Ambulatory Visit | Attending: Psychiatry | Admitting: Psychiatry

## 2022-12-23 ENCOUNTER — Ambulatory Visit: Payer: Managed Care, Other (non HMO)

## 2022-12-23 DIAGNOSIS — F319 Bipolar disorder, unspecified: Secondary | ICD-10-CM | POA: Diagnosis present

## 2022-12-23 DIAGNOSIS — R899 Unspecified abnormal finding in specimens from other organs, systems and tissues: Secondary | ICD-10-CM

## 2022-12-23 DIAGNOSIS — E559 Vitamin D deficiency, unspecified: Secondary | ICD-10-CM | POA: Diagnosis present

## 2022-12-23 DIAGNOSIS — G2581 Restless legs syndrome: Secondary | ICD-10-CM | POA: Diagnosis not present

## 2022-12-23 LAB — BASIC METABOLIC PANEL
Anion gap: 7 (ref 5–15)
BUN: 18 mg/dL (ref 6–20)
CO2: 27 mmol/L (ref 22–32)
Calcium: 9.3 mg/dL (ref 8.9–10.3)
Chloride: 107 mmol/L (ref 98–111)
Creatinine, Ser: 0.72 mg/dL (ref 0.44–1.00)
GFR, Estimated: >60 mL/min (ref >60)
Glucose, Bld: 95 mg/dL (ref 70–99)
Potassium: 4.1 mmol/L (ref 3.5–5.1)
Sodium: 141 mmol/L (ref 135–145)

## 2022-12-23 LAB — CBC WITH DIFFERENTIAL/PLATELET
Abs Immature Granulocytes: 0.01 10*3/uL (ref 0.00–0.07)
Basophils Absolute: 0.1 10*3/uL (ref 0.0–0.1)
Basophils Relative: 1 %
Eosinophils Absolute: 0.3 10*3/uL (ref 0.0–0.5)
Eosinophils Relative: 4 %
HCT: 40.1 % (ref 36.0–46.0)
Hemoglobin: 13.1 g/dL (ref 12.0–15.0)
Immature Granulocytes: 0 %
Lymphocytes Relative: 45 %
Lymphs Abs: 3.5 10*3/uL (ref 0.7–4.0)
MCH: 29.2 pg (ref 26.0–34.0)
MCHC: 32.7 g/dL (ref 30.0–36.0)
MCV: 89.3 fL (ref 80.0–100.0)
Monocytes Absolute: 0.6 10*3/uL (ref 0.1–1.0)
Monocytes Relative: 8 %
Neutro Abs: 3.3 10*3/uL (ref 1.7–7.7)
Neutrophils Relative %: 42 %
Platelets: 340 10*3/uL (ref 150–400)
RBC: 4.49 MIL/uL (ref 3.87–5.11)
RDW: 14.1 % (ref 11.5–15.5)
WBC: 7.7 10*3/uL (ref 4.0–10.5)
nRBC: 0 % (ref 0.0–0.2)

## 2022-12-23 LAB — VITAMIN D 25 HYDROXY (VIT D DEFICIENCY, FRACTURES): Vit D, 25-Hydroxy: 26.74 ng/mL — ABNORMAL LOW (ref 30–100)

## 2022-12-23 LAB — FERRITIN: Ferritin: 22 ng/mL (ref 11–307)

## 2022-12-25 NOTE — Progress Notes (Signed)
Please let her know that her vitamin D and ferritin levels are below the normal range. I recommend she follows up with her primary care physician for further guidance. Other labs are within normal range.

## 2022-12-26 NOTE — Progress Notes (Signed)
Called to inform patient of lab results she voiced understanding. 

## 2022-12-28 ENCOUNTER — Telehealth: Payer: Self-pay

## 2022-12-28 NOTE — Progress Notes (Signed)
Called patient several times to inform of lab results no answer left voicemail for patient to return call to office

## 2022-12-28 NOTE — Telephone Encounter (Signed)
pt states that she was already notified and that she already sent a message to dr. Jason Coop and she advised her to take iron and vit d. pt states that she would follow up with her later

## 2022-12-28 NOTE — Telephone Encounter (Signed)
-----   Message from Currie sent at 12/25/2022  2:14 PM EDT ----- Please let her know that her vitamin D and ferritin levels are below the normal range. I recommend she follows up with her primary care physician for further guidance. Other labs are within normal range.

## 2022-12-29 ENCOUNTER — Encounter: Payer: Self-pay | Admitting: Emergency Medicine

## 2023-01-01 ENCOUNTER — Other Ambulatory Visit: Payer: Self-pay | Admitting: Family

## 2023-01-01 DIAGNOSIS — F40243 Fear of flying: Secondary | ICD-10-CM

## 2023-01-01 MED ORDER — ALPRAZOLAM 0.5 MG PO TABS
0.2500 mg | ORAL_TABLET | Freq: Once | ORAL | 0 refills | Status: AC
Start: 2023-01-01 — End: 2023-01-01

## 2023-01-02 ENCOUNTER — Other Ambulatory Visit: Payer: Self-pay | Admitting: Family

## 2023-01-09 NOTE — Telephone Encounter (Signed)
Noted, thank you. HST- [employer] no auth req.

## 2023-01-09 NOTE — Telephone Encounter (Signed)
[employer] denied the NPSG please see below for the denial reason.   When you get a chance please order HST thank you.

## 2023-01-09 NOTE — Addendum Note (Signed)
Addended by: Judi Cong on: 01/09/2023 11:07 AM   Modules accepted: Orders

## 2023-01-19 ENCOUNTER — Telehealth: Payer: Self-pay

## 2023-01-19 NOTE — Telephone Encounter (Signed)
Received voicemail today from patient, patient was extremely upset and stated she had called multiple times to schedule her sleep study but had not heard back and needed a call back ASAP. I called the patient back right after lunch and apologized to the patient but let her know that we reached out on Wednesday 01/11/23 to schedule and did not hear back from her until today. Patient raised her voice and changed her tone stating we were wrong and she has left MULTIPLE messages on our main line. Patient also yelled that we closed early on Thursday for bad weather and emphasized that we are closed on Fridays. I again apologized to the patient and redirected the conversation to getting her sleep study scheduled. She expressed anger over insurance denying the in lab and her provider not doing a peer to peer. She stated she just wanted to get this done and not feel like this anymore. I tried to assure the patient a home sleep study worked better in her favor because the first in lab appointment we have is in November. Patient scheduled for HST on 01/31/23. After speaking with the patient a MyChart msg was forwarded to the sleep lab to look into the patient's concerns but patient has already been contacted and scheduled. Tagging Irving Burton & Meagan A. In the sleep lab as well so they are aware.

## 2023-01-19 NOTE — Telephone Encounter (Signed)
Spoke to pt and she has appt to go for the sleep study and she stated that she will make appt to see you  after she goes there

## 2023-01-23 NOTE — Telephone Encounter (Signed)
Pt has been scheduled for HST on 8/27 at 1:45p

## 2023-01-31 ENCOUNTER — Ambulatory Visit: Payer: Managed Care, Other (non HMO) | Admitting: Neurology

## 2023-01-31 DIAGNOSIS — G4733 Obstructive sleep apnea (adult) (pediatric): Secondary | ICD-10-CM

## 2023-01-31 DIAGNOSIS — G2581 Restless legs syndrome: Secondary | ICD-10-CM
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
## 2023-02-01 ENCOUNTER — Encounter: Payer: Self-pay | Admitting: Neurology

## 2023-02-02 DIAGNOSIS — G4733 Obstructive sleep apnea (adult) (pediatric): Secondary | ICD-10-CM | POA: Insufficient documentation

## 2023-02-02 NOTE — Procedures (Signed)
[facility] [name] 52 year old female 1970/11/13   HOME SLEEP TEST REPORT ( by Watch PAT)   STUDY DATA:  02-01-2023  PRIMARY NEUROLOGIST : Dr. [name] at [facility].  ORDERING CLINICIAN: [name], MD  REFERRING CLINICIAN: [name], FNP [facility] Dr Vanetta Shawl   CLINICAL INFORMATION/HISTORY: This 52 year -old female patient treated for anxiety and bipolar disorder byDr Hisada and presented  on 11-28-2022 with a history of RLS, reported severe sleep fragmentation. Here to see if other sleep disorders are also present. RLS was starting during pregnancy 22 years ago, at the time psychotic breakdown and dx of schizoaffective /bipolar disorder, Lithium and later Abilify.  The Patient states she has been unable to sleep in her bed and her symptoms are disabling at times.  Patient reports she has tried Gabapentin and Requip in the past without improvement. - Has been taking cogentin 1 mg at night, no changes in leg movements , will discontinue this. - Start taking Ferrous Sulfate 325 mg supplements once per day with dinner. - Start taking Lyrica 50 mg nightly for three nights, then increase to 100 mg nightly. - We will not start Mirapex or Requip at this time due interaction with to patients bipolar disorder medication. We will consider Gabapentin or Nortriptyline 10-20 mg in the future.      Epworth sleepiness score: 7/ 24 points   FSS endorsed at 49/ 63 points.    BMI: 38. 41 kg/m   Neck Circumference: 16.75    FINDINGS:   Sleep Summary:   Total Recording Time (hours, min): 7 hours 56 minutes      Total Sleep Time (hours, min): 6 hours 5 minutes               Percent REM (%):   15%  Sleep latency was 21 minutes and REM sleep latency 117 minutes long.  Wakefulness after sleep onset was 89 minutes.                                     Respiratory Indices by AASM criteria:   Calculated pAHI (per hour): 21.7 /h                            REM  pAHI: 37.2/h                                               NREM pAHI: 20/h                            Supine AHI:   While in supine position for 224 minutes the patient reached an AHI of 21.8/h in prone position 27.4/h left lateral sleep 50/h and right lateral sleep 15/h.  There was no significant positional benefit.  Snoring reached a mean volume of 45 dB which is loud and was present for 86% of total sleep time.  Oxygen Saturation Statistics:         O2 Saturation Range (%): Between the nadir at 90% of the maximal saturation of 98% with a mean saturation at 94%.                                     O2 Saturation (minutes) <89%: 0 minutes         Pulse Rate Statistics:   Pulse Mean (bpm):   74 bpm              Pulse Range:     Between 54 and 94 bpm            IMPRESSION:  This HST confirms the presence of moderately severe obstructive sleep apnea associated with loud snoring.  There was no significant sleep hypoxia noted and no bradycardia tachycardia.  There were no central sleep apnea seen. This sleep apnea was very much accentuated by REM sleep and not sleep position.   RECOMMENDATION: I recommend auto titrating CPAP to start with a setting between 5 and 15 cm water pressure and with 2 cm water EPR, heated humidifier,. If the patient can tolerate a full facemask we can try that first : I would be happy with using a mask that sits under the nose and covers her mouth such as a DreamWear or F30 I mask.  Revisit within 60 days of CPAP therapy.  Please note that this home sleep test was not meant to confirm or deny a diagnosis of restless legs or periodic limb movements as these factors cannot be evaluated by home sleep testing.  The goal of this home sleep test was to rule out another sleep disorder such as obstructive sleep apnea given her high risk by body mass index, neck size and the finding of ankle edema   INTERPRETING PHYSICIAN:    [name], MD

## 2023-02-02 NOTE — Addendum Note (Signed)
Addended by: [name] on: 02/02/2023 04:21 PM   Modules accepted: Orders

## 2023-02-03 ENCOUNTER — Telehealth: Payer: 59 | Admitting: Psychiatry

## 2023-02-07 ENCOUNTER — Encounter: Payer: Self-pay | Admitting: Neurology

## 2023-02-07 ENCOUNTER — Ambulatory Visit: Payer: Managed Care, Other (non HMO) | Admitting: Neurology

## 2023-02-07 VITALS — BP 125/80 | HR 75 | Ht 62.0 in | Wt 216.6 lb

## 2023-02-07 DIAGNOSIS — G4733 Obstructive sleep apnea (adult) (pediatric): Secondary | ICD-10-CM

## 2023-02-07 DIAGNOSIS — F3110 Bipolar disorder, current episode manic without psychotic features, unspecified: Secondary | ICD-10-CM

## 2023-02-07 DIAGNOSIS — G2581 Restless legs syndrome: Secondary | ICD-10-CM | POA: Diagnosis not present

## 2023-02-07 DIAGNOSIS — R7303 Prediabetes: Secondary | ICD-10-CM

## 2023-02-07 NOTE — Progress Notes (Addendum)
Provider:  [name], MD  Primary Care Physician:  [name], FNP [address] [place] [address]     PRIMARY NEUROLOGIST : Dr. [name] at [facility].  ORDERING CLINICIAN: [name], MD  REFERRING CLINICIAN: [name], FNP Cc Dr Vanetta Shawl       Chief Complaint according to patient                 HISTORY OF PRESENT ILLNESS:  [name] is a 52 y.o. female patient who is here for revisit 02/07/2023 for  starting CPAP ASAP and get better sleep- " I still jerk all night, in both legs". BMI: 38. 41 kg/m. Neck Circumference: 16.75 .  Chief concern according to patient :  This 52 year -old female patient treated for anxiety and bipolar disorder byDr Hisada and presented  on 11-28-2022 with a history of RLS, reported severe sleep fragmentation. Here to see if other sleep disorders are also present. RLS was starting during pregnancy 22 years ago, at the time psychotic breakdown and dx of schizoaffective /bipolar disorder, Lithium and later Abilify.  The Patient states she has been unable to sleep in her bed and her symptoms are disabling at times.  Patient reports she has tried Gabapentin and Requip in the past without improvement. - Has been taking cogentin 1 mg at night, no changes in leg movements , will discontinue this. - Start taking Ferrous Sulfate 325 mg supplements once per day with dinner. - Start taking Lyrica 50 mg nightly for three nights, then increase to 100 mg nightly. - We will not start Mirapex or Requip at this time due interaction with to patients bipolar disorder medication. We will consider Gabapentin or Nortriptyline 10-20 mg in the future.       Her HST ( we were unable to provide an in lab test in the time we needed to initiate sleep apnea treatment if positive) Calculated pAHI (per hour): 21.7 /h, REM pAHI: 37.2/h; NREM pAHI: 20/h. Supine AHI:   While in supine position for 224 minutes the patient reached an  AHI of 21.8/h in prone position 27.4/h left lateral sleep 50/h and right lateral sleep 15/h.  There was no significant positional benefit. Snoring reached a mean volume of 45 dB which is loud and was present for 86% of total sleep time.                                         I recommend auto titrating CPAP to start with a setting between 5 and 15 cm water pressure and with 2 cm water EPR, heated humidifier,. If the patient can tolerate a full facemask we can try that first : I would be happy with using a mask that sits under the nose and covers her mouth such as a DreamWear or F30 I mask.   Please note that this home sleep test was not meant to confirm or deny a diagnosis of restless legs or periodic limb movements as these factors cannot be evaluated by home sleep testing.  The goal of this home sleep test was to rule out another sleep disorder such as obstructive sleep apnea given her high risk by body mass index, neck size and the finding of ankle edema     Review of Systems: Out of a complete 14 system  review, the patient complains of only the following symptoms, and all other reviewed systems are negative.:  Fatigue, sleepiness , snoring,  RLS, sleep myoclonus.    How likely are you to doze in the following situations: 0 = not likely, 1 = slight chance, 2 = moderate chance, 3 = high chance   Sitting and Reading? Watching Television? Sitting inactive in a public place (theater or meeting)? As a passenger in a car for an hour without a break? Lying down in the afternoon when circumstances permit? Sitting and talking to someone? Sitting quietly after lunch without alcohol? In a car, while stopped for a few minutes in traffic?   Total = 7/ 24 points   FSS endorsed at 47/ 63 points.   Social History   Socioeconomic History   Marital status: Married    Spouse name: [name]   Number of children: 2   Years of education: 13   Highest education level: Some college, no degree   Occupational History   Not on file  Tobacco Use   Smoking status: Every Day    Current packs/day: 0.50    Average packs/day: 0.5 packs/day for 10.0 years (5.0 ttl pk-yrs)    Types: Cigarettes   Smokeless tobacco: Never   Tobacco comments:    Patient stated she is thinking about quitting and in contemplation..   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   Social Determinants of Health   Financial Resource Strain: Low Risk  (06/19/2017)   Overall Financial Resource Strain (CARDIA)    Difficulty of Paying Living Expenses: Not hard at all  Food Insecurity: No Food Insecurity (06/19/2017)   Hunger Vital Sign    Worried About Running Out of Food in the Last Year: Never true    Ran Out of Food in the Last Year: Never true  Transportation Needs: No Transportation Needs (06/19/2017)   PRAPARE - Administrator, Civil Service (Medical): No    Lack of Transportation (Non-Medical): No  Physical Activity: Inactive (06/19/2017)   Exercise Vital Sign    Days of Exercise per Week: 0 days    Minutes of Exercise per Session: 0 min  Stress: Stress Concern Present (06/19/2017)   [employer] of Occupational Health - Occupational Stress Questionnaire    Feeling of Stress : To some extent  Social Connections: Somewhat Isolated (06/19/2017)   Social Connection and Isolation Panel [NHANES]    Frequency of Communication with Friends and Family: More than three times a week    Frequency of Social Gatherings with Friends and Family: Once a week    Attends Religious Services: Never    Database administrator or Organizations: No    Attends Engineer, structural: Never    Marital Status: Married    Family History  Problem Relation Age of  Onset   Drug abuse Maternal Aunt    Suicidality Cousin     Past Medical History:  Diagnosis Date   Akathisia 12/17/2014   Bipolar affective (HCC)    Restless leg syndrome    Schizoaffective disorder (HCC)    Schizoaffective disorder (HCC)     Past Surgical  History:  Procedure Laterality Date   APPENDECTOMY     CESAREAN SECTION     COLONOSCOPY WITH PROPOFOL N/A 01/28/2021   Procedure: COLONOSCOPY WITH PROPOFOL;  Surgeon: Midge Minium, MD;  Location[facility] ENDOSCOPY;  Service: Endoscopy;  Laterality: N/A;   LAPAROSCOPIC APPENDECTOMY N/A 07/02/2020   Procedure: APPENDECTOMY LAPAROSCOPIC;  Surgeon: Duanne Guess, MD;  Location: [facility] ORS;  Service: General;  Laterality: N/A;     Current Outpatient Medications on File Prior to Visit  Medication Sig Dispense Refill   ARIPiprazole (ABILIFY) 20 MG tablet Take 1 tablet (20 mg total) by mouth daily. 90 tablet 0   ibuprofen (ADVIL) 600 MG tablet Take 600 mg by mouth every 8 (eight) hours as needed for mild pain.     Indomethacin 20 MG CAPS Take 25 mg by mouth daily.     MAGNESIUM CHLORIDE PO Take by mouth. Magnesium drops     Multiple Vitamins-Minerals (MULTIVITAMIN WITH MINERALS) tablet Take 1 tablet by mouth daily.     Rotigotine 8 MG/24HR PT24 Place 8 mg onto the skin daily. 30 patch 5   No current facility-administered medications on file prior to visit.    DIAGNOSTIC DATA (LABS, IMAGING, TESTING) - I reviewed patient records, labs, notes, testing and imaging myself where available.  Lab Results  Component Value Date   WBC 7.7 12/23/2022   HGB 13.1 12/23/2022   HCT 40.1 12/23/2022   MCV 89.3 12/23/2022   PLT 340 12/23/2022      Component Value Date/Time   NA 141 12/23/2022 1511   NA 140 08/22/2013 1343   K 4.1 12/23/2022 1511   K 3.9 08/22/2013 1343   CL 107 12/23/2022 1511   CL 108 (H) 08/22/2013 1343   CO2 27 12/23/2022 1511   CO2 27 08/22/2013 1343   GLUCOSE 95 12/23/2022 1511   GLUCOSE 117 (H) 08/22/2013 1343    BUN 18 12/23/2022 1511   BUN 10 08/22/2013 1343   CREATININE 0.72 12/23/2022 1511   CREATININE 0.77 08/22/2013 1343   CALCIUM 9.3 12/23/2022 1511   CALCIUM 9.0 08/22/2013 1343   PROT 7.7 08/27/2022 1219   PROT 7.7 08/22/2013 1343   ALBUMIN 4.2 08/27/2022 1219   ALBUMIN 3.9 08/22/2013 1343   AST 25 08/27/2022 1219   AST 26 08/22/2013 1343   ALT 32 08/27/2022 1219   ALT 30 08/22/2013 1343   ALKPHOS 92 08/27/2022 1219   ALKPHOS 81 08/22/2013 1343   BILITOT 0.6 08/27/2022 1219   BILITOT 0.3 08/22/2013 1343   GFRNONAA >60 12/23/2022 1511   GFRNONAA >60 08/22/2013 1343   GFRAA >60 12/17/2014 0447   GFRAA >60 08/22/2013 1343   Lab Results  Component Value Date   CHOL 187 08/27/2022   HDL 48 08/27/2022   LDLCALC 116 (H) 08/27/2022   TRIG 113 08/27/2022   CHOLHDL 3.9 08/27/2022   Lab Results  Component Value Date   HGBA1C 5.9 (H) 08/27/2022   Lab Results  Component Value Date   VITAMINB12 198 (L) 03/27/2020   Lab Results  Component Value Date   TSH 1.77 01/27/2021    PHYSICAL EXAM:  Today's Vitals   02/07/23 0832  BP: 125/80  Pulse: 75  Weight: 216 lb 9.6 oz (98.2 kg)  Height: 5\' 2"  (1.575 m)   Body mass index is 39.62 kg/m.   Wt Readings from Last 3 Encounters:  02/07/23 216 lb 9.6 oz (98.2 kg)  11/28/22 210 lb (95.3 kg)  08/26/22 212 lb 9.6 oz (96.4 kg)  Ht Readings from Last 3 Encounters:  02/07/23 5\' 2"  (1.575 m)  11/28/22 5\' 2"  (1.575 m)  08/26/22 5\' 2"  (1.575 m)      General:  The patient is awake, alert and appears not in acute distress. The patient is well groomed. Head: Normocephalic, atraumatic.  Neck is supple. Mallampati 2,  neck circumference:16.75 inches . Nasal airflow  patent.  Retrognathia is seen.  Dental status: biological, dry mouth.  Cardiovascular:  Regular rate and cardiac rhythm by pulse, without distended neck veins. Respiratory: Lungs are clear to auscultation.  Skin:  With evidence of mild ankle edema, or rash. Trunk:  The patient's posture is erect.   NEUROLOGIC EXAM: The patient is awake and alert, oriented to place and time.   Memory subjective described as intact.  Attention span & concentration ability appears normal.  Speech is fluent,  without dysarthria, dysphonia or aphasia.  Mood and affect are appropriate.   Cranial nerves: no loss of smell or taste reported  Pupils are equal and briskly reactive to light. Funduscopic exam deferred.  Extraocular movements in vertical and horizontal planes were intact and without nystagmus. No Diplopia. Visual fields by finger perimetry are intact. Hearing was intact to soft voice and finger rubbing.    Facial sensation intact to fine touch.  Facial motor strength is symmetric and tongue and uvula move midline. No TREMOR of tongue palate or jaw !  Neck ROM : rotation, tilt and flexion extension were normal for age and shoulder shrug was symmetrical.    Motor exam:  Symmetric bulk, tone and ROM.   Normal tone without cog wheeling, symmetric grip strength . She has rouble o extend the arms,keeps a minimal flexion at the elbow.    Sensory:  Fine touch tested as normal.  Proprioception tested in the upper extremities was normal.   Coordination: Rapid alternating movements in the fingers/hands were of normal speed.  The Finger-to-nose maneuver was intact without evidence of ataxia, dysmetria or tremor.   Gait and station: Patient could rise unassisted from a seated position, walked without assistive device.  Stance is of normal width/ base and the patient turned with 3 steps.  Toe and heel walk were deferred.  Deep tendon reflexes: in the  upper and lower extremities are symmetric and intact.  Attenuated due to failure of relaxation.  Babinski response was deferred.   ASSESSMENT AND PLAN 52 y.o. female  here with:    1) reportedly severe RLS. Was last treated by Dr [name] within the last 36 months. What is the role of [facility] - are we assuming  primary neuro care?  I will  recommend to her PCP to exchange Neurontin for Lyrica , it has stronger anti-myoclonic effect.   Risk factors for RLS : Strong family history.  Iron deficiency  confirmed, ferritin low at 22, should be around 50- please start and continue iron supplement in AM with a glass of OJ, low Vit D- 2000 units a day OTC, take also with food.    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  We follow up in 60-90 days after CPAP was initiated. Insurance rolls over in March 2025.   3) I would like to work with her on smoking cessation for overall health. If nicotine helps her to calm she should use nicotine patches.     I plan to follow up either personally or through our NP within 90 days of  therapy start.   I would like to thank Dr Vanetta Shawl for allowing me to meet with and to take care of this pleasant patient.   CC: I will share my notes with PCP.  After spending a total time of  35  minutes face to face and additional time for physical and neurologic examination, review of laboratory studies,  personal review of imaging studies, reports and results of other testing and review of referral information / records as far as provided in visit,   Electronically signed by: [name], MD 02/07/2023 9:23 AM  [facility] Board certified by The [employer] of Sleep Medicine and Diplomate of the [employer] of Sleep Medicine. Board certified In Neurology through the ABPN, Fellow of the [employer] of Neurology.

## 2023-02-07 NOTE — Patient Instructions (Addendum)
ASSESSMENT AND PLAN 52 y.o. female  here with:    1) reportedly severe RLS. Was last treated by Dr [name] within the last 36 months. What is the role of [facility] - are we assuming primary neuro care?  I will  recommend to her PCP to exchange Neurontin for Lyrica , it has stronger anti-myoclonic effect.   Risk factors for RLS : Strong family history.  Iron deficiency  confirmed, ferritin low at 22, should be around 50- please start and continue iron supplement in AM with a glass of OJ, low Vit D- 2000 units a day OTC, take also with food.    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  We follow up in 60-90 days after CPAP was initiated. Insurance rolls over in March 2025.   3) I would like to work with her on smoking cessation for overall health. If nicotine helps her to calm she should use nicotine patches.     I plan to follow up either personally or through our NP within 90 days of therapy start.   I would like to thank Dr Vanetta Shawl for allowing me to meet with and to take care of this pleasant patient.   CC: I will share my notes with PCP.  After spending a total time of  35  minutes face to face and additional time for physical and neurologic examination, review of laboratory studies,  personal review of imaging studies, reports and results of other testing and review of referral information / records as far as provided in visit,   Living With Sleep Apnea Sleep apnea is a condition in which breathing pauses or becomes shallow during sleep. Sleep apnea is most commonly caused by a collapsed or blocked airway. People with sleep apnea usually snore loudly. They may have times when they gasp and stop breathing for 10 seconds or more during sleep. This may happen many times during the night. The breaks in breathing also interrupt the deep sleep that you need to feel rested. Even if you do not completely wake up from the  gaps in breathing, your sleep may not be restful and you feel tired during the day. You may also have a headache in the morning and low energy during the day, and you may feel anxious or depressed. How can sleep apnea affect me? Sleep apnea increases your chances of extreme tiredness during the day (daytime fatigue). It can also increase your risk for health conditions, such as: Heart attack. Stroke. Obesity. Type 2 diabetes. Heart failure. Irregular heartbeat. High blood pressure. If you have daytime fatigue as a result of sleep apnea, you may be more likely to: Perform poorly at school or work. Fall asleep while driving. Have difficulty with attention. Develop depression or anxiety. Have sexual dysfunction. What actions can I take to manage sleep apnea? Sleep apnea treatment  If you were given a device to open your airway while you sleep, use it only as told by your health care provider. You may be given: An oral appliance. This is a custom-made mouthpiece that shifts your lower jaw forward. A continuous positive airway pressure (CPAP) device. This device blows air through a mask when you breathe out (exhale). A nasal expiratory positive airway pressure (EPAP) device. This device has valves that you put into each nostril. A bi-level positive airway pressure (BIPAP) device. This device blows air through a mask when you breathe in (inhale) and breathe  out (exhale). You may need surgery if other treatments do not work for you. Sleep habits Go to sleep and wake up at the same time every day. This helps set your internal clock (circadian rhythm) for sleeping. If you stay up later than usual, such as on weekends, try to get up in the morning within 2 hours of your normal wake time. Try to get at least 7-9 hours of sleep each night. Stop using a computer, tablet, and mobile phone a few hours before bedtime. Do not take long naps during the day. If you nap, limit it to 30 minutes. Have a  relaxing bedtime routine. Reading or listening to music may relax you and help you sleep. Use your bedroom only for sleep. Keep your television and computer out of your bedroom. Keep your bedroom cool, dark, and quiet. Use a supportive mattress and pillows. Follow your health care provider's instructions for other changes to sleep habits. Nutrition Do not eat heavy meals in the evening. Do not have caffeine in the later part of the day. The effects of caffeine can last for more than 5 hours. Follow your health care provider's or dietitian's instructions for any diet changes. Lifestyle     Do not drink alcohol before bedtime. Alcohol can cause you to fall asleep at first, but then it can cause you to wake up in the middle of the night and have trouble getting back to sleep. Do not use any products that contain nicotine or tobacco. These products include cigarettes, chewing tobacco, and vaping devices, such as e-cigarettes. If you need help quitting, ask your health care provider. Medicines Take over-the-counter and prescription medicines only as told by your health care provider. Do not use over-the-counter sleep medicine. You can become dependent on this medicine, and it can make sleep apnea worse. Do not use medicines, such as sedatives and narcotics, unless told by your health care provider. Activity Exercise on most days, but avoid exercising in the evening. Exercising near bedtime can interfere with sleeping. If possible, spend time outside every day. Natural light helps regulate your circadian rhythm. General information Lose weight if you need to, and maintain a healthy weight. Keep all follow-up visits. This is important. If you are having surgery, make sure to tell your health care provider that you have sleep apnea. You may need to bring your device with you. Where to find more information Learn more about sleep apnea and daytime fatigue from: American Sleep Association:  sleepassociation.org National Sleep Foundation: sleepfoundation.org National Heart, Lung, and Blood Institute: [employer] Summary Sleep apnea is a condition in which breathing pauses or becomes shallow during sleep. Sleep apnea can cause daytime fatigue and other serious health conditions. You may need to wear a device while sleeping to help keep your airway open. If you are having surgery, make sure to tell your health care provider that you have sleep apnea. You may need to bring your device with you. Making changes to sleep habits, diet, lifestyle, and activity can help you manage sleep apnea. This information is not intended to replace advice given to you by your health care provider. Make sure you discuss any questions you have with your health care provider. Document Revised: 12/30/2020 Document Reviewed: 05/01/2020 Elsevier Patient Education  2023 [employer].

## 2023-02-08 ENCOUNTER — Encounter: Payer: Self-pay | Admitting: Neurology

## 2023-02-08 ENCOUNTER — Telehealth: Payer: Self-pay | Admitting: Family

## 2023-02-08 MED ORDER — PREGABALIN 50 MG PO CAPS: ORAL_CAPSULE | ORAL | 0 refills | Status: DC

## 2023-02-08 NOTE — Telephone Encounter (Signed)
Pt called in stating that her neurologist its giving her a hard time giving her her Lyrica med. Pt stated, the appt she has with Arnett its on the 26th, and was wondering if Jason Coop would like to prescribed her med? Pt would like a call back

## 2023-02-10 NOTE — Telephone Encounter (Signed)
Noted See MyChart message from neurology.  Lyrica was started by neurology

## 2023-02-11 ENCOUNTER — Encounter: Payer: Self-pay | Admitting: Family

## 2023-02-13 ENCOUNTER — Other Ambulatory Visit: Payer: Self-pay | Admitting: Family

## 2023-02-13 DIAGNOSIS — G2581 Restless legs syndrome: Secondary | ICD-10-CM

## 2023-03-02 ENCOUNTER — Ambulatory Visit: Payer: Managed Care, Other (non HMO) | Admitting: Family

## 2023-03-02 ENCOUNTER — Encounter: Payer: Self-pay | Admitting: Family

## 2023-03-02 VITALS — BP 138/80 | HR 86 | Temp 97.9°F | Ht 62.0 in | Wt 220.0 lb

## 2023-03-02 DIAGNOSIS — Z8639 Personal history of other endocrine, nutritional and metabolic disease: Secondary | ICD-10-CM | POA: Diagnosis not present

## 2023-03-02 DIAGNOSIS — E88819 Insulin resistance, unspecified: Secondary | ICD-10-CM

## 2023-03-02 DIAGNOSIS — E559 Vitamin D deficiency, unspecified: Secondary | ICD-10-CM | POA: Diagnosis not present

## 2023-03-02 DIAGNOSIS — R7303 Prediabetes: Secondary | ICD-10-CM

## 2023-03-02 DIAGNOSIS — G2581 Restless legs syndrome: Secondary | ICD-10-CM

## 2023-03-02 DIAGNOSIS — L739 Follicular disorder, unspecified: Secondary | ICD-10-CM | POA: Insufficient documentation

## 2023-03-02 MED ORDER — MUPIROCIN 2 % EX OINT
1.0000 | TOPICAL_OINTMENT | Freq: Two times a day (BID) | CUTANEOUS | 1 refills | Status: AC
Start: 2023-03-02 — End: ?

## 2023-03-02 MED ORDER — METFORMIN HCL ER 500 MG PO TB24: 500.0000 mg | ORAL_TABLET | Freq: Every evening | ORAL | 2 refills | Status: DC

## 2023-03-02 MED ORDER — DOXYCYCLINE HYCLATE 100 MG PO TABS
100.0000 mg | ORAL_TABLET | Freq: Two times a day (BID) | ORAL | 0 refills | Status: AC
Start: 2023-03-02 — End: 2023-03-12

## 2023-03-02 NOTE — Progress Notes (Signed)
Assessment & Plan:  Prediabetes -     metFORMIN HCl ER; Take 1 tablet (500 mg total) by mouth every evening.  Dispense: 90 tablet; Refill: 2 -     Insulin, random; Future -     TSH; Future -     Hemoglobin A1c; Future  Restless leg syndrome Assessment & Plan: Chronic, improved on Lyrica 100mg  at bedtime as prescribed by neurology.  She continues to take ferrous sulfate 325 mg, advised via result note  MyChart  to increase to twice daily to further raise ferritin. Will follow.  Iron/TIBC/Ferritin/ %Sat    Component Value Date/Time   IRON 43 (L) 03/03/2023 1500   TIBC 388 03/03/2023 1500   FERRITIN 34 03/03/2023 1500   IRONPCTSAT 11 (L) 03/03/2023 1500     Orders: -     IBC + Ferritin -     Iron, TIBC and Ferritin Panel; Future  Vitamin D deficiency -     VITAMIN D 25 Hydroxy (Vit-D Deficiency, Fractures); Future  Folliculitis Assessment & Plan: Presentation consistent with folliculitis.  As more widespread, opted to start with doxycycline.  Provided Bactroban to use on smaller lesion in the future and advised Dial soap once per week in the upper thigh area, not in the vaginal area, to prevent recurrence  Orders: -     Doxycycline Hyclate; Take 1 tablet (100 mg total) by mouth 2 (two) times daily for 10 days.  Dispense: 20 tablet; Refill: 0 -     Mupirocin; Apply 1 Application topically 2 (two) times daily.  Dispense: 22 g; Refill: 1  History of vitamin D deficiency -     VITAMIN D 25 Hydroxy (Vit-D Deficiency, Fractures); Future  Insulin resistance Assessment & Plan: Lab Results  Component Value Date   HGBA1C 5.9 (H) 03/03/2023   Prediabetic, insulin resistance.  Counseled on low glycemic diet, calorie counting as well as side effects and titration of metformin.  Start metformin and titrate to maximum dose of 2000 mg daily      Return precautions given.   Risks, benefits, and alternatives of the medications and treatment plan prescribed today were discussed, and  patient expressed understanding.   Education regarding symptom management and diagnosis given to patient on AVS either electronically or printed.  Return in about 6 weeks (around 04/13/2023).  Rennie Plowman, FNP  Subjective:    Patient ID: Yolande Jolly, female    DOB: 02/28/1971, 52 y.o.   MRN: [id number]  CC: NICOLI ROAM is a 52 y.o. female who presents today for follow up.   HPI: Complains of multiple bumps upper blateral medial thighs.   She has h/o 'boil'; she tried topical ointment, thinks clindamycin with relief.   No purulent discharge, fever  H/o prediabetes  She has starting to walk 20 minutes most days of the week.  She is interested in weight loss, and weight loss medication  She is concerned with weight gain, particularly since she has started lyrica.  Lyrica has been very helpful for restless legs.           She is compliant with iron supplement   Ferritin 22 7//19/24  She is compliant with Lyrica 100mg  at bedtime as started by Dr Dohmeier   CPAP has been ordered  Seen by neurology 02/07/2023  Allergies: Patient has no known allergies. Current Outpatient Medications on File Prior to Visit  Medication Sig Dispense Refill   ARIPiprazole (ABILIFY) 20 MG tablet Take 1 tablet (20 mg total) by  mouth daily. 90 tablet 0   ibuprofen (ADVIL) 600 MG tablet Take 600 mg by mouth every 8 (eight) hours as needed for mild pain.     MAGNESIUM CHLORIDE PO Take by mouth. Magnesium drops     Multiple Vitamins-Minerals (MULTIVITAMIN WITH MINERALS) tablet Take 1 tablet by mouth daily.     pregabalin (LYRICA) 50 MG capsule Take 1 capsule (50 mg total) by mouth at bedtime for 3 days, THEN 2 capsules (100 mg total) at bedtime for 27 days. 57 capsule 0   No current facility-administered medications on file prior to visit.    Review of Systems  Constitutional:  Negative for chills and fever.  Respiratory:  Negative for cough.   Cardiovascular:  Negative for chest  pain and palpitations.  Gastrointestinal:  Negative for nausea and vomiting.  Skin:  Positive for rash.      Objective:    BP 138/80   Pulse 86   Temp 97.9 F (36.6 C) (Oral)   Ht 5\' 2"  (1.575 m)   Wt 220 lb (99.8 kg)   LMP 12/17/2014   SpO2 97%   BMI 40.24 kg/m  BP Readings from Last 3 Encounters:  03/02/23 138/80  02/07/23 125/80  11/28/22 118/72   Wt Readings from Last 3 Encounters:  03/02/23 220 lb (99.8 kg)  02/07/23 216 lb 9.6 oz (98.2 kg)  11/28/22 210 lb (95.3 kg)    Physical Exam Vitals reviewed.  Constitutional:      Appearance: She is well-developed.  Eyes:     Conjunctiva/sclera: Conjunctivae normal.  Cardiovascular:     Rate and Rhythm: Normal rate and regular rhythm.     Pulses: Normal pulses.     Heart sounds: Normal heart sounds.  Pulmonary:     Effort: Pulmonary effort is normal.     Breath sounds: Normal breath sounds. No wheezing, rhonchi or rales.  Skin:    General: Skin is warm and dry.          Comments: Several discrete erythematous papular lesions medial thighs as marked on diagram.  No purulent discharge, red streaks, increased heat.  Neurological:     Mental Status: She is alert.  Psychiatric:        Speech: Speech normal.        Behavior: Behavior normal.        Thought Content: Thought content normal.

## 2023-03-02 NOTE — Patient Instructions (Signed)
Metformin is used in prediabetes, diabetes, and also for weight loss by decreasing calorie consumption.   It works in a couple of ways by decreasing liver glucose production, decreases intestinal absorption of glucose and improves insulin sensitivity (increases peripheral glucose uptake and utilization).     Please make sure that you titrate per below so not to cause any GI upset.  The instructions on the metformin bottle however say metformin 1000mg  twice daily so that you do not run out of medication when you are on maximum dose but you will need to titrate to get there.    Lets trial metformin  Start metformin XR with one 500mg  tablet at night. After one week, you may increase to two tablets at night ( total of 1000mg ) . The third week, you may take take two tablets at night and one tablet in the morning.  The fourth week, you may take two tablets in the morning ( 1000mg  total) and two tablets at night (1000mg  total). This will bring you to a maximum daily dose of 2000mg /day which is maximum dose.  So you are aware,  you may take ALL 4 tablets of metformin together at the same time if preferable and doesn't cause GI upset. You may take metformin 2000mg  ( four of the 500mg  tablets) together in the morning or at night if you prefer.   Along the way, if you want to increase more slowly, please do as this medication can cause GI discomfort and loose stools which usually get better with time , however some patients find that they can only tolerate a certain dose and cannot increase to maximum dose.    Please download Myfitness Pal App ( basic version is free).   You may log every thing you eat for even 2-3 days to get a better of idea of total daily calories. To loose weight, we have to create caloric deficit to loose weight. The goal is 1-2 lbs per week of weight loss.   Excellent article below from [facility].    https://www.[email or web address].it  Calorie counting made easy  Eat less, exercise more. If only it were that simple! As most dieters know, losing weight can be very challenging. As this report details, a range of influences can affect how people gain and lose weight. But a basic understanding of how to tip your energy balance in favor of weight loss is a good place to start.  Start by determining how many calories you should consume each day. To do so, you need to know how many calories you need to maintain your current weight. Doing this requires a few simple calculations.  First, multiply your current weight by 15 -- that's roughly the number of calories per pound of body weight needed to maintain your current weight if you are moderately active. Moderately active means getting at least 30 minutes of physical activity a day in the form of exercise (walking at a brisk pace, climbing stairs, or active gardening). Let's say you're a woman who is 5 feet, 4 inches tall and weighs 155 pounds, and you need to lose about 15 pounds to put you in a healthy weight range. If you multiply 155 by 15, you will get 2,325, which is the number of calories per day that you need in order to maintain your current weight (weight-maintenance calories). To lose weight, you will need to get below that total.  For example, to lose 1 to 2 pounds a week -- a rate that experts  consider safe -- your food consumption should provide 500 to 1,000 calories less than your total weight-maintenance calories. If you need 2,325 calories a day to maintain your current weight, reduce your daily calories to between 1,325 and 1,825. If you are sedentary, you will also need to build more activity into your day. In order to lose at least a pound a week, try to do at least 30 minutes of physical activity on most days, and reduce your daily calorie intake by at least 500 calories. However, calorie intake should  not fall below 1,200 a day in women or 1,500 a day in men, except under the supervision of a health professional. Eating too few calories can endanger your health by depriving you of needed nutrients.  Meeting your calorie target How can you meet your daily calorie target? One approach is to add up the number of calories per serving of all the foods that you eat, and then plan your menus accordingly. You can buy books that list calories per serving for many foods. In addition, the nutrition labels on all packaged foods and beverages provide calories per serving information. Make a point of reading the labels of the foods and drinks you use, noting the number of calories and the serving sizes. Many recipes published in cookbooks, newspapers, and magazines provide similar information.  If you hate counting calories, a different approach is to restrict how much and how often you eat, and to eat meals that are low in calories. Dietary guidelines issued by the American Heart Association stress common sense in choosing your foods rather than focusing strictly on numbers, such as total calories or calories from fat. Whichever method you choose, research shows that a regular eating schedule -- with meals and snacks planned for certain times each day -- makes for the most successful approach. The same applies after you have lost weight and want to keep it off. Sticking with an eating schedule increases your chance of maintaining your new weight.    This is  Dr. Melina Schools  ( an amazing physician in my office!)  example of a  "Low GI"  Diet:  It will allow you to lose 4 to 8  lbs  per month if you follow it carefully.  Your goal with exercise is a minimum of 30 minutes of aerobic exercise 5 days per week (Walking does not count once it becomes easy!)    All of the foods can be found at grocery stores and in bulk at [employer].  The Atkins protein bars and shakes are available in more varieties at [employer], [employer] and  [employer]'s Foods.     7 AM Breakfast:  Choose from the following:  Low carbohydrate Protein  Shakes (I recommend the  Premier Protein chocolate shakes,  EAS AdvantEdge "Carb Control" shakes  Or the Atkins shakes all are under 3 net carbs)     a scrambled egg/bacon/cheese burrito made with [employer]'s "carb balance" whole wheat tortilla  (about 10 net carbs )  Medical laboratory scientific officer (basically a quiche without the pastry crust) that is eaten cold and very convenient way to get your eggs.  8 carbs)  If you make your own protein shakes, avoid bananas and pineapple,  And use low carb greek yogurt or original /unsweetened almond or soy milk    Avoid cereal and bananas, oatmeal and cream of wheat and grits. They are loaded with carbohydrates!   10 AM: high protein snack:  Protein bar  by Atkins (the snack size, under 200 cal, usually < 6 net carbs).    A stick of cheese:  Around 1 carb,  100 cal     Dannon Light n Fit [place] Yogurt  (80 cal, 8 carbs)  Other so called "protein bars" and Greek yogurts tend to be loaded with carbohydrates.  Remember, in food advertising, the word "energy" is synonymous for " carbohydrate."  Lunch:   A Sandwich using the bread choices listed, Can use any  Eggs,  lunchmeat, grilled meat or canned tuna), avocado, regular mayo/mustard  and cheese.  A Salad using blue cheese, ranch,  Goddess or vinagrette,  Avoid taco shells, croutons or "confetti" and no "candied nuts" but regular nuts OK.   No pretzels, nabs  or chips.  Pickles and miniature sweet peppers are a good low carb alternative that provide a "crunch"  The bread is the only source of carbohydrate in a sandwich and  can be decreased by trying some of the attached alternatives to traditional loaf bread   Avoid "Low fat dressings, as well as Reyne Dumas and [employer] dressings They are loaded with sugar!   3 PM/ Mid day  Snack:  Consider  1 ounce of  almonds, walnuts, pistachios, pecans, peanuts,   Macadamia nuts or a nut medley.  Avoid "granola and granola bars "  Mixed nuts are ok in moderation as long as there are no raisins,  cranberries or dried fruit.   KIND bars are OK if you get the low glycemic index variety   Try the prosciutto/mozzarella cheese sticks by [employer]  In deli /backery section   High protein      6 PM  Dinner:     Meat/fowl/fish with a green salad, and either broccoli, cauliflower, green beans, spinach, brussel sprouts or  Lima beans. DO NOT BREAD THE PROTEIN!!      There is a low carb pasta by Dreamfield's that is acceptable and tastes great: only 5 digestible carbs/serving.( All grocery stores but [employer]Js carry it ) Several ready made meals are available low carb:   Try Michel Angelo's chicken piccata or chicken or eggplant parm over low carb pasta.(Lowes and BJs)   Clifton Custard Sanchez's "Carnitas" (pulled pork, no sauce,  0 carbs) or his beef pot roast to make a dinner burrito (at [employer]'s)  Pesto over low carb pasta (bj's sells a good quality pesto in the center refrigerated section of the deli   Try satueeing  Roosvelt Harps with mushroooms as a good side   [employer] makes a mashed cauliflower that tastes like mashed potatoes  Whole wheat pasta is still full of digestible carbs and  Not as low in glycemic index as Dreamfield's.   Brown rice is still rice,  So skip the rice and noodles if you eat [place] or [place] (or at least limit to 1/2 cup)  9 PM snack :   Breyer's "low carb" fudgsicle or  ice cream bar (Carb Smart line), or  Weight Watcher's ice cream bar , or another "no sugar added" ice cream;  a serving of fresh berries/cherries with whipped cream   Cheese or DANNON'S LlGHT N FIT GREEK YOGURT  8 ounces of [place] unsweetened almond/cococunut milk    Treat yourself to a parfait made with whipped cream blueberiies, walnuts and vanilla greek yogurt  Avoid bananas, pineapple, grapes  and watermelon on a regular basis because they are high in sugar.  THINK OF THEM AS  DESSERT  Remember that  snack Substitutions should be less than 10 NET carbs per serving and meals < 20 carbs. Remember to subtract fiber grams to get the "net carbs."

## 2023-03-03 ENCOUNTER — Other Ambulatory Visit (INDEPENDENT_AMBULATORY_CARE_PROVIDER_SITE_OTHER): Payer: Managed Care, Other (non HMO)

## 2023-03-03 DIAGNOSIS — R7303 Prediabetes: Secondary | ICD-10-CM | POA: Diagnosis not present

## 2023-03-03 DIAGNOSIS — Z8639 Personal history of other endocrine, nutritional and metabolic disease: Secondary | ICD-10-CM

## 2023-03-03 DIAGNOSIS — E559 Vitamin D deficiency, unspecified: Secondary | ICD-10-CM

## 2023-03-03 DIAGNOSIS — G2581 Restless legs syndrome: Secondary | ICD-10-CM

## 2023-03-04 LAB — IRON,TIBC AND FERRITIN PANEL
%SAT: 11 % — ABNORMAL LOW (ref 16–45)
Ferritin: 34 ng/mL (ref 16–232)
Iron: 43 ug/dL — ABNORMAL LOW (ref 45–160)
TIBC: 388 ug/dL (ref 250–450)

## 2023-03-04 LAB — INSULIN, RANDOM: Insulin: 148.9 u[IU]/mL — ABNORMAL HIGH

## 2023-03-04 LAB — HEMOGLOBIN A1C
Hgb A1c MFr Bld: 5.9 %{Hb} — ABNORMAL HIGH (ref <5.7)
Mean Plasma Glucose: 123 mg/dL
eAG (mmol/L): 6.8 mmol/L

## 2023-03-04 LAB — VITAMIN D 25 HYDROXY (VIT D DEFICIENCY, FRACTURES): Vit D, 25-Hydroxy: 30 ng/mL (ref 30–100)

## 2023-03-04 LAB — TSH: TSH: 2.81 m[IU]/L

## 2023-03-06 DIAGNOSIS — E88819 Insulin resistance, unspecified: Secondary | ICD-10-CM | POA: Insufficient documentation

## 2023-03-06 NOTE — Assessment & Plan Note (Signed)
Chronic, improved on Lyrica 100mg  at bedtime as prescribed by neurology.  She continues to take ferrous sulfate 325 mg, advised via result note  MyChart  to increase to twice daily to further raise ferritin. Will follow.  Iron/TIBC/Ferritin/ %Sat    Component Value Date/Time   IRON 43 (L) 03/03/2023 1500   TIBC 388 03/03/2023 1500   FERRITIN 34 03/03/2023 1500   IRONPCTSAT 11 (L) 03/03/2023 1500

## 2023-03-06 NOTE — Assessment & Plan Note (Addendum)
Presentation consistent with folliculitis.  As more widespread, opted to start with doxycycline.  Provided Bactroban to use on smaller lesion in the future and advised Dial soap once per week in the upper thigh area, not in the vaginal area, to prevent recurrence

## 2023-03-06 NOTE — Assessment & Plan Note (Signed)
Lab Results  Component Value Date   HGBA1C 5.9 (H) 03/03/2023   Prediabetic, insulin resistance.  Counseled on low glycemic diet, calorie counting as well as side effects and titration of metformin.  Start metformin and titrate to maximum dose of 2000 mg daily

## 2023-03-08 ENCOUNTER — Other Ambulatory Visit: Payer: Self-pay | Admitting: Family

## 2023-03-10 ENCOUNTER — Other Ambulatory Visit: Payer: Self-pay | Admitting: Family

## 2023-03-10 DIAGNOSIS — R7303 Prediabetes: Secondary | ICD-10-CM

## 2023-03-10 MED ORDER — METFORMIN HCL ER 500 MG PO TB24
1000.0000 mg | ORAL_TABLET | Freq: Every evening | ORAL | 2 refills | Status: DC
Start: 2023-03-10 — End: 2023-07-14

## 2023-03-31 NOTE — Progress Notes (Signed)
No show

## 2023-04-06 ENCOUNTER — Ambulatory Visit: Payer: Self-pay | Admitting: Psychiatry

## 2023-04-06 DIAGNOSIS — Z91199 Patient's noncompliance with other medical treatment and regimen due to unspecified reason: Secondary | ICD-10-CM

## 2023-04-13 ENCOUNTER — Ambulatory Visit: Payer: Managed Care, Other (non HMO) | Admitting: Family
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
## 2023-04-21 ENCOUNTER — Encounter: Payer: Self-pay | Admitting: Psychiatry

## 2023-04-21 ENCOUNTER — Telehealth (INDEPENDENT_AMBULATORY_CARE_PROVIDER_SITE_OTHER): Payer: Managed Care, Other (non HMO) | Admitting: Psychiatry

## 2023-04-21 ENCOUNTER — Telehealth: Payer: Self-pay

## 2023-04-21 DIAGNOSIS — F4310 Post-traumatic stress disorder, unspecified: Secondary | ICD-10-CM | POA: Diagnosis not present

## 2023-04-21 DIAGNOSIS — F3171 Bipolar disorder, in partial remission, most recent episode hypomanic: Secondary | ICD-10-CM

## 2023-04-21 DIAGNOSIS — F5081 Binge eating disorder, mild: Secondary | ICD-10-CM

## 2023-04-21 DIAGNOSIS — G2581 Restless legs syndrome: Secondary | ICD-10-CM

## 2023-04-21 MED ORDER — TOPIRAMATE 25 MG PO CPSP: 25.0000 mg | ORAL_CAPSULE | Freq: Every day | ORAL | 1 refills | Status: DC

## 2023-04-21 MED ORDER — ARIPIPRAZOLE 20 MG PO TABS: 20.0000 mg | ORAL_TABLET | Freq: Every day | ORAL | 0 refills | Status: DC

## 2023-04-21 MED ORDER — TOPIRAMATE 25 MG PO TABS: 25.0000 mg | ORAL_TABLET | Freq: Every day | ORAL | 1 refills | Status: DC

## 2023-04-21 NOTE — Telephone Encounter (Signed)
Ordered

## 2023-04-21 NOTE — Telephone Encounter (Signed)
Pharmacy states that they dont have the topiramate in capsule form that they have the tablets. please send another rx for tablets.

## 2023-04-21 NOTE — Patient Instructions (Signed)
Continue Abilify 20 mg daily  Start topiramate 25 mg at night  Obtain EKG  Next appointment: 1/13 at 4 pm

## 2023-04-21 NOTE — Telephone Encounter (Signed)
pt notified that rx was sent

## 2023-05-24 ENCOUNTER — Ambulatory Visit: Payer: Managed Care, Other (non HMO) | Admitting: Neurology

## 2023-06-19 ENCOUNTER — Other Ambulatory Visit: Payer: Self-pay | Admitting: Family

## 2023-06-19 ENCOUNTER — Ambulatory Visit: Payer: Managed Care, Other (non HMO) | Admitting: Dietician

## 2023-06-19 ENCOUNTER — Ambulatory Visit: Payer: Self-pay | Admitting: Psychiatry

## 2023-06-20 ENCOUNTER — Encounter: Payer: Self-pay | Admitting: Family

## 2023-07-14 ENCOUNTER — Ambulatory Visit (INDEPENDENT_AMBULATORY_CARE_PROVIDER_SITE_OTHER): Payer: Managed Care, Other (non HMO) | Admitting: Family

## 2023-07-14 ENCOUNTER — Encounter: Payer: Self-pay | Admitting: Family

## 2023-07-14 VITALS — BP 130/78 | HR 73 | Temp 98.0°F | Ht 62.5 in | Wt 220.4 lb

## 2023-07-14 DIAGNOSIS — E669 Obesity, unspecified: Secondary | ICD-10-CM

## 2023-07-14 DIAGNOSIS — G4733 Obstructive sleep apnea (adult) (pediatric): Secondary | ICD-10-CM

## 2023-07-14 DIAGNOSIS — G2581 Restless legs syndrome: Secondary | ICD-10-CM

## 2023-07-14 MED ORDER — PREGABALIN 50 MG PO CAPS: 50.0000 mg | ORAL_CAPSULE | Freq: Three times a day (TID) | ORAL | 2 refills | Status: DC

## 2023-07-14 NOTE — Patient Instructions (Addendum)
Consider Zepbound to treat sleep apnea.   Increase Lyrica to 50 mg 3 times daily.

## 2023-07-14 NOTE — Progress Notes (Signed)
 Assessment & Plan:  Restless leg syndrome, uncontrolled -     Pregabalin ; Take 1 capsule (50 mg total) by mouth 3 (three) times daily.  Dispense: 90 capsule; Refill: 2  OSA (obstructive sleep apnea) Assessment & Plan: Discussed lack of treating sleep apnea is likely exacerbating ability  to lose weight, RLS.  We discussed Zepbound recently being approved for treatment of OSA.  I recommended consideration if Topamax  is ineffective   Restless leg syndrome Assessment & Plan: Increase Lyrica  to 50 mg TID due to daytime symptoms.  Encouraged her to start ferrous sulfate to raise iron stores.  Close follow up.    Obesity, unspecified class, unspecified obesity type, unspecified whether serious comorbidity present Assessment & Plan: Encouraged trial of Topamax  to aid in weight loss.  Advised her not to take metformin  at the same time.  Consider Zepbound      Return precautions given.   Risks, benefits, and alternatives of the medications and treatment plan prescribed today were discussed, and patient expressed understanding.   Education regarding symptom management and diagnosis given to patient on AVS either electronically or printed.  Return in about 3 months (around 10/11/2023).  Rollene Northern, FNP  Subjective:    Patient ID: [name], female    DOB: September 17, 1970, 53 y.o.   MRN: [id number]  CC: [name] is a 53 y.o. female who presents today for follow up.   HPI: No new complaints.  She continues to bothered by restless legs.     Compliant with Lyrica  100 mg at bedtime. She is not taking ferrous sulfate 325 mg BID. She will breakthrough symptoms during the daytime.  She has not started metformin .  Dr. Hisada  prescribed Topamax  25mg  for weight loss which she is considering starting.   History of sleep apnea.  She is not wearing CPAP machine at this time HST confirmed moderately severe OSA 01/2023, Dr Chalice  Allergies: Patient has no known  allergies. Current Outpatient Medications on File Prior to Visit  Medication Sig Dispense Refill   ARIPiprazole  (ABILIFY ) 20 MG tablet Take 1 tablet (20 mg total) by mouth daily. 90 tablet 0   ibuprofen  (ADVIL ) 600 MG tablet Take 600 mg by mouth every 8 (eight) hours as needed for mild pain.     MAGNESIUM  CHLORIDE PO Take by mouth. Magnesium  drops     Multiple Vitamins-Minerals (MULTIVITAMIN WITH MINERALS) tablet Take 1 tablet by mouth daily.     mupirocin  ointment (BACTROBAN ) 2 % Apply 1 Application topically 2 (two) times daily. 22 g 1   topiramate  (TOPAMAX ) 25 MG tablet Take 1 tablet (25 mg total) by mouth at bedtime. 30 tablet 1   No current facility-administered medications on file prior to visit.    Review of Systems  Constitutional:  Negative for chills and fever.  Respiratory:  Negative for cough.   Cardiovascular:  Negative for chest pain and palpitations.  Gastrointestinal:  Negative for nausea and vomiting.      Objective:    BP 130/78   Pulse 73   Temp 98 F (36.7 C) (Oral)   Ht 5' 2.5 (1.588 m)   Wt 220 lb 6.4 oz (100 kg)   LMP 12/17/2014   SpO2 98%   BMI 39.67 kg/m  BP Readings from Last 3 Encounters:  07/14/23 130/78  03/02/23 138/80  02/07/23 125/80   Wt Readings from Last 3 Encounters:  07/14/23 220 lb 6.4 oz (100 kg)  03/02/23 220 lb (99.8 kg)  02/07/23 216  lb 9.6 oz (98.2 kg)    Physical Exam Vitals reviewed.  Constitutional:      Appearance: She is well-developed.  Eyes:     Conjunctiva/sclera: Conjunctivae normal.  Cardiovascular:     Rate and Rhythm: Normal rate and regular rhythm.     Pulses: Normal pulses.     Heart sounds: Normal heart sounds.  Pulmonary:     Effort: Pulmonary effort is normal.     Breath sounds: Normal breath sounds. No wheezing, rhonchi or rales.  Skin:    General: Skin is warm and dry.  Neurological:     Mental Status: She is alert.  Psychiatric:        Speech: Speech normal.        Behavior: Behavior normal.         Thought Content: Thought content normal.

## 2023-07-19 NOTE — Assessment & Plan Note (Signed)
Encouraged trial of Topamax to aid in weight loss.  Advised her not to take metformin at the same time.  Consider Zepbound

## 2023-07-19 NOTE — Assessment & Plan Note (Signed)
Discussed lack of treating sleep apnea is likely exacerbating ability  to lose weight, RLS.  We discussed Zepbound recently being approved for treatment of OSA.  I recommended consideration if Topamax is ineffective

## 2023-07-19 NOTE — Assessment & Plan Note (Addendum)
Increase Lyrica to 50 mg TID due to daytime symptoms.  Encouraged her to start ferrous sulfate to raise iron stores.  Close follow up.

## 2023-07-24 ENCOUNTER — Encounter: Payer: Self-pay | Admitting: Family

## 2023-07-24 ENCOUNTER — Ambulatory Visit
Admission: EM | Admit: 2023-07-24 | Discharge: 2023-07-24 | Disposition: A | Discharge status: Home / Self Care | Payer: Managed Care, Other (non HMO) | Attending: Emergency Medicine | Admitting: Emergency Medicine

## 2023-07-24 DIAGNOSIS — J101 Influenza due to other identified influenza virus with other respiratory manifestations: Secondary | ICD-10-CM

## 2023-07-24 LAB — POC COVID19/FLU A&B COMBO
Covid Antigen, POC: NEGATIVE
Influenza A Antigen, POC: POSITIVE — AB
Influenza B Antigen, POC: NEGATIVE

## 2023-07-24 MED ORDER — ALBUTEROL SULFATE HFA 108 (90 BASE) MCG/ACT IN AERS: 1.0000 | INHALATION_SPRAY | Freq: Four times a day (QID) | RESPIRATORY_TRACT | 0 refills | Status: AC | PRN

## 2023-07-24 NOTE — ED Triage Notes (Signed)
Patient to Urgent Care with complaints of fevers/ "deep" cough/ nasal congestion/ chills/ "itching".   Symptoms started Saturday.   Meds: Robitussin DM/ otc cold and flu medication.

## 2023-07-24 NOTE — ED Provider Notes (Signed)
Renaldo Fiddler    CSN: [id number] Arrival date & time: 07/24/23  5638      History   Chief Complaint Chief Complaint  Patient presents with   Fever    HPI [name] is a 53 y.o. female.  Patient presents with 3-day history of fever, chills, congestion, cough.  She has been treating her symptoms with Robitussin and NyQuil.  No shortness of breath, vomiting, diarrhea.  The history is provided by the patient and medical records.    Past Medical History:  Diagnosis Date   Akathisia 12/17/2014   Bipolar affective (HCC)    Restless leg syndrome    Schizoaffective disorder (HCC)    Schizoaffective disorder [facility])     Patient Active Problem List   Diagnosis Date Noted   Insulin resistance 03/06/2023   Folliculitis 03/02/2023   OSA (obstructive sleep apnea) 02/02/2023   Sleep deprivation 11/28/2022   Restless leg syndrome, uncontrolled 11/28/2022   Left breast mass 08/26/2022   Calculus of gallbladder with acute cholecystitis without obstruction    Screen for colon cancer    Polyp of sigmoid colon    Prediabetes 12/18/2020   Neck pain 12/18/2020   Bipolar affective disorder, current episode manic (HCC) 10/03/2020   Vaginitis 08/14/2020   Yeast vaginitis 08/14/2020   Steatosis of liver 07/20/2020   S/P laparoscopic appendectomy 07/07/2020   Abdominal pain 07/04/2020   Sepsis (HCC)    Pneumoperitoneum    Acute appendicitis 07/02/2020   Bipolar affective (HCC)    Routine physical examination 03/27/2020   Obesity, unspecified 07/28/2017   Restless leg syndrome 12/17/2014    Past Surgical History:  Procedure Laterality Date   APPENDECTOMY     CESAREAN SECTION     COLONOSCOPY WITH PROPOFOL N/A 01/28/2021   Procedure: COLONOSCOPY WITH PROPOFOL;  Surgeon: Midge Minium, MD;  Location: [facility] ENDOSCOPY;  Service: Endoscopy;  Laterality: N/A;   LAPAROSCOPIC APPENDECTOMY N/A 07/02/2020   Procedure: APPENDECTOMY LAPAROSCOPIC;  Surgeon: Duanne Guess, MD;   Location: [facility] ORS;  Service: General;  Laterality: N/A;    OB History   No obstetric history on file.      Home Medications    Prior to Admission medications   Medication Sig Start Date End Date Taking? Authorizing Provider  albuterol (VENTOLIN HFA) 108 (90 Base) MCG/ACT inhaler Inhale 1-2 puffs into the lungs every 6 (six) hours as needed. 07/24/23  Yes Mickie Bail, NP  ARIPiprazole (ABILIFY) 20 MG tablet Take 1 tablet (20 mg total) by mouth daily. 04/21/23 07/20/23  Neysa Hotter, MD  ibuprofen (ADVIL) 600 MG tablet Take 600 mg by mouth every 8 (eight) hours as needed for mild pain.    [provider]  MAGNESIUM CHLORIDE PO Take by mouth. Magnesium drops    [provider]  Multiple Vitamins-Minerals (MULTIVITAMIN WITH MINERALS) tablet Take 1 tablet by mouth daily.    [provider]  mupirocin ointment (BACTROBAN) 2 % Apply 1 Application topically 2 (two) times daily. 03/02/23   [name], FNP  pregabalin (LYRICA) 50 MG capsule Take 1 capsule (50 mg total) by mouth 3 (three) times daily. 07/14/23   [name], FNP  topiramate (TOPAMAX) 25 MG tablet Take 1 tablet (25 mg total) by mouth at bedtime. 04/21/23 06/20/23  Neysa Hotter, MD    Family History Family History  Problem Relation Age of Onset   Drug abuse Maternal Aunt    Suicidality Cousin     Social History Social History  Tobacco Use   Smoking status: Every Day    Current packs/day: 0.50    Average packs/day: 0.5 packs/day for 10.0 years (5.0 ttl pk-yrs)    Types: Cigarettes   Smokeless tobacco: Never   Tobacco comments:    Patient stated she is thinking about quitting and in contemplation..   Vaping Use   Vaping status: Former  Substance Use Topics   Alcohol use: No   Drug use: No     Allergies   Patient has no known allergies.   Review of Systems Review of Systems  Constitutional:  Positive for chills and fever.  HENT:  Positive for congestion. Negative for  ear pain and sore throat.   Respiratory:  Positive for cough. Negative for shortness of breath.   Gastrointestinal:  Negative for diarrhea and vomiting.     Physical Exam Triage Vital Signs ED Triage Vitals [07/24/23 0931]  Encounter Vitals Group     BP 132/77     Systolic BP Percentile      Diastolic BP Percentile      Pulse Rate 99     Resp 20     Temp (!) 100.5 F (38.1 C)     Temp src      SpO2 95 %     Weight      Height      Head Circumference      Peak Flow      Pain Score      Pain Loc      Pain Education      Exclude from Growth Chart    No data found.  Updated Vital Signs BP 132/77   Pulse 99   Temp (!) 100.5 F (38.1 C)   Resp 20   LMP 12/17/2014   SpO2 95%   Visual Acuity Right Eye Distance:   Left Eye Distance:   Bilateral Distance:    Right Eye Near:   Left Eye Near:    Bilateral Near:     Physical Exam Constitutional:      General: She is not in acute distress. HENT:     Right Ear: Tympanic membrane normal.     Left Ear: Tympanic membrane normal.     Nose: Rhinorrhea present.     Mouth/Throat:     Mouth: Mucous membranes are moist.     Pharynx: Oropharynx is clear.  Cardiovascular:     Rate and Rhythm: Normal rate and regular rhythm.     Heart sounds: Normal heart sounds.  Pulmonary:     Effort: Pulmonary effort is normal. No respiratory distress.     Breath sounds: Normal breath sounds.  Neurological:     Mental Status: She is alert.      UC Treatments / Results  Labs (all labs ordered are listed, but only abnormal results are displayed) Labs Reviewed  POC COVID19/FLU A&B COMBO - Abnormal; Notable for the following components:      Result Value   Influenza A Antigen, POC Positive (*)    All other components within normal limits    EKG   Radiology No results found.  Procedures Procedures (including critical care time)  Medications Ordered in UC Medications - No data to display  Initial Impression / Assessment  and Plan / UC Course  I have reviewed the triage vital signs and the nursing notes.  Pertinent labs & imaging results that were available during my care of the patient were reviewed by me and considered in my medical decision  making (see chart for details).    Influenza A.  Rapid flu test positive for influenza A.  Patient is outside the window for treatment.  Per patient request, albuterol inhaler sent to pharmacy.  Discussed symptomatic treatment including Tylenol or ibuprofen as needed, rest, hydration.  Education provided on influenza.  Instructed patient to follow-up with PCP if symptoms are not improving.  ED precautions given.  Patient agrees to plan of care.   Final Clinical Impressions(s) / UC Diagnoses   Final diagnoses:  Influenza A     Discharge Instructions      Your flu test is positive for influenza A.    Use the albuterol inhaler as directed.  Take Tylenol or ibuprofen as needed for fever or discomfort.    Follow-up with your primary care provider if your symptoms are not improving.      ED Prescriptions     Medication Sig Dispense Auth. Provider   albuterol (VENTOLIN HFA) 108 (90 Base) MCG/ACT inhaler Inhale 1-2 puffs into the lungs every 6 (six) hours as needed. 18 g Mickie Bail, NP      PDMP not reviewed this encounter.   Mickie Bail, NP 07/24/23 [phone]

## 2023-07-24 NOTE — Discharge Instructions (Addendum)
Your flu test is positive for influenza A.    Use the albuterol inhaler as directed.  Take Tylenol or ibuprofen as needed for fever or discomfort.    Follow-up with your primary care provider if your symptoms are not improving.

## 2023-07-25 ENCOUNTER — Telehealth (INDEPENDENT_AMBULATORY_CARE_PROVIDER_SITE_OTHER): Payer: Managed Care, Other (non HMO) | Admitting: Psychiatry

## 2023-07-25 ENCOUNTER — Encounter: Payer: Self-pay | Admitting: Psychiatry

## 2023-07-25 DIAGNOSIS — F5081 Binge eating disorder, mild: Secondary | ICD-10-CM | POA: Diagnosis not present

## 2023-07-25 DIAGNOSIS — G2581 Restless legs syndrome: Secondary | ICD-10-CM | POA: Diagnosis not present

## 2023-07-25 DIAGNOSIS — F3171 Bipolar disorder, in partial remission, most recent episode hypomanic: Secondary | ICD-10-CM

## 2023-07-25 DIAGNOSIS — Z79899 Other long term (current) drug therapy: Secondary | ICD-10-CM

## 2023-07-25 DIAGNOSIS — F4310 Post-traumatic stress disorder, unspecified: Secondary | ICD-10-CM

## 2023-07-25 MED ORDER — ARIPIPRAZOLE 20 MG PO TABS: 20.0000 mg | ORAL_TABLET | Freq: Every day | ORAL | 0 refills | Status: DC

## 2023-07-25 MED ORDER — TOPIRAMATE 50 MG PO TABS: 50.0000 mg | ORAL_TABLET | Freq: Every day | ORAL | 0 refills | Status: DC

## 2023-07-25 NOTE — Patient Instructions (Signed)
Continue Abilify 20 mg daily  Increase topiramate 50 mg at night  Obtain EKG  Obtain lab (ferritin)- [facility] Next appointment: 5/1 at 4:30
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
## 2023-08-01 ENCOUNTER — Ambulatory Visit: Payer: Self-pay | Admitting: Psychiatry

## 2023-08-25 ENCOUNTER — Telehealth: Payer: Self-pay | Admitting: Psychiatry

## 2023-08-25 ENCOUNTER — Ambulatory Visit: Payer: Self-pay | Admitting: *Deleted

## 2023-08-25 NOTE — Telephone Encounter (Signed)
 I would recommend scheduling an earlier appointment given her new symptoms, so we can discuss them in more detail. In the meantime, if she's interested, I can place an order for hydroxyzine 25 mg to be taken as needed for anxiety. Please let me know if she would like to proceed with this.

## 2023-08-25 NOTE — Telephone Encounter (Signed)
  Chief Complaint: increased anxiety- requesting medication  Symptoms: patient states she is short with husband, has increased heart rate at times, work stress Frequency: ongoing - patient does have therapist and has also reached out  Pertinent Negatives: Patient denies suicidal thought/plan Disposition: [] ED /[] Urgent Care (no appt availability in office) / [x] Appointment(In office/virtual)/ []  [facility]/ [] Home Care/ [] Refused Recommended Disposition /[]  Mobile Bus/ []  Follow-up with PCP Additional Notes: Patient states she normally just "deals" with her anxiety- but has decided to request additional medication. She states she has also reached out to Dr Vanetta Shawl also regarding medication request. Appointment has been scheduled .    Copied from CRM [id number]. Topic: Clinical - Red Word Triage >> Aug 25, 2023 11:48 AM [place] A wrote: [facility] that prompted transfer to Nurse Triage: Patient is experiencing anxiety for the past two Reason for Disposition . [1] Symptoms of anxiety or panic attack AND [2] is a chronic symptom (recurrent or ongoing AND present > 4 weeks)  Answer Assessment - Initial Assessment Questions 1. CONCERN: "Did anything happen that prompted you to call today?"      Anxiety- heart races, patient put in notice at work, increased stress 2. ANXIETY SYMPTOMS: "Can you describe how you (your loved one; patient) have been feeling?" (e.g., tense, restless, panicky, anxious, keyed up, overwhelmed, sense of impending doom).      Heart racing 3. ONSET: "How long have you been feeling this way?" (e.g., hours, days, weeks)     Patient sees psychiatrist  4. SEVERITY: "How would you rate the level of anxiety?" (e.g., 0 - 10; or mild, moderate, severe).     mild 5. FUNCTIONAL IMPAIRMENT: "How have these feelings affected your ability to do daily activities?" "Have you had more difficulty than usual doing your normal daily activities?" (e.g., getting better, same,  worse; self-care, school, work, interactions)     Patient is able to function 6. HISTORY: "Have you felt this way before?" "Have you ever been diagnosed with an anxiety problem in the past?" (e.g., generalized anxiety disorder, panic attacks, PTSD). If Yes, ask: "How was this problem treated?" (e.g., medicines, counseling, etc.)     Yes- normally patient deals with it- but she has decided to request something 7. RISK OF HARM - SUICIDAL IDEATION: "Do you ever have thoughts of hurting or killing yourself?" If Yes, ask:  "Do you have these feelings now?" "Do you have a plan on how you would do this?"     No thought or plans 8. TREATMENT:  "What has been done so far to treat this anxiety?" (e.g., medicines, relaxation strategies). "What has helped?"     Patient is currently taking prescribed medications- but feels she needs more 9. TREATMENT - THERAPIST: "Do you have a counselor or therapist? Name?"     Patient does have therapist- Dr Vanetta Shawl   11. PATIENT SUPPORT: "Who is with you now?" "Who do you live with?" "Do you have family or friends who you can talk to?"        Husband is support 43. OTHER SYMPTOMS: "Do you have any other symptoms?" (e.g., feeling depressed, trouble concentrating, trouble sleeping, trouble breathing, palpitations or fast heartbeat, chest pain, sweating, nausea, or diarrhea)       Fast heart beat at times.  Protocols used: Anxiety and Panic Attack-A-AH

## 2023-08-28 ENCOUNTER — Other Ambulatory Visit: Payer: Self-pay | Admitting: Psychiatry

## 2023-08-28 MED ORDER — HYDROXYZINE HCL 25 MG PO TABS: 25.0000 mg | ORAL_TABLET | Freq: Every day | ORAL | 0 refills | Status: AC | PRN

## 2023-08-28 NOTE — Telephone Encounter (Signed)
 Called patient as instructed by provider patient stated that her symptoms did improve over the weekend and she would like for the provider to send in the Hydroxyzine 25 mg to be taken as needed for anxiety pharmacy verified

## 2023-08-28 NOTE — Telephone Encounter (Signed)
 Ordered

## 2023-08-28 NOTE — Telephone Encounter (Signed)
 Spoke to pt scheduled her for my chart vv for 08/29/23

## 2023-08-29 ENCOUNTER — Encounter: Payer: Self-pay | Admitting: Psychiatry

## 2023-08-29 ENCOUNTER — Telehealth (INDEPENDENT_AMBULATORY_CARE_PROVIDER_SITE_OTHER): Admitting: Family

## 2023-08-29 ENCOUNTER — Encounter: Payer: Self-pay | Admitting: Family

## 2023-08-29 ENCOUNTER — Ambulatory Visit: Admitting: Family

## 2023-08-29 ENCOUNTER — Telehealth (INDEPENDENT_AMBULATORY_CARE_PROVIDER_SITE_OTHER): Admitting: Psychiatry

## 2023-08-29 VITALS — Ht 62.0 in | Wt 220.1 lb

## 2023-08-29 DIAGNOSIS — F411 Generalized anxiety disorder: Secondary | ICD-10-CM

## 2023-08-29 DIAGNOSIS — F3171 Bipolar disorder, in partial remission, most recent episode hypomanic: Secondary | ICD-10-CM | POA: Diagnosis not present

## 2023-08-29 DIAGNOSIS — Z79899 Other long term (current) drug therapy: Secondary | ICD-10-CM

## 2023-08-29 DIAGNOSIS — F418 Other specified anxiety disorders: Secondary | ICD-10-CM | POA: Insufficient documentation

## 2023-08-29 DIAGNOSIS — G2581 Restless legs syndrome: Secondary | ICD-10-CM

## 2023-08-29 DIAGNOSIS — F4310 Post-traumatic stress disorder, unspecified: Secondary | ICD-10-CM | POA: Diagnosis not present

## 2023-08-29 DIAGNOSIS — F5081 Binge eating disorder, mild: Secondary | ICD-10-CM

## 2023-08-29 NOTE — Patient Instructions (Signed)
 Continue Abilify 20 mg daily  Increase topiramate 50 mg at night  Obtain EKG  Obtain lab (ferritin)- [facility] Next appointment: 5/1 at 4:30

## 2023-08-29 NOTE — Patient Instructions (Addendum)
 Start atarax 12.5 mg twice daily during the day, and then take atarax 25mg  at bedtime.    Thinking of you.
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
## 2023-08-29 NOTE — Progress Notes (Signed)
 Virtual Visit via Video Note  I connected with [name] on 08/29/23 at  8:15 AM EDT by a video enabled telemedicine application and verified that I am speaking with the correct person using two identifiers. Location patient: home Location provider: work  Persons participating in the virtual visit: patient, provider  I discussed the limitations of evaluation and management by telemedicine and the availability of in person appointments. The patient expressed understanding and agreed to proceed.  HPI: Complains of increased anxiety of late.   Anxiety increased after she put in her 2 week notice to leave work. She is working from home . She is trying to finish up work tasks and she is worried about how coworkers will treat her with leaving. She leaveswork in 2 days.  She is leaving work due to poor communication, Insurance account manager.  She feels that she will feel relieved when she is no longer in this workplace  Compliant with Abilify  She took one dose of atarax 25mg  at bedtime ; she slept well.   Denies SI/HI.   Follows with Dr Vanetta Shawl for bipolar, RLS Topamax increased to 50 mg at night.    ROS: See pertinent positives and negatives per HPI.  EXAM:  VITALS per patient if applicable: Ht 5\' 2"  (1.575 m)   Wt 220 lb 1.6 oz (99.8 kg)   LMP 12/17/2014   BMI 40.26 kg/m  BP Readings from Last 3 Encounters:  07/24/23 132/77  07/14/23 130/78  03/02/23 138/80   Wt Readings from Last 3 Encounters:  08/29/23 220 lb 1.6 oz (99.8 kg)  07/14/23 220 lb 6.4 oz (100 kg)  03/02/23 220 lb (99.8 kg)      08/29/2023    8:09 AM 07/14/2023    9:37 AM 03/02/2023    4:15 PM  Depression screen PHQ 2/9  Decreased Interest 0 0 0  Down, Depressed, Hopeless 0 0 0  PHQ - 2 Score 0 0 0  Altered sleeping   2  Tired, decreased energy   3  Change in appetite   3  Feeling bad or failure about yourself    0  Trouble concentrating   3  Moving slowly or fidgety/restless   0  Suicidal thoughts   0  PHQ-9  Score   11  Difficult doing work/chores   Somewhat difficult    GENERAL: alert, oriented, appears well and in no acute distress  HEENT: atraumatic, conjunttiva clear, no obvious abnormalities on inspection of external nose and ears  NECK: normal movements of the head and neck  LUNGS: on inspection no signs of respiratory distress, breathing rate appears normal, no obvious gross SOB, gasping or wheezing  CV: no obvious cyanosis  MS: moves all visible extremities without noticeable abnormality  PSYCH/NEURO: pleasant and cooperative, no obvious depression or anxiety, speech and thought processing grossly intact  ASSESSMENT AND PLAN: Situational anxiety Assessment & Plan: Exacerbated due to patient giving her 2-week notice to current employer.  Advised to increase to atarax 12.5 mg twice daily during the day; continue atarax 25mg  at bedtime.  She will let me know how she is doing.  Continue follow-up with psychiatry      -we discussed possible serious and likely etiologies, options for evaluation and workup, limitations of telemedicine visit vs in person visit, treatment, treatment risks and precautions. Pt prefers to treat via telemedicine empirically rather then risking or undertaking an in person visit at this moment.    I discussed the assessment and treatment plan with  the patient. The patient was provided an opportunity to ask questions and all were answered. The patient agreed with the plan and demonstrated an understanding of the instructions.   The patient was advised to call back or seek an in-person evaluation if the symptoms worsen or if the condition fails to improve as anticipated.  Advised if desired AVS can be mailed or viewed via MyChart if Mychart user.   Rennie Plowman, FNP

## 2023-08-29 NOTE — Assessment & Plan Note (Signed)
 Exacerbated due to patient giving her 2-week notice to current employer.  Advised to increase to atarax 12.5 mg twice daily during the day; continue atarax 25mg  at bedtime.  She will let me know how she is doing.  Continue follow-up with psychiatry

## 2023-08-30 ENCOUNTER — Telehealth: Payer: Self-pay | Admitting: Psychiatry

## 2023-08-30 NOTE — Telephone Encounter (Signed)
 ekg order faxed and confirmed. to [phone]

## 2023-08-30 NOTE — Telephone Encounter (Signed)
 Patient states she called for EKG but they had no order. Patient was told it needed to be faxed  to [phone] and then let her know so she can call to get an appointment this week.

## 2023-08-30 NOTE — Telephone Encounter (Signed)
 The order has been placed in Feb. Could one of you please contact them to confirm receipt? Thanks.

## 2023-09-04 ENCOUNTER — Encounter: Payer: Self-pay | Admitting: Family

## 2023-10-05 ENCOUNTER — Ambulatory Visit: Payer: Managed Care, Other (non HMO) | Admitting: Psychiatry

## 2023-10-12 ENCOUNTER — Telehealth: Payer: Managed Care, Other (non HMO) | Admitting: Family

## 2023-10-19 ENCOUNTER — Other Ambulatory Visit: Payer: Self-pay | Admitting: Family

## 2023-10-19 DIAGNOSIS — G2581 Restless legs syndrome: Secondary | ICD-10-CM

## 2023-10-19 NOTE — Telephone Encounter (Unsigned)
 Copied from CRM [id number]. Topic: Clinical - Medication Question >> Oct 19, 2023  2:38 PM Allyne Areola wrote: Reason for CRM: Patient is calling to follow up on a refill request her pharmacy submitted for pregabalin  (LYRICA ) 50 MG capsule, she would like to know if there is any way to expedite the refill process because she is completely out of the medication.

## 2023-10-20 ENCOUNTER — Other Ambulatory Visit: Payer: Self-pay

## 2023-10-20 DIAGNOSIS — G2581 Restless legs syndrome: Secondary | ICD-10-CM

## 2023-10-20 NOTE — Addendum Note (Signed)
 Addended by: Chadwick Colonel on: 10/20/2023 05:10 PM   Modules accepted: Orders

## 2023-10-20 NOTE — Telephone Encounter (Signed)
 Refilled: 07/14/2023 Last OV: 07/14/2023 Next OV: not scheduled

## 2023-10-21 ENCOUNTER — Other Ambulatory Visit: Payer: Self-pay | Admitting: Internal Medicine

## 2023-10-21 DIAGNOSIS — G2581 Restless legs syndrome: Secondary | ICD-10-CM

## 2023-10-21 MED ORDER — PREGABALIN 50 MG PO CAPS
50.0000 mg | ORAL_CAPSULE | Freq: Three times a day (TID) | ORAL | 2 refills | Status: DC
Start: 2023-10-21 — End: 2023-10-21

## 2023-10-21 MED ORDER — PREGABALIN 50 MG PO CAPS: 50.0000 mg | ORAL_CAPSULE | Freq: Three times a day (TID) | ORAL | 2 refills | Status: DC
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
## 2023-11-03 ENCOUNTER — Telehealth: Payer: Self-pay | Admitting: Psychiatry

## 2023-11-03 NOTE — Telephone Encounter (Signed)
 PT called to cancel her 11/06/23 follow up due to no longer being In Network for [employer]. She states that she will now go to [facility] for medications.

## 2023-11-06 ENCOUNTER — Ambulatory Visit: Admitting: Psychiatry

## 2024-05-13 ENCOUNTER — Encounter: Payer: Self-pay | Admitting: Family

## 2024-06-17 ENCOUNTER — Ambulatory Visit: Payer: Self-pay | Admitting: Family

## 2024-06-17 ENCOUNTER — Telehealth: Payer: Self-pay | Admitting: Family

## 2024-06-17 VITALS — BP 130/70 | HR 78 | Temp 98.2°F | Ht 62.0 in | Wt 187.2 lb

## 2024-06-17 DIAGNOSIS — F3113 Bipolar disorder, current episode manic without psychotic features, severe: Secondary | ICD-10-CM | POA: Diagnosis not present

## 2024-06-17 DIAGNOSIS — F418 Other specified anxiety disorders: Secondary | ICD-10-CM | POA: Diagnosis not present

## 2024-06-17 DIAGNOSIS — Z136 Encounter for screening for cardiovascular disorders: Secondary | ICD-10-CM

## 2024-06-17 DIAGNOSIS — G2581 Restless legs syndrome: Secondary | ICD-10-CM | POA: Diagnosis not present

## 2024-06-17 DIAGNOSIS — Z1231 Encounter for screening mammogram for malignant neoplasm of breast: Secondary | ICD-10-CM

## 2024-06-17 DIAGNOSIS — R7303 Prediabetes: Secondary | ICD-10-CM

## 2024-06-17 DIAGNOSIS — E88819 Insulin resistance, unspecified: Secondary | ICD-10-CM

## 2024-06-17 DIAGNOSIS — Z13220 Encounter for screening for lipoid disorders: Secondary | ICD-10-CM

## 2024-06-17 LAB — LIPID PANEL
Cholesterol: 186 mg/dL (ref 28–200)
HDL: 52.8 mg/dL
LDL Cholesterol: 95 mg/dL (ref 10–99)
NonHDL: 132.73
Total CHOL/HDL Ratio: 4
Triglycerides: 191 mg/dL — ABNORMAL HIGH (ref 10.0–149.0)
VLDL: 38.2 mg/dL (ref 0.0–40.0)

## 2024-06-17 LAB — B12 AND FOLATE PANEL
Folate: 15.8 ng/mL
Vitamin B-12: 293 pg/mL (ref 211–911)

## 2024-06-17 MED ORDER — HYDROXYZINE HCL 10 MG PO TABS: 10.0000 mg | ORAL_TABLET | Freq: Two times a day (BID) | ORAL | 0 refills | Status: AC | PRN

## 2024-06-17 MED ORDER — TOPIRAMATE 25 MG PO TABS: 25.0000 mg | ORAL_TABLET | Freq: Every day | ORAL | 0 refills | Status: AC

## 2024-06-17 MED ORDER — PREGABALIN 50 MG PO CAPS: 50.0000 mg | ORAL_CAPSULE | Freq: Two times a day (BID) | ORAL | 2 refills | Status: AC

## 2024-06-17 NOTE — Telephone Encounter (Signed)
 Call patient Dr. Vickey, psychiatry recommended Atarax  which she has taken before for anxiety ahead of flight and during her flight to [place] .  I have sent this in for her

## 2024-06-17 NOTE — Patient Instructions (Addendum)
 Call [facility] and see if they cover weight loss medications such as wegovy, zepbound ( is covered for moderate to severe sleep apnea)  Let me know if you would like to repeat sleep study ; this is done through pulmonology  Trial topamax  25mg  at bedtime for 3-6 weeks and then may increase topamax  to 50mg  at bedtime    Decrease lyrica  to 50mg  twice daily as concerned with the sedation on higher doses   Nice to see you!

## 2024-06-17 NOTE — Progress Notes (Signed)
 "  Assessment & Plan:  Insulin  resistance -     Topiramate ; Take 1 tablet (25 mg total) by mouth at bedtime.  Dispense: 90 tablet; Refill: 0  Bipolar disorder, current episode manic without psychotic features, severe (HCC)  Restless leg syndrome, uncontrolled -     Ambulatory referral to Hematology / Oncology -     B12 and Folate Panel; Future -     Pregabalin ; Take 1 capsule (50 mg total) by mouth 2 (two) times daily.  Dispense: 60 capsule; Refill: 2  Encounter for screening mammogram for malignant neoplasm of breast -     3D Screening Mammogram, Left and Right; Future  Encounter for lipid screening for cardiovascular disease -     Lipid panel; Future  Situational anxiety Assessment & Plan: Collaborated with psychiatry, Dr. Vickey.  We agreed to trial Atarax  ; start 10 mg twice daily as needed ahead of flight.  She has follow-up Dr Vickey this month.  Will follow  Orders: -     hydrOXYzine  HCl; Take 1 tablet (10 mg total) by mouth 2 (two) times daily as needed for anxiety.  Dispense: 30 tablet; Refill: 0  Restless leg syndrome Assessment & Plan: Reviewed consult from [facility] neurology. Chronic symptoms remain refractory to treatment. Ferritin level is 43.  Referral to hematology for consideration of iron infusion.  Continue Lyrica  50 mg twice daily which I have refilled today.  Counseled extensively on not increasing Lyrica  if she has felt unsteady or overmedicated on medication.  Counseled on sedation.   Prediabetes Assessment & Plan: Insulin  resistance , weight gain due to antipsychotic. Managed with lifestyle modifications, she expresses interest in stronger medication for weight management. Topamax  25 mg is prescribed at bedtime for 3-6 weeks, with a possible increase to 50 mg at bedtime. Consider oral Wegovy if not effective.  Consider Zepbound if she agrees to have repeat sleep study.      Return precautions given.   Risks, benefits, and alternatives of the medications and  treatment plan prescribed today were discussed, and patient expressed understanding.   Education regarding symptom management and diagnosis given to patient on AVS either electronically or printed.  Return in about 3 months (around 09/15/2024).  Rollene Northern, FNP  Subjective:    Patient ID: [name], female    DOB: [birth year], 55 y.o.   MRN: [id number]  CC: [name] is a 54 y.o. female who presents today for follow up.   HPI: HPI Discussed the use of AI scribe software for clinical note transcription with the patient, who gave verbal consent to proceed.  History of Present Illness   [name] is a 54 year old female with refractory restless leg syndrome who presents for follow-up.  She continues to experience significant issues with restless leg syndrome, describing the sensations as 'crazy' and disruptive to her life. She has tried various treatments, including Lyrica  and iron supplements, without consistent relief. Coffee sometimes alleviates her symptoms but disrupts her sleep. She takes Lyrica  50 mg in the afternoon and 100 mg at bedtime but experiences side effects such feeling unsteady, leading her to reduce the dose. She has stopped taking iron supplements due to lack of efficacy.  She has lost 45 pounds through walking and meal prepping, currently weighing 185 pounds. She previously used Topamax  for weight management but discontinued it. She is interested in resuming Topamax  to aid further weight loss and improve sleep.  She would need new prescription today.  She mentions a  past concern with a palpable area in her left breast, which was evaluated with an ultrasound in April 2024, showing no concerning masses. She has dense breast tissue.  Denies palpable breast mass, nipple discharge or dimpling is present.  She would like screening mammogram ordered.  She experiences fatigue, which she attributes to restless leg syndrome and possibly vitamin deficiencies. She  drinks coffee to combat fatigue and is considering checking her vitamin B12 levels. She is currently on Abilify  as managed by Dr. Hisada and has difficulty remembering to take it, preferring injectable forms for better compliance.  She has follow-up with psychiatry this month.   she also uses a CPAP machine but notes improvement in her symptoms with weight loss, questioning the need for a new sleep study.       She requests medication for anxiety when flying to [place] .     Follow-up neurology 04/03/2024 for OSA, RLS  RLS - Her symptomatology suggest augmentation despite unclear duration of dopamine agonist use. She has not maximized Lyrica  at this time which may provide her benefit. Iron supplementation would also provide her benefit. Untreated OSA may lead to arousals which she will have a hard time falling back asleep from.  OSA - With her weight loss she may have improvement of OSA, but this may not be resolved. She may benefit from a repeat in-lab study.  Plan:   Continue magnesium  glycinate  Iron infusion - can continue oral supplementation until then (Vitron C is an option). You can have this ordered by your primary care provider, otherwise we can order this through [facility]  Warm leg bath, massage or stretch 30 minutes before bedtime Try to keep more consistent sleep time on your non work days  Continue daytime walking and exercise  Limit caffeine Take Abilify  in the morning  Increase Lyrica  to 50 mg at 1 pm and 150 mg 1 hour prior to bedtime  Will order APAP with pressure 5 - 20 cm H20           Allergies: Patient has no known allergies. Medications Ordered Prior to Encounter[1]  Review of Systems  Constitutional:  Negative for chills and fever.  Respiratory:  Negative for cough.   Cardiovascular:  Negative for chest pain and palpitations.  Gastrointestinal:  Negative for nausea and vomiting.  Neurological:  Negative for dizziness and numbness.      Objective:     BP 130/70   Pulse 78   Temp 98.2 F (36.8 C) (Oral)   Ht 5' 2 (1.575 m)   Wt 187 lb 3.2 oz (84.9 kg)   LMP 12/17/2014   SpO2 98%   BMI 34.24 kg/m  BP Readings from Last 3 Encounters:  06/17/24 130/70  07/24/23 132/77  07/14/23 130/78   Wt Readings from Last 3 Encounters:  06/17/24 187 lb 3.2 oz (84.9 kg)  08/29/23 220 lb 1.6 oz (99.8 kg)  07/14/23 220 lb 6.4 oz (100 kg)   Lab Results  Component Value Date   HGBA1C 5.9 (H) 03/03/2023    Physical Exam Vitals reviewed.  Constitutional:      Appearance: She is well-developed.  Eyes:     Conjunctiva/sclera: Conjunctivae normal.  Cardiovascular:     Rate and Rhythm: Normal rate and regular rhythm.     Pulses: Normal pulses.     Heart sounds: Normal heart sounds.  Pulmonary:     Effort: Pulmonary effort is normal.     Breath sounds: Normal breath sounds. No wheezing, rhonchi or  rales.  Skin:    General: Skin is warm and dry.  Neurological:     Mental Status: She is alert.  Psychiatric:        Speech: Speech normal.        Behavior: Behavior normal.        Thought Content: Thought content normal.            [1]  Current Outpatient Medications on File Prior to Visit  Medication Sig Dispense Refill   ARIPiprazole  (ABILIFY ) 20 MG tablet Take 1 tablet (20 mg total) by mouth daily. 90 tablet 0   ibuprofen  (ADVIL ) 600 MG tablet Take 600 mg by mouth every 8 (eight) hours as needed for mild pain.     MAGNESIUM  CHLORIDE PO Take by mouth. Magnesium  drops     mupirocin  ointment (BACTROBAN ) 2 % Apply 1 Application topically 2 (two) times daily. 22 g 1   albuterol  (VENTOLIN  HFA) 108 (90 Base) MCG/ACT inhaler Inhale 1-2 puffs into the lungs every 6 (six) hours as needed. (Patient not taking: Reported on 06/17/2024) 18 g 0   No current facility-administered medications on file prior to visit.   "

## 2024-06-18 NOTE — Telephone Encounter (Signed)
 Spoke to pt she states that she is really not enthused about the Atarax  she compares it to Benadryl  pt words exactly she was thinking more along the lines of Klonopin 

## 2024-06-21 ENCOUNTER — Ambulatory Visit: Payer: Self-pay | Admitting: Family

## 2024-06-21 ENCOUNTER — Inpatient Hospital Stay

## 2024-06-21 ENCOUNTER — Encounter: Payer: Self-pay | Admitting: Oncology

## 2024-06-21 ENCOUNTER — Inpatient Hospital Stay: Attending: Oncology | Admitting: Oncology

## 2024-06-21 VITALS — BP 124/76 | HR 86 | Temp 97.8°F | Resp 18 | Ht 62.0 in | Wt 190.0 lb

## 2024-06-21 DIAGNOSIS — F5104 Psychophysiologic insomnia: Secondary | ICD-10-CM | POA: Diagnosis not present

## 2024-06-21 DIAGNOSIS — D509 Iron deficiency anemia, unspecified: Secondary | ICD-10-CM | POA: Insufficient documentation

## 2024-06-21 DIAGNOSIS — Z9049 Acquired absence of other specified parts of digestive tract: Secondary | ICD-10-CM | POA: Insufficient documentation

## 2024-06-21 DIAGNOSIS — E538 Deficiency of other specified B group vitamins: Secondary | ICD-10-CM

## 2024-06-21 DIAGNOSIS — Z79899 Other long term (current) drug therapy: Secondary | ICD-10-CM | POA: Insufficient documentation

## 2024-06-21 DIAGNOSIS — F17210 Nicotine dependence, cigarettes, uncomplicated: Secondary | ICD-10-CM | POA: Insufficient documentation

## 2024-06-21 DIAGNOSIS — G2581 Restless legs syndrome: Secondary | ICD-10-CM | POA: Diagnosis not present

## 2024-06-21 LAB — CBC (CANCER CENTER ONLY)
HCT: 42.4 % (ref 36.0–46.0)
Hemoglobin: 14.2 g/dL (ref 12.0–15.0)
MCH: 30 pg (ref 26.0–34.0)
MCHC: 33.5 g/dL (ref 30.0–36.0)
MCV: 89.6 fL (ref 80.0–100.0)
Platelet Count: 299 K/uL (ref 150–400)
RBC: 4.73 MIL/uL (ref 3.87–5.11)
RDW: 13.4 % (ref 11.5–15.5)
WBC Count: 9.3 K/uL (ref 4.0–10.5)
nRBC: 0 % (ref 0.0–0.2)

## 2024-06-21 LAB — IRON AND TIBC
Iron: 94 ug/dL (ref 28–170)
Saturation Ratios: 25 % (ref 10.4–31.8)
TIBC: 378 ug/dL (ref 250–450)
UIBC: 284 ug/dL

## 2024-06-21 LAB — FERRITIN: Ferritin: 99 ng/mL (ref 11–307)

## 2024-06-21 NOTE — Telephone Encounter (Signed)
 Spoke to pt she stated that she has spoken to Dr Vickey in regards to medication they will handle it

## 2024-06-21 NOTE — Progress Notes (Signed)
 Patient has been struggling with restless leg syndrome, her [facility] provider thinks that her getting iron infusions could help.

## 2024-06-21 NOTE — Assessment & Plan Note (Signed)
 Collaborated with psychiatry, Dr. Vickey.  We agreed to trial Atarax  ; start 10 mg twice daily as needed ahead of flight.  She has follow-up Dr Vickey this month.  Will follow

## 2024-06-21 NOTE — Progress Notes (Signed)
 " [facility]  [phone] [phone]  ID: Powell SHAUNNA Kast OB: 1971-01-04  MR#: [id number]  RDW#:[id number]  Patient Care Team: Dineen Rollene MATSU, FNP as PCP - General (Family Medicine)  CHIEF COMPLAINT: Iron deficiency anemia.  INTERVAL HISTORY: Patient is a 54 year old female with a history of iron deficiency anemia is referred for further evaluation and treatment.  She has chronic insomnia and fatigue secondary to restless leg syndrome, but otherwise feels well.  She has no other neurologic complaints.  She denies any recent fevers or illnesses.  She has a good appetite and denies weight loss.  She has no chest pain, shortness of breath, cough, or hemoptysis.  She denies any nausea, vomiting, constipation, or diarrhea.  She has no melena or hematochezia.  She has no urinary complaints.  Patient offers no further specific complaints today.  REVIEW OF SYSTEMS:   Review of Systems  Constitutional:  Positive for malaise/fatigue. Negative for fever and weight loss.  Respiratory: Negative.  Negative for cough, hemoptysis and shortness of breath.   Cardiovascular: Negative.  Negative for chest pain and leg swelling.  Gastrointestinal: Negative.  Negative for abdominal pain, blood in stool and melena.  Genitourinary: Negative.  Negative for dysuria.  Musculoskeletal: Negative.  Negative for back pain.  Skin: Negative.  Negative for rash.  Neurological: Negative.  Negative for dizziness, focal weakness, weakness and headaches.  Psychiatric/Behavioral:  The patient has insomnia.     As per HPI. Otherwise, a complete review of systems is negative.  PAST MEDICAL HISTORY: Past Medical History:  Diagnosis Date   Akathisia 12/17/2014   Bipolar affective (HCC)    Restless leg syndrome    Schizoaffective disorder (HCC)    Schizoaffective disorder (HCC)     PAST SURGICAL HISTORY: Past Surgical History:  Procedure Laterality Date   APPENDECTOMY      CESAREAN SECTION     COLONOSCOPY WITH PROPOFOL  N/A 01/28/2021   Procedure: COLONOSCOPY WITH PROPOFOL ;  Surgeon: Jinny Carmine, MD;  Location: [facility] ENDOSCOPY;  Service: Endoscopy;  Laterality: N/A;   LAPAROSCOPIC APPENDECTOMY N/A 07/02/2020   Procedure: APPENDECTOMY LAPAROSCOPIC;  Surgeon: Marolyn Nest, MD;  Location: [facility] ORS;  Service: General;  Laterality: N/A;    FAMILY HISTORY: Family History  Problem Relation Age of Onset   Drug abuse Maternal Aunt    Suicidality Cousin     ADVANCED DIRECTIVES (Y/N):  N  HEALTH MAINTENANCE: Social History[1]   Colonoscopy:  PAP:  Bone density:  Lipid panel:  Allergies[2]  Current Outpatient Medications  Medication Sig Dispense Refill   ARIPiprazole  (ABILIFY ) 20 MG tablet Take 1 tablet (20 mg total) by mouth daily. 90 tablet 0   ibuprofen  (ADVIL ) 600 MG tablet Take 600 mg by mouth every 8 (eight) hours as needed for mild pain.     MAGNESIUM  CHLORIDE PO Take by mouth. Magnesium  drops     mupirocin  ointment (BACTROBAN ) 2 % Apply 1 Application topically 2 (two) times daily. 22 g 1   topiramate  (TOPAMAX ) 25 MG tablet Take 1 tablet (25 mg total) by mouth at bedtime. 90 tablet 0   albuterol  (VENTOLIN  HFA) 108 (90 Base) MCG/ACT inhaler Inhale 1-2 puffs into the lungs every 6 (six) hours as needed. (Patient not taking: Reported on 06/21/2024) 18 g 0   hydrOXYzine  (ATARAX ) 10 MG tablet Take 1 tablet (10 mg total) by mouth 2 (two) times daily as needed for anxiety. (Patient not taking: Reported on 06/21/2024) 30 tablet 0   pregabalin  (LYRICA ) 50 MG capsule  Take 1 capsule (50 mg total) by mouth 2 (two) times daily. 60 capsule 2   No current facility-administered medications for this visit.    OBJECTIVE: Vitals:   06/21/24 1139  BP: 124/76  Pulse: 86  Resp: 18  Temp: 97.8 F (36.6 C)  SpO2: 97%     Body mass index is 34.75 kg/m.    ECOG FS:0 - Asymptomatic  General: Well-developed, well-nourished, no acute distress. Eyes: Pink conjunctiva,  anicteric sclera. HEENT: Normocephalic, moist mucous membranes. Lungs: No audible wheezing or coughing. Heart: Regular rate and rhythm. Abdomen: Soft, nontender, no obvious distention. Musculoskeletal: No edema, cyanosis, or clubbing. Neuro: Alert, answering all questions appropriately. Cranial nerves grossly intact. Skin: No rashes or petechiae noted. Psych: Normal affect. Lymphatics: No cervical, calvicular, axillary or inguinal LAD.   LAB RESULTS:  Lab Results  Component Value Date   NA 141 12/23/2022   K 4.1 12/23/2022   CL 107 12/23/2022   CO2 27 12/23/2022   GLUCOSE 95 12/23/2022   BUN 18 12/23/2022   CREATININE 0.72 12/23/2022   CALCIUM 9.3 12/23/2022   PROT 7.7 08/27/2022   ALBUMIN 4.2 08/27/2022   AST 25 08/27/2022   ALT 32 08/27/2022   ALKPHOS 92 08/27/2022   BILITOT 0.6 08/27/2022   GFRNONAA >60 12/23/2022   GFRAA >60 12/17/2014    Lab Results  Component Value Date   WBC 9.3 06/21/2024   NEUTROABS 3.3 12/23/2022   HGB 14.2 06/21/2024   HCT 42.4 06/21/2024   MCV 89.6 06/21/2024   PLT 299 06/21/2024     STUDIES: No results found.  ASSESSMENT: Iron deficiency anemia.  PLAN:    Iron deficiency anemia: Patient's hemoglobin is within normal limits at 14.2.  Patient previously noted to have decreased iron stores.  B12 and folate are within normal limits.  Will proceed with 200 mg IV Venofer x 4 over the next 1 to 2 weeks.  Patient will then return to clinic in 3 months with repeat laboratory, further evaluation, and continuation of treatment if needed. Restless leg/insomnia: Likely unrelated to iron deficiency.  Continue follow-up with neurology as scheduled.  I spent a total of 45 minutes reviewing chart data, face-to-face evaluation with the patient, counseling and coordination of care as detailed above.   Patient expressed understanding and was in agreement with this plan. She also understands that She can call clinic at any time with any questions,  concerns, or complaints.    Evalene JINNY Reusing, MD   06/21/2024 12:47 PM        [1]  Social History Tobacco Use   Smoking status: Every Day    Current packs/day: 0.50    Average packs/day: 0.5 packs/day for 10.0 years (5.0 ttl pk-yrs)    Types: Cigarettes   Smokeless tobacco: Never   Tobacco comments:    Patient stated she is thinking about quitting and in contemplation..   Vaping Use   Vaping status: Former  Substance Use Topics   Alcohol use: No   Drug use: No  [2] No Known Allergies  "

## 2024-06-21 NOTE — Assessment & Plan Note (Addendum)
 Insulin  resistance , weight gain due to antipsychotic. Managed with lifestyle modifications, she expresses interest in stronger medication for weight management. Topamax  25 mg is prescribed at bedtime for 3-6 weeks, with a possible increase to 50 mg at bedtime. Consider oral Wegovy if not effective.  Consider Zepbound if she agrees to have repeat sleep study.

## 2024-06-21 NOTE — Assessment & Plan Note (Signed)
 Reviewed consult from [facility] neurology. Chronic symptoms remain refractory to treatment. Ferritin level is 43.  Referral to hematology for consideration of iron infusion.  Continue Lyrica  50 mg twice daily which I have refilled today.  Counseled extensively on not increasing Lyrica  if she has felt unsteady or overmedicated on medication.  Counseled on sedation.

## 2024-06-25 ENCOUNTER — Encounter: Payer: Self-pay | Admitting: Oncology

## 2024-06-25 ENCOUNTER — Inpatient Hospital Stay

## 2024-06-26 ENCOUNTER — Other Ambulatory Visit: Payer: Self-pay | Admitting: Family

## 2024-06-26 ENCOUNTER — Other Ambulatory Visit

## 2024-06-26 DIAGNOSIS — E538 Deficiency of other specified B group vitamins: Secondary | ICD-10-CM

## 2024-06-27 ENCOUNTER — Inpatient Hospital Stay

## 2024-06-27 LAB — CELIAC DISEASE AB SCREEN W/RFX
Deamidated Gliadin Abs, IgA: 3 U (ref 0–19)
Immunoglobulin A, (IgA) QN, Serum: 149 mg/dL (ref 87–352)
t-Transglutaminase (tTG) IgA: <2 U/mL (ref 0–3)

## 2024-06-28 LAB — INTRINSIC FACTOR ANTIBODIES: Intrinsic Factor Abs, Serum: 1.1 [AU]/ml (ref 0.0–1.1)
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
## 2024-06-30 LAB — HOMOCYSTEINE: Homocysteine: 11.7 umol/L

## 2024-06-30 LAB — METHYLMALONIC ACID, SERUM: Methylmalonic Acid, Quant: 96 nmol/L (ref 55–335)

## 2024-06-30 LAB — ANTI-PARIETAL ANTIBODY: PARIETAL CELL AB SCREEN: NEGATIVE

## 2024-07-01 ENCOUNTER — Ambulatory Visit: Payer: Self-pay | Admitting: Family

## 2024-07-01 ENCOUNTER — Inpatient Hospital Stay

## 2024-07-01 DIAGNOSIS — E538 Deficiency of other specified B group vitamins: Secondary | ICD-10-CM | POA: Insufficient documentation

## 2024-07-02 ENCOUNTER — Encounter: Payer: Self-pay | Admitting: Psychiatry

## 2024-07-02 ENCOUNTER — Telehealth: Payer: Self-pay | Admitting: Psychiatry

## 2024-07-02 DIAGNOSIS — F3171 Bipolar disorder, in partial remission, most recent episode hypomanic: Secondary | ICD-10-CM

## 2024-07-02 DIAGNOSIS — F4310 Post-traumatic stress disorder, unspecified: Secondary | ICD-10-CM | POA: Diagnosis not present

## 2024-07-02 DIAGNOSIS — T43505A Adverse effect of unspecified antipsychotics and neuroleptics, initial encounter: Secondary | ICD-10-CM

## 2024-07-02 DIAGNOSIS — G2581 Restless legs syndrome: Secondary | ICD-10-CM

## 2024-07-02 DIAGNOSIS — R635 Abnormal weight gain: Secondary | ICD-10-CM

## 2024-07-02 DIAGNOSIS — F411 Generalized anxiety disorder: Secondary | ICD-10-CM | POA: Diagnosis not present

## 2024-07-02 MED ORDER — LORAZEPAM 0.5 MG PO TABS: 0.5000 mg | ORAL_TABLET | Freq: Three times a day (TID) | ORAL | 0 refills | Status: AC | PRN

## 2024-07-02 MED ORDER — ARIPIPRAZOLE 20 MG PO TABS: 20.0000 mg | ORAL_TABLET | Freq: Every day | ORAL | 0 refills | Status: AC

## 2024-07-02 NOTE — Patient Instructions (Addendum)
 Continue Abilify  20 mg daily  Continue topiramate  25 mg daily  Start lorazepam  0.5-1 mg, every 8 hours as needed for anxiety, total of 0.5 mg 6 tabs only for upcoming trip Next appointment: 3/17 at 1 pm

## 2024-07-05 ENCOUNTER — Inpatient Hospital Stay

## 2024-08-20 ENCOUNTER — Ambulatory Visit: Payer: Self-pay | Admitting: Psychiatry

## 2024-09-17 ENCOUNTER — Encounter

## 2024-09-17 ENCOUNTER — Ambulatory Visit: Admitting: Family

## 2024-09-24 ENCOUNTER — Inpatient Hospital Stay

## 2024-09-25 ENCOUNTER — Inpatient Hospital Stay: Admitting: Oncology

## 2024-09-25 ENCOUNTER — Inpatient Hospital Stay
